# Patient Record
Sex: Male | Born: 1960 | Race: White | Hispanic: No | Marital: Married | Smoking: Former smoker
Health system: Southern US, Community
[De-identification: ages and names within clinical notes are randomized; demographics above are authoritative.]

## PROBLEM LIST (undated history)

## (undated) DIAGNOSIS — C801 Malignant (primary) neoplasm, unspecified: Secondary | ICD-10-CM

## (undated) DIAGNOSIS — L814 Other melanin hyperpigmentation: Secondary | ICD-10-CM

## (undated) DIAGNOSIS — I4892 Unspecified atrial flutter: Secondary | ICD-10-CM

## (undated) DIAGNOSIS — R109 Unspecified abdominal pain: Secondary | ICD-10-CM

## (undated) DIAGNOSIS — C189 Malignant neoplasm of colon, unspecified: Secondary | ICD-10-CM

## (undated) DIAGNOSIS — R634 Abnormal weight loss: Secondary | ICD-10-CM

## (undated) DIAGNOSIS — L0390 Cellulitis, unspecified: Secondary | ICD-10-CM

## (undated) DIAGNOSIS — R0600 Dyspnea, unspecified: Secondary | ICD-10-CM

## (undated) DIAGNOSIS — T7840XA Allergy, unspecified, initial encounter: Secondary | ICD-10-CM

## (undated) DIAGNOSIS — R195 Other fecal abnormalities: Secondary | ICD-10-CM

## (undated) HISTORY — DX: Allergy, unspecified, initial encounter: T78.40XA

## (undated) HISTORY — DX: Cellulitis, unspecified: L03.90

## (undated) HISTORY — DX: Malignant neoplasm of colon, unspecified: C18.9

## (undated) HISTORY — PX: OTHER SURGICAL HISTORY: SHX169

---
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
## 2018-12-11 ENCOUNTER — Encounter: Payer: Self-pay | Admitting: Adult Health

## 2018-12-11 ENCOUNTER — Other Ambulatory Visit: Payer: Self-pay

## 2018-12-11 ENCOUNTER — Ambulatory Visit: Payer: No Typology Code available for payment source | Admitting: Adult Health

## 2018-12-11 VITALS — BP 136/78 | HR 81 | Resp 16 | Ht 76.0 in | Wt 224.0 lb

## 2018-12-11 DIAGNOSIS — R5383 Other fatigue: Secondary | ICD-10-CM | POA: Diagnosis not present

## 2018-12-11 DIAGNOSIS — R1010 Upper abdominal pain, unspecified: Secondary | ICD-10-CM | POA: Diagnosis not present

## 2018-12-11 DIAGNOSIS — R6881 Early satiety: Secondary | ICD-10-CM | POA: Diagnosis not present

## 2018-12-11 NOTE — Progress Notes (Signed)
[facility] [place], [place] [address]  Internal MEDICINE  Office Visit Note  Patient Name: Keith Trujillo  [id number]  [id number]  Date of Service: 12/11/2018   Complaints/HPI Pt is here for establishment of PCP. Chief Complaint  Patient presents with  . Abdominal Pain    after eating having some belly issues and not consistent bowels , been going late june early july , been using immodium , probiotics ,   . New Patient (Initial Visit)    establish care,   . Quality Metric Gaps    colonscopy    HPI Pt is here to establish care.  He is an [employer] [place] native.  He has only really seen urgent care doctors. Feels like its time to establish care. Pt reports he does not take any recurrent medications.  Earlier this year, he had leg cellulitis, and then a tooth abscess.  He reports ongoing gi issues including constipation, early satiety,  and abdominal pain.  This has become worse in the last 4 months, and given his abscense of medical care in the last decade, he decided now was the time to get it checked out. He has been taking multiple otc medications for the past few months.    Current Medication: Outpatient Encounter Medications as of 12/11/2018  Medication Sig  . Lactobacillus-Inulin ([place]) Take by mouth.  . Loperamide-Simethicone 2-125 MG TABS Take by mouth.  . loratadine (ALLERGY RELIEF) 10 MG tablet Take 10 mg by mouth daily.  . Multiple Vitamins-Minerals (CENTRUM SILVER PO) Take by mouth.   No facility-administered encounter medications on file as of 12/11/2018.     Surgical History: Past Surgical History:  Procedure Laterality Date  . TONSILLECTOMY AND ADENOIDECTOMY  1969    Medical History: Past Medical History:  Diagnosis Date  . Allergy   . Cellulitis     Family History: Family History  Problem Relation Age of Onset  . Lung cancer Father     Social History   Socioeconomic History  . Marital status:  Married    Spouse name: Not on file  . Number of children: Not on file  . Years of education: Not on file  . Highest education level: Not on file  Occupational History  . Not on file  Social Needs  . Financial resource strain: Not on file  . Food insecurity    Worry: Not on file    Inability: Not on file  . Transportation needs    Medical: Not on file    Non-medical: Not on file  Tobacco Use  . Smoking status: Light Tobacco Smoker    Types: Cigars  . Smokeless tobacco: Never Used  Substance and Sexual Activity  . Alcohol use: Yes  . Drug use: Never  . Sexual activity: Not on file  Lifestyle  . Physical activity    Days per week: Not on file    Minutes per session: Not on file  . Stress: Not on file  Relationships  . Social Herbalist on phone: Not on file    Gets together: Not on file    Attends religious service: Not on file    Active member of club or organization: Not on file    Attends meetings of clubs or organizations: Not on file    Relationship status: Not on file  . Intimate partner violence    Fear of current or ex partner: Not on file    Emotionally  abused: Not on file    Physically abused: Not on file    Forced sexual activity: Not on file  Other Topics Concern  . Not on file  Social History Narrative  . Not on file     Review of Systems  Constitutional: Negative.  Negative for chills, fatigue and unexpected weight change.  HENT: Negative.  Negative for congestion, rhinorrhea, sneezing and sore throat.   Eyes: Negative for redness.  Respiratory: Negative.  Negative for cough, chest tightness and shortness of breath.   Cardiovascular: Negative.  Negative for chest pain and palpitations.  Gastrointestinal: Negative.  Negative for abdominal pain, constipation, diarrhea, nausea and vomiting.  Endocrine: Negative.   Genitourinary: Negative.  Negative for dysuria and frequency.  Musculoskeletal: Negative.  Negative for arthralgias, back pain,  joint swelling and neck pain.  Skin: Negative.  Negative for rash.  Allergic/Immunologic: Negative.   Neurological: Negative.  Negative for tremors and numbness.  Hematological: Negative for adenopathy. Does not bruise/bleed easily.  Psychiatric/Behavioral: Negative.  Negative for behavioral problems, sleep disturbance and suicidal ideas. The patient is not nervous/anxious.     Vital Signs: BP 136/78 (BP Location: Left Arm, Patient Position: Sitting, Cuff Size: Normal)   Pulse 81   Resp 16   Ht 6\' 4"  (1.93 m)   Wt 224 lb (101.6 kg)   SpO2 99%   BMI 27.27 kg/m    Physical Exam Vitals signs and nursing note reviewed.  Constitutional:      General: He is not in acute distress.    Appearance: He is well-developed. He is not diaphoretic.  HENT:     Head: Normocephalic and atraumatic.     Mouth/Throat:     Pharynx: No oropharyngeal exudate.  Eyes:     Pupils: Pupils are equal, round, and reactive to light.  Neck:     Musculoskeletal: Normal range of motion and neck supple.     Thyroid: No thyromegaly.     Vascular: No JVD.     Trachea: No tracheal deviation.  Cardiovascular:     Rate and Rhythm: Normal rate and regular rhythm.     Heart sounds: Normal heart sounds. No murmur. No friction rub. No gallop.   Pulmonary:     Effort: Pulmonary effort is normal. No respiratory distress.     Breath sounds: Normal breath sounds. No wheezing or rales.  Chest:     Chest wall: No tenderness.  Abdominal:     Palpations: Abdomen is soft.     Tenderness: There is no abdominal tenderness. There is no guarding.  Musculoskeletal: Normal range of motion.  Lymphadenopathy:     Cervical: No cervical adenopathy.  Skin:    General: Skin is warm and dry.  Neurological:     Mental Status: He is alert and oriented to person, place, and time.     Cranial Nerves: No cranial nerve deficit.  Psychiatric:        Behavior: Behavior normal.        Thought Content: Thought content normal.         Judgment: Judgment normal.    Assessment/Plan: 1. Pain of upper abdomen Pt will have referral to GI.  - Ambulatory referral to Gastroenterology  2. Fatigue, unspecified type Pt given lab slip to check labs.   General Counseling: Keith Trujillo verbalizes understanding of the findings of todays visit and agrees with plan of treatment. I have discussed any further diagnostic evaluation that may be needed or ordered today. We also reviewed his medications  today. he has been encouraged to call the office with any questions or concerns that should arise related to todays visit.  No orders of the defined types were placed in this encounter.   No orders of the defined types were placed in this encounter.   Time spent: 35 Minutes   This patient was seen by Orson Gear AGNP-C in Collaboration with Dr Lavera Guise as a part of collaborative care agreement  Kendell Bane AGNP-C Internal Medicine

## 2018-12-13 ENCOUNTER — Other Ambulatory Visit: Payer: Self-pay | Admitting: Adult Health

## 2018-12-14 LAB — CBC WITH DIFFERENTIAL/PLATELET
Basophils Absolute: 0 10*3/uL (ref 0.0–0.2)
Basos: 1 %
EOS (ABSOLUTE): 0.1 10*3/uL (ref 0.0–0.4)
Eos: 1 %
Hematocrit: 39.4 % (ref 37.5–51.0)
Hemoglobin: 13.1 g/dL (ref 13.0–17.7)
Immature Grans (Abs): 0 10*3/uL (ref 0.0–0.1)
Immature Granulocytes: 0 %
Lymphocytes Absolute: 1.2 10*3/uL (ref 0.7–3.1)
Lymphs: 18 %
MCH: 31.4 pg (ref 26.6–33.0)
MCHC: 33.2 g/dL (ref 31.5–35.7)
MCV: 95 fL (ref 79–97)
Monocytes Absolute: 0.4 10*3/uL (ref 0.1–0.9)
Monocytes: 7 %
Neutrophils Absolute: 4.9 10*3/uL (ref 1.4–7.0)
Neutrophils: 73 %
Platelets: 178 10*3/uL (ref 150–450)
RBC: 4.17 x10E6/uL (ref 4.14–5.80)
RDW: 12.1 % (ref 11.6–15.4)
WBC: 6.6 10*3/uL (ref 3.4–10.8)

## 2018-12-14 LAB — COMPREHENSIVE METABOLIC PANEL
ALT: 16 IU/L (ref 0–44)
AST: 15 IU/L (ref 0–40)
Albumin/Globulin Ratio: 2.2 (ref 1.2–2.2)
Albumin: 4.3 g/dL (ref 3.8–4.9)
Alkaline Phosphatase: 92 IU/L (ref 39–117)
BUN/Creatinine Ratio: 13 (ref 9–20)
BUN: 11 mg/dL (ref 6–24)
Bilirubin Total: 0.4 mg/dL (ref 0.0–1.2)
CO2: 26 mmol/L (ref 20–29)
Calcium: 9.6 mg/dL (ref 8.7–10.2)
Chloride: 104 mmol/L (ref 96–106)
Creatinine, Ser: 0.83 mg/dL (ref 0.76–1.27)
GFR calc Af Amer: 112 mL/min/{1.73_m2} (ref >59)
GFR calc non Af Amer: 97 mL/min/{1.73_m2} (ref >59)
Globulin, Total: 2 g/dL (ref 1.5–4.5)
Glucose: 115 mg/dL — ABNORMAL HIGH (ref 65–99)
Potassium: 5 mmol/L (ref 3.5–5.2)
Sodium: 142 mmol/L (ref 134–144)
Total Protein: 6.3 g/dL (ref 6.0–8.5)

## 2018-12-14 LAB — FERRITIN: Ferritin: 161 ng/mL (ref 30–400)

## 2018-12-14 LAB — LIPID PANEL WITH LDL/HDL RATIO
Cholesterol, Total: 161 mg/dL (ref 100–199)
HDL: 46 mg/dL (ref >39)
LDL Chol Calc (NIH): 101 mg/dL — ABNORMAL HIGH (ref 0–99)
LDL/HDL Ratio: 2.2 ratio (ref 0.0–3.6)
Triglycerides: 72 mg/dL (ref 0–149)
VLDL Cholesterol Cal: 14 mg/dL (ref 5–40)

## 2018-12-14 LAB — TSH: TSH: 2.58 u[IU]/mL (ref 0.450–4.500)

## 2018-12-14 LAB — IRON AND TIBC
Iron Saturation: 28 % (ref 15–55)
Iron: 70 ug/dL (ref 38–169)
Total Iron Binding Capacity: 247 ug/dL — ABNORMAL LOW (ref 250–450)
UIBC: 177 ug/dL (ref 111–343)

## 2018-12-14 LAB — B12 AND FOLATE PANEL
Folate: 15.6 ng/mL (ref >3.0)
Vitamin B-12: 926 pg/mL (ref 232–1245)

## 2018-12-14 LAB — PSA: Prostate Specific Ag, Serum: 0.4 ng/mL (ref 0.0–4.0)

## 2018-12-14 LAB — T4, FREE: Free T4: 1.17 ng/dL (ref 0.82–1.77)

## 2018-12-14 LAB — VITAMIN D 25 HYDROXY (VIT D DEFICIENCY, FRACTURES): Vit D, 25-Hydroxy: 27.8 ng/mL — ABNORMAL LOW (ref 30.0–100.0)

## 2018-12-14 LAB — C-REACTIVE PROTEIN: CRP: 3 mg/L (ref 0–10)

## 2018-12-19 ENCOUNTER — Other Ambulatory Visit: Payer: Self-pay

## 2018-12-19 ENCOUNTER — Encounter: Payer: Self-pay | Admitting: Gastroenterology

## 2018-12-19 ENCOUNTER — Ambulatory Visit (INDEPENDENT_AMBULATORY_CARE_PROVIDER_SITE_OTHER): Payer: 59 | Admitting: Gastroenterology

## 2018-12-19 VITALS — BP 106/67 | HR 67 | Temp 98.0°F | Ht 76.0 in | Wt 217.8 lb

## 2018-12-19 DIAGNOSIS — K591 Functional diarrhea: Secondary | ICD-10-CM

## 2018-12-19 NOTE — Progress Notes (Signed)
Gastroenterology Consultation  Referring Provider:     Kendell Bane, NP Primary Care Physician:  Kendell Bane, NP Primary Gastroenterologist:  Dr. Allen Norris     Reason for Consultation:     Diarrhea        HPI:   Keith Trujillo is a 58 y.o. y/o male referred for consultation & management of diarrhea and abdominal pain by Dr. Versie Starks, Audie Clear, NP.  This patient comes here today because of a history of diarrhea.  The patient reports that the symptoms have increased in the last 4 months.  The patient had not sought medical care for many years and was only following up with our care.  He recently established care with Dr. Humphrey Rolls and due to his ongoing GI symptoms was referred to me for evaluation.  The patient was found to have normal iron and iron saturation with a normal CBC and normal LFTs.  The patient had seen a nurse practitioner and his PCPs office who reported the patient to have constipation with early satiety and bloating.  The patient tells me that he has had diarrhea that started a around the time he took antibiotics.  The patient states that he was treating himself with Imodium and tried IBD guard over-the-counter with the addition of Metamucil which he reports made him more gassy.  He reports that he lost some weight because he felt that the eating was making his diarrhea worse but has stabilized.  The patient was also taking probiotics in addition to the Imodium.  He now reports that his stools are more firm and he is now only taking 1 Imodium when before he was taking 2 a day.  He denies any nausea vomiting fever chills.  Past Medical History:  Diagnosis Date  . Allergy   . Cellulitis     Past Surgical History:  Procedure Laterality Date  . TONSILLECTOMY AND ADENOIDECTOMY  1969    Prior to Admission medications   Medication Sig Start Date End Date Taking? Authorizing Provider  Lactobacillus-Inulin ([place]) Take by mouth.    [provider]   Loperamide-Simethicone 2-125 MG TABS Take by mouth.    [provider]  loratadine (ALLERGY RELIEF) 10 MG tablet Take 10 mg by mouth daily.    [provider]  Multiple Vitamins-Minerals (CENTRUM SILVER PO) Take by mouth.    [provider]    Family History  Problem Relation Age of Onset  . Lung cancer Father      Social History   Tobacco Use  . Smoking status: Light Tobacco Smoker    Types: Cigars  . Smokeless tobacco: Never Used  Substance Use Topics  . Alcohol use: Yes  . Drug use: Never    Allergies as of 12/19/2018 - Review Complete 12/11/2018  Allergen Reaction Noted  . Sulfa antibiotics Rash 04/09/2018    Review of Systems:    All systems reviewed and negative except where noted in HPI.   Physical Exam:  There were no vitals taken for this visit. No LMP for male patient. General:   Alert,  Well-developed, well-nourished, pleasant and cooperative in NAD Head:  Normocephalic and atraumatic. Eyes:  Sclera clear, no icterus.   Conjunctiva pink. Ears:  Normal auditory acuity. Nose:  No deformity, discharge, or lesions. Mouth:  No deformity or lesions,oropharynx pink & moist. Neck:  Supple; no masses or thyromegaly. Lungs:  Respirations even and unlabored.  Clear throughout to auscultation.   No wheezes,  crackles, or rhonchi. No acute distress. Heart:  Regular rate and rhythm; no murmurs, clicks, rubs, or gallops. Abdomen:  Normal bowel sounds.  No bruits.  Soft, non-tender and non-distended without masses, hepatosplenomegaly or hernias noted.  No guarding or rebound tenderness.  Negative Carnett sign.   Rectal:  Deferred.  Msk:  Symmetrical without gross deformities.  Good, equal movement & strength bilaterally. Pulses:  Normal pulses noted. Extremities:  No clubbing or edema.  No cyanosis. Neurologic:  Alert and oriented x3;  grossly normal neurologically. Skin:  Intact without significant lesions or rashes.  No jaundice. Lymph Nodes:   No significant cervical adenopathy. Psych:  Alert and cooperative. Normal mood and affect.  Imaging Studies: No results found.  Assessment and Plan:   Keith Trujillo is a 58 y.o. y/o male who comes in today with a history of diarrhea that he reported to be watery and was being treated with probiotics Imodium and IBD guard.  He slowly stopped each 1 of these and has come down to only 1 Imodium in the morning and states that his symptoms are much better.  He is eating well without any concerns of the present time.  There is no report of any black stools or bloody stools.  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  The patient is doing much better now and not having any issues to report.  His abdominal pain has completely resolved.  The patient has never had a screening colonoscopy and will be set up for screening colonoscopy. I have discussed risks & benefits which include, but are not limited to, bleeding, infection, perforation & drug reaction.  The patient agrees with this plan & written consent will be obtained.     [name], MD. Marval Regal    Note: This dictation was prepared with Dragon dictation along with smaller phrase technology. Any transcriptional errors that result from this process are unintentional.

## 2018-12-20 ENCOUNTER — Encounter: Payer: Self-pay | Admitting: Adult Health

## 2018-12-26 ENCOUNTER — Telehealth: Payer: Self-pay | Admitting: Gastroenterology

## 2018-12-26 NOTE — Telephone Encounter (Signed)
Patient has ask that Ginger  call him to schedule a procedure.

## 2018-12-27 ENCOUNTER — Other Ambulatory Visit: Payer: Self-pay

## 2018-12-27 DIAGNOSIS — Z20822 Contact with and (suspected) exposure to covid-19: Secondary | ICD-10-CM

## 2018-12-27 DIAGNOSIS — Z1211 Encounter for screening for malignant neoplasm of colon: Secondary | ICD-10-CM

## 2018-12-27 NOTE — Telephone Encounter (Signed)
Pt returned call and was scheduled for his screening colonoscopy.

## 2018-12-27 NOTE — Telephone Encounter (Signed)
LVM for pt to return my call.

## 2018-12-28 LAB — NOVEL CORONAVIRUS, NAA: SARS-CoV-2, NAA: NOT DETECTED

## 2018-12-30 ENCOUNTER — Other Ambulatory Visit: Payer: Self-pay

## 2018-12-30 ENCOUNTER — Encounter: Payer: Self-pay | Admitting: *Deleted

## 2018-12-30 ENCOUNTER — Other Ambulatory Visit
Admission: RE | Admit: 2018-12-30 | Discharge: 2018-12-30 | Disposition: A | Discharge status: Home / Self Care | Payer: 59 | Source: Ambulatory Visit | Attending: Gastroenterology | Admitting: Gastroenterology

## 2018-12-30 DIAGNOSIS — Z01812 Encounter for preprocedural laboratory examination: Secondary | ICD-10-CM | POA: Diagnosis present

## 2018-12-30 DIAGNOSIS — Z20828 Contact with and (suspected) exposure to other viral communicable diseases: Secondary | ICD-10-CM | POA: Diagnosis not present

## 2018-12-31 LAB — SARS CORONAVIRUS 2 (TAT 6-24 HRS): SARS Coronavirus 2: NEGATIVE

## 2019-01-01 NOTE — Discharge Instructions (Signed)

## 2019-01-01 NOTE — Anesthesia Preprocedure Evaluation (Addendum)
Anesthesia Evaluation  Patient identified by MRN, date of birth, ID band Patient awake    Reviewed: Allergy & Precautions, NPO status , Patient's Chart, lab work & pertinent test results  History of Anesthesia Complications Negative for: history of anesthetic complications  Airway Mallampati: II  TM Distance: >3 FB Neck ROM: Full    Dental   Pulmonary Current Smoker and Patient abstained from smoking.,    breath sounds clear to auscultation       Cardiovascular (-) angina(-) DOE  Rhythm:Regular Rate:Normal     Neuro/Psych    GI/Hepatic neg GERD  ,  Endo/Other    Renal/GU      Musculoskeletal   Abdominal   Peds  Hematology   Anesthesia Other Findings   Reproductive/Obstetrics                            Anesthesia Physical Anesthesia Plan  ASA: I  Anesthesia Plan: General   Post-op Pain Management:    Induction: Intravenous  PONV Risk Score and Plan: 1 and Propofol infusion and TIVA  Airway Management Planned: Natural Airway and Nasal Cannula  Additional Equipment:   Intra-op Plan:   Post-operative Plan:   Informed Consent: I have reviewed the patients History and Physical, chart, labs and discussed the procedure including the risks, benefits and alternatives for the proposed anesthesia with the patient or authorized representative who has indicated his/her understanding and acceptance.       Plan Discussed with: CRNA and Anesthesiologist  Anesthesia Plan Comments:        Anesthesia Quick Evaluation

## 2019-01-02 ENCOUNTER — Ambulatory Visit
Admission: RE | Admit: 2019-01-02 | Discharge: 2019-01-02 | Disposition: A | Discharge status: Home / Self Care | Payer: No Typology Code available for payment source | Attending: Gastroenterology | Admitting: Gastroenterology

## 2019-01-02 ENCOUNTER — Ambulatory Visit: Payer: No Typology Code available for payment source | Admitting: Anesthesiology

## 2019-01-02 ENCOUNTER — Encounter
Admission: RE | Disposition: A | Discharge status: Home / Self Care | Payer: Self-pay | Source: Home / Self Care | Attending: Gastroenterology

## 2019-01-02 ENCOUNTER — Other Ambulatory Visit: Payer: Self-pay

## 2019-01-02 DIAGNOSIS — C187 Malignant neoplasm of sigmoid colon: Secondary | ICD-10-CM | POA: Insufficient documentation

## 2019-01-02 DIAGNOSIS — Z1211 Encounter for screening for malignant neoplasm of colon: Secondary | ICD-10-CM

## 2019-01-02 DIAGNOSIS — D490 Neoplasm of unspecified behavior of digestive system: Secondary | ICD-10-CM | POA: Diagnosis not present

## 2019-01-02 DIAGNOSIS — F17210 Nicotine dependence, cigarettes, uncomplicated: Secondary | ICD-10-CM | POA: Diagnosis not present

## 2019-01-02 HISTORY — PX: COLONOSCOPY WITH PROPOFOL: SHX5780

## 2019-01-02 SURGERY — COLONOSCOPY WITH PROPOFOL
Anesthesia: General | Site: Rectum

## 2019-01-02 MED ORDER — LACTATED RINGERS IV SOLN
100.0000 mL/h | INTRAVENOUS | Status: DC
  Administered 2019-01-02: 100 mL/h via INTRAVENOUS

## 2019-01-02 MED ORDER — LIDOCAINE HCL (CARDIAC) PF 100 MG/5ML IV SOSY
PREFILLED_SYRINGE | INTRAVENOUS | Status: DC | PRN
  Administered 2019-01-02: 30 mg via INTRAVENOUS

## 2019-01-02 MED ORDER — STERILE WATER FOR IRRIGATION IR SOLN
Status: DC | PRN
  Administered 2019-01-02: 100 mL

## 2019-01-02 MED ORDER — PROPOFOL 10 MG/ML IV BOLUS
INTRAVENOUS | Status: DC | PRN
  Administered 2019-01-02 (×2): 40 mg via INTRAVENOUS
  Administered 2019-01-02: 20 mg via INTRAVENOUS
  Administered 2019-01-02: 40 mg via INTRAVENOUS
  Administered 2019-01-02: 30 mg via INTRAVENOUS
  Administered 2019-01-02: 20 mg via INTRAVENOUS
  Administered 2019-01-02: 30 mg via INTRAVENOUS
  Administered 2019-01-02: 50 mg via INTRAVENOUS
  Administered 2019-01-02: 40 mg via INTRAVENOUS
  Administered 2019-01-02: 30 mg via INTRAVENOUS
  Administered 2019-01-02: 120 mg via INTRAVENOUS
  Administered 2019-01-02 (×3): 40 mg via INTRAVENOUS
  Administered 2019-01-02: 20 mg via INTRAVENOUS
  Administered 2019-01-02: 30 mg via INTRAVENOUS

## 2019-01-02 SURGICAL SUPPLY — 12 items
CANISTER SUCT 1200ML W/VALVE (MISCELLANEOUS) ×3 IMPLANT
CLIP HMST 235XBRD CATH ROT (MISCELLANEOUS) ×2 IMPLANT
CLIP RESOLUTION 360 11X235 (MISCELLANEOUS) ×4
FORCEPS BIOP RAD 4 LRG CAP 4 (CUTTING FORCEPS) ×3 IMPLANT
GOWN CVR UNV OPN BCK APRN NK (MISCELLANEOUS) ×2 IMPLANT
GOWN ISOL THUMB LOOP REG UNIV (MISCELLANEOUS) ×4
INJECTOR VARIJECT VIN23 (MISCELLANEOUS) ×1 IMPLANT
KIT ENDO PROCEDURE OLY (KITS) ×3 IMPLANT
MARKER SPOT ENDO TATTOO 5ML (MISCELLANEOUS) ×2 IMPLANT
SPOT EX ENDOSCOPIC TATTOO (MISCELLANEOUS) ×4
VARIJECT INJECTOR VIN23 (MISCELLANEOUS) ×3
WATER STERILE IRR 250ML POUR (IV SOLUTION) ×3 IMPLANT

## 2019-01-02 NOTE — H&P (Signed)
[name], MD [address] [place], [place] [address] Phone: [phone] Fax : [phone]  Primary Care Physician:  Kendell Bane, NP Primary Gastroenterologist:  Dr. Allen Norris  Pre-Procedure History & Physical: HPI:  Keith Trujillo is a 58 y.o. male is here for a screening colonoscopy.   Past Medical History:  Diagnosis Date  . Allergy   . Cellulitis     Past Surgical History:  Procedure Laterality Date  . TONSILLECTOMY AND ADENOIDECTOMY  1969    Prior to Admission medications   Medication Sig Start Date End Date Taking? Authorizing Provider  Lactobacillus-Inulin ([place]) Take by mouth.   Yes [provider]  Loperamide-Simethicone 2-125 MG TABS Take by mouth.   Yes [provider]  loratadine (ALLERGY RELIEF) 10 MG tablet Take 10 mg by mouth daily.   Yes [provider]  Multiple Vitamins-Minerals (CENTRUM SILVER PO) Take by mouth.   Yes [provider]    Allergies as of 12/27/2018 - Review Complete 12/19/2018  Allergen Reaction Noted  . Sulfa antibiotics Rash 04/09/2018    Family History  Problem Relation Age of Onset  . Lung cancer Father     Social History   Socioeconomic History  . Marital status: Married    Spouse name: Not on file  . Number of children: Not on file  . Years of education: Not on file  . Highest education level: Not on file  Occupational History  . Not on file  Social Needs  . Financial resource strain: Not on file  . Food insecurity    Worry: Not on file    Inability: Not on file  . Transportation needs    Medical: Not on file    Non-medical: Not on file  Tobacco Use  . Smoking status: Light Tobacco Smoker    Types: Cigars  . Smokeless tobacco: Never Used  Substance and Sexual Activity  . Alcohol use: Yes    Comment: rare  . Drug use: Never  . Sexual activity: Not on file  Lifestyle  . Physical activity    Days per week: Not on file   Minutes per session: Not on file  . Stress: Not on file  Relationships  . Social Herbalist on phone: Not on file    Gets together: Not on file    Attends religious service: Not on file    Active member of club or organization: Not on file    Attends meetings of clubs or organizations: Not on file    Relationship status: Not on file  . Intimate partner violence    Fear of current or ex partner: Not on file    Emotionally abused: Not on file    Physically abused: Not on file    Forced sexual activity: Not on file  Other Topics Concern  . Not on file  Social History Narrative  . Not on file    Review of Systems: See HPI, otherwise negative ROS  Physical Exam: BP (!) 144/82   Pulse 83   Temp 98.1 F (36.7 C) (Temporal)   Resp 16   Ht 6\' 4"  (1.93 m)   Wt 93.9 kg   SpO2 100%   BMI 25.20 kg/m  General:   Alert,  pleasant and cooperative in NAD Head:  Normocephalic and atraumatic. Neck:  Supple; no masses or thyromegaly. Lungs:  Clear throughout to auscultation.    Heart:  Regular rate and rhythm. Abdomen:  Soft, nontender and nondistended. Normal bowel sounds, without guarding, and without rebound.   Neurologic:  Alert and  oriented x4;  grossly normal neurologically.  Impression/Plan: Keith Trujillo is now here to undergo a screening colonoscopy.  Risks, benefits, and alternatives regarding colonoscopy have been reviewed with the patient.  Questions have been answered.  All parties agreeable.

## 2019-01-02 NOTE — Anesthesia Postprocedure Evaluation (Signed)
Anesthesia Post Note  Patient: Keith Trujillo  Procedure(s) Performed: COLONOSCOPY WITH BIOPSY (N/Trujillo Rectum)  Patient location during evaluation: PACU Anesthesia Type: General Level of consciousness: awake and alert Pain management: pain level controlled Vital Signs Assessment: post-procedure vital signs reviewed and stable Respiratory status: spontaneous breathing, nonlabored ventilation, respiratory function stable and patient connected to nasal cannula oxygen Cardiovascular status: blood pressure returned to baseline and stable Postop Assessment: no apparent nausea or vomiting Anesthetic complications: no    Keith Trujillo  Etsuko Dierolf

## 2019-01-02 NOTE — Transfer of Care (Addendum)
Immediate Anesthesia Transfer of Care Note  Patient: Keith Trujillo  Procedure(s) Performed: COLONOSCOPY WITH BIOPSY (N/A Rectum)  Patient Location: PACU  Anesthesia Type: General  Level of Consciousness: awake, alert  and patient cooperative  Airway and Oxygen Therapy: Patient Spontanous Breathing and Patient connected to supplemental oxygen  Post-op Assessment: Post-op Vital signs reviewed, Patient's Cardiovascular Status Stable, Respiratory Function Stable, Patent Airway and No signs of Nausea or vomiting  Post-op Vital Signs: Reviewed and stable  Complications: No apparent anesthesia complications

## 2019-01-02 NOTE — Op Note (Signed)
[facility] Gastroenterology Patient Name: Keith Trujillo Procedure Date: 01/02/2019 9:51 AM MRN: [id number] Account #: [id number] Date of Birth: 04/18/1960 Admit Type: Outpatient Age: 58 Room: [facility] OR ROOM 01 Gender: Male Note Status: Finalized Procedure:            Colonoscopy Indications:          Screening for colorectal malignant neoplasm Providers:            [name] MD, MD Referring MD:         Lavera Guise, MD (Referring MD) Medicines:            Propofol per Anesthesia Complications:        No immediate complications. Procedure:            Pre-Anesthesia Assessment:                       - Prior to the procedure, a History and Physical was                        performed, and patient medications and allergies were                        reviewed. The patient's tolerance of previous                        anesthesia was also reviewed. The risks and benefits of                        the procedure and the sedation options and risks were                        discussed with the patient. All questions were                        answered, and informed consent was obtained. Prior                        Anticoagulants: The patient has taken no previous                        anticoagulant or antiplatelet agents. ASA Grade                        Assessment: II - A patient with mild systemic disease.                        After reviewing the risks and benefits, the patient was                        deemed in satisfactory condition to undergo the                        procedure.                       After obtaining informed consent, the colonoscope was                        passed under direct vision. Throughout the procedure,  the patient's blood pressure, pulse, and oxygen                        saturations were monitored continuously. The                        Colonoscope was introduced through the anus and       advanced to the the cecum, identified by appendiceal                        orifice and ileocecal valve. The colonoscopy was                        performed without difficulty. The patient tolerated the                        procedure well. The quality of the bowel preparation                        was excellent. Findings:      The perianal and digital rectal examinations were normal.      An ulcerated partially obstructing large mass was found in the sigmoid       colon. The mass was circumferential. The mass measured ten cm in length.       Oozing was present. This was biopsied with a cold forceps for histology.       Area was successfully injected with 6 mL [place] ink for tattooing. The       lesion was tattooed at the proximal and distal margins      The tattoo injection sites bleed more then expected and 2 clips were put       on them. 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 Biopsied. Injected.                       - The tattoo injection sites bleed more then expected                        and 2 clips were put on them. Recommendation:       - Discharge patient to home.                       - Resume previous diet.                       - Continue present medications.                       - Await pathology results.                       - Refer to a Psychologist, sport and exercise. Procedure Code(s):    --- Professional ---                       [phone], Colonoscopy, flexible; with directed submucosal  injection(s), any substance                       45380, Colonoscopy, flexible; with biopsy, single or                        multiple Diagnosis Code(s):    --- Professional ---                       Z12.11, Encounter for screening for malignant neoplasm                        of colon                       D49.0, Neoplasm of unspecified behavior of digestive                        system CPT copyright  2019 American Medical Association. All rights reserved. The codes documented in this report are preliminary and upon coder review may  be revised to meet current compliance requirements. [name] MD, MD 01/02/2019 10:26:26 AM This report has been signed electronically. Number of Addenda: 0 Note Initiated On: 01/02/2019 9:51 AM Scope Withdrawal Time: 0 hours 18 minutes 39 seconds  Total Procedure Duration: 0 hours 23 minutes 10 seconds  Estimated Blood Loss: Estimated blood loss: none.      [facility]

## 2019-01-02 NOTE — Anesthesia Procedure Notes (Signed)
Date/Time: 01/02/2019 9:53 AM Performed by: Cameron Ali, CRNA Pre-anesthesia Checklist: Patient identified, Emergency Drugs available, Suction available, Timeout performed and Patient being monitored Patient Re-evaluated:Patient Re-evaluated prior to induction Oxygen Delivery Method: Nasal cannula Placement Confirmation: positive ETCO2

## 2019-01-03 ENCOUNTER — Encounter: Payer: Self-pay | Admitting: Gastroenterology

## 2019-01-06 ENCOUNTER — Ambulatory Visit: Payer: 59 | Admitting: Surgery

## 2019-01-06 ENCOUNTER — Other Ambulatory Visit: Payer: Self-pay

## 2019-01-06 ENCOUNTER — Encounter: Payer: Self-pay | Admitting: Surgery

## 2019-01-06 VITALS — BP 126/79 | HR 84 | Temp 98.1°F | Resp 14 | Ht 76.0 in | Wt 209.8 lb

## 2019-01-06 DIAGNOSIS — D490 Neoplasm of unspecified behavior of digestive system: Secondary | ICD-10-CM | POA: Diagnosis not present

## 2019-01-06 LAB — SURGICAL PATHOLOGY

## 2019-01-06 NOTE — Patient Instructions (Addendum)
You may go today to [facility] to have your labs drawn, your chest xray done and to pick up your prep kit for you CT. You will enter in through the [facility]. You will have a follow up appointment with Dr.Pabon on 01/15/19 at 10:45 am.   Patient has been scheduled for a CT abdomen/pelvis with/without contrast at [facility] for 01/14/19 at 8:45 am. Prep: NPO 4 hours prior and pick up prep kit. Patient verbalizes understanding.       Colorectal Cancer  Colorectal cancer is an abnormal growth of cells and tissue (tumor) in the colon or rectum, which are parts of the large intestine. The cancer can spread (metastasize) to other parts of the body. What are the causes? Most cases of colorectal cancer start as abnormal growths called polyps on the inner wall of the colon or rectum. Other times, abnormal changes to genes (genetic mutations) can cause cells to form cancer. What increases the risk? You are more likely to develop this condition if:  You are older than age 71.  You have multiple polyps in the colon or rectum.  You have diabetes.  You are African American.  You have a family history of Lynch syndrome.  You have had cancer before.  You have certain hereditary conditions, such as: ? Familial adenomatous polyposis. ? Turcot syndrome. ? Peutz-Jeghers syndrome.  You eat a diet that is high in fat (especially animal fat) and low in fiber, fruits, and vegetables.  You have an inactive (sedentary) lifestyle.  You have an inflammatory bowel disease or Crohn's disease.  You smoke.  You drink alcohol excessively. What are the signs or symptoms? Early colorectal cancer often does not cause symptoms. As the cancer grows, symptoms may include:  Changes in bowel habits.  Feeling like the bowel does not empty completely after a bowel movement.  Stools that are narrower than usual.  Blood in the stool.  Diarrhea.  Constipation.  Anemia.  Discomfort,  pain, bloating, fullness, or cramps in the abdomen.  Frequent gas pain.  Unexplained weight loss.  Constant fatigue.  Nausea and vomiting. How is this diagnosed? This condition may be diagnosed with:  A medical history.  A physical exam.  Tests. These may include: ? A digital rectal exam. ? A stool test called a fecal occult blood test. ? Blood tests. ? A test in which a tissue sample is taken from the colon or rectum and examined under a microscope (biopsy). ? Imaging tests, such as:  X-rays.  A barium enema.  CT scans.  MRIs.  A sigmoidoscopy. This test is done to view the inside of the rectum.  A colonoscopy. This test is done to view the inside of the colon. During this test, small polyps can be removed or biopsies may be taken.  An endorectal ultrasound. This test checks how deep a tumor in the rectum has grown and whether the cancer has spread to lymph nodes or other nearby tissues. Your health care provider may order additional tests to find out whether the cancer has spread to other parts of the body (what stage it is). The stages of cancer include:  Stage 0. At this stage, the cancer is found only in the innermost lining of the colon or rectum.  Stage I. At this stage, the cancer has grown into the inner wall of the colon or rectum.  Stage II. At this stage, the cancer has gone more deeply into the wall of the colon or  rectum or through the wall. It may have invaded nearby tissue.  Stage III. At this stage, the cancer has spread to nearby lymph nodes.  Stage IV. At this stage, the cancer has spread to other parts of the body, such as the liver or lungs. How is this treated? Treatment for this condition depends on the type and stage of the cancer. Treatment may include:  Surgery. In the early stages of the cancer, surgery may be done to remove polyps or small tumors from the colon. In later stages, surgery may be done to remove part of the colon.   Chemotherapy. This treatment uses medicines to kill cancer cells.  Targeted therapy. This treatment targets specific gene mutations or proteins that the cancer expresses in order to kill tumor cells.  Radiation therapy. This treatment uses radiation to kill cancer cells or shrink tumors.  Radiofrequency ablation. This treatment uses radio waves to destroy the tumors that may have spread to other areas of the body, such as the liver. Follow these instructions at home:  Take over-the-counter and prescription medicines only as told by your health care provider.  Try to eat regular, healthy meals. Some of your treatments might affect your appetite. If you are having problems eating or with your appetite, ask to meet with a food and nutrition specialist (dietitian).  Consider joining a support group. This may help you learn about your diagnosis and cope with the stress of having colorectal cancer.  If you are admitted to the hospital, inform your cancer care team.  Keep all follow-up visits as told by your health care provider. This is important. How is this prevented?  Colorectal cancer can be prevented with screening tests that find polyps so they can be removed before they develop into cancer.  All adults should have screening for colorectal cancer starting at age 42 and continuing until age 39. Your health care provider may recommend screening at age 23. People at increased risk should start screening at an earlier age. Where to find more information  American Cancer Society: https://www.cancer.[place] https://www.cancer.gov Contact a health care provider if:  Your diarrhea or constipation does not go away.  You have blood in your stool.  Your bowel habits change.  You have increased pain in your abdomen.  You notice new fatigue or weakness.  You lose weight. Get help right away if:  You have increased bleeding from your rectum.  You have any  uncontrollable or severe abdomen (abdominal) symptoms. Summary  Colorectal cancer is an abnormal growth of cells and tissue (tumor) in the colon or rectum.  Common risk factors for this condition include having a relative with colon cancer, being older in age, having an inflammatory bowel disease, and being African American.  This condition may be diagnosed with tests, such as a colonoscopy and biopsy.  Treatment depends on the type and stage of the cancer. Commonly, treatment includes surgery to remove the tumor along with chemotherapy or targeted therapy.  Keep all follow-up visits as told by your health care provider. This is important. This information is not intended to replace advice given to you by your health care provider. Make sure you discuss any questions you have with your health care provider. Document Released: 03/06/2005 Document Revised: 04/26/2017 Document Reviewed: 04/07/2016 Elsevier Patient Education  2020 [employer].

## 2019-01-07 ENCOUNTER — Ambulatory Visit
Admission: RE | Admit: 2019-01-07 | Discharge: 2019-01-07 | Disposition: A | Discharge status: Home / Self Care | Payer: 59 | Source: Ambulatory Visit | Attending: Surgery | Admitting: Surgery

## 2019-01-07 ENCOUNTER — Other Ambulatory Visit
Admission: RE | Admit: 2019-01-07 | Discharge: 2019-01-07 | Disposition: A | Discharge status: Home / Self Care | Payer: 59 | Source: Ambulatory Visit | Attending: Surgery | Admitting: Surgery

## 2019-01-07 ENCOUNTER — Ambulatory Visit
Admission: RE | Admit: 2019-01-07 | Discharge: 2019-01-07 | Disposition: A | Discharge status: Home / Self Care | Payer: 59 | Attending: Surgery | Admitting: Surgery

## 2019-01-07 ENCOUNTER — Telehealth: Payer: Self-pay

## 2019-01-07 DIAGNOSIS — D490 Neoplasm of unspecified behavior of digestive system: Secondary | ICD-10-CM

## 2019-01-07 NOTE — Telephone Encounter (Signed)
Left message -normal chest xray and reminded of follow up appointmnet 01/15/2019.

## 2019-01-08 LAB — CEA: CEA: 71.7 ng/mL — ABNORMAL HIGH (ref 0.0–4.7)

## 2019-01-09 ENCOUNTER — Encounter: Payer: Self-pay | Admitting: Surgery

## 2019-01-09 NOTE — Progress Notes (Signed)
Patient ID: Keith Trujillo, male   DOB: Mar 06, 1961, 58 y.o.   MRN: [id number]  HPI Keith Trujillo is a 58 y.o. male Keith Trujillo is a 58 year old male recently underwent screening colonoscopy finding a mass at the sigmoid.  Please note that I have personally reviewed the images from Dr.Wohl, and d/w him the case.  He also reports some changes with the bowel movements and now it is pencillike.  Has some intermittent abdominal pain and diarrhea.  He reports that the pain is diffuse in the lower abdomen and crampy type.  No specific alleviating or aggravating factors.  The diarrhea improved with Imodium. He denies any nausea vomiting weight loss or fever.  He is able to perform more than 4 METS of activity without any shortness of breath or chest pain.  CBC and CMP from couple weeks ago was completely normal.  HPI  Past Medical History:  Diagnosis Date  . Allergy   . Cellulitis     Past Surgical History:  Procedure Laterality Date  . COLONOSCOPY WITH PROPOFOL N/A 01/02/2019   Procedure: COLONOSCOPY WITH BIOPSY;  Surgeon: [name], MD;  Location: [facility];  Service: Endoscopy;  Laterality: N/A;  Clip x2 at Biopsy Site  . excision of skin lesion    . TONSILLECTOMY AND ADENOIDECTOMY  1969    Family History  Problem Relation Age of Onset  . Lung cancer Father     Social History Social History   Tobacco Use  . Smoking status: Light Tobacco Smoker    Types: Cigars  . Smokeless tobacco: Never Used  Substance Use Topics  . Alcohol use: Yes    Comment: rare  . Drug use: Never    Allergies  Allergen Reactions  . Sulfa Antibiotics Rash    Current Outpatient Medications  Medication Sig Dispense Refill  . Lactobacillus-Inulin ([place]) Take by mouth.     No current facility-administered medications for this visit.      Review of Systems Full ROS  was asked and was negative except for the information on the HPI  Physical Exam Blood pressure  126/79, pulse 84, temperature 98.1 F (36.7 C), temperature source Temporal, resp. rate 14, height 6\' 4"  (1.93 m), weight 209 lb 12.8 oz (95.2 kg), SpO2 99 %. CONSTITUTIONAL: NAD EYES: Pupils are equal, round, and reactive to light, Sclera are non-icteric. EARS, NOSE, MOUTH AND THROAT: The oropharynx is clear. The oral mucosa is pink and moist. Hearing is intact to voice. LYMPH NODES:  Lymph nodes in the neck are normal. RESPIRATORY:  Lungs are clear. There is normal respiratory effort, with equal breath sounds bilaterally, and without pathologic use of accessory muscles. CARDIOVASCULAR: Heart is regular without murmurs, gallops, or rubs. GI: The abdomen is soft, nontender, and nondistended. There are no palpable masses. There is no hepatosplenomegaly. There are normal bowel sounds in all quadrants. GU: Rectal deferred.   MUSCULOSKELETAL: Normal muscle strength and tone. No cyanosis or edema.   SKIN: Turgor is good and there are no pathologic skin lesions or ulcers. NEUROLOGIC: Motor and sensation is grossly normal. Cranial nerves are grossly intact. PSYCH:  Oriented to person, place and time. Affect is normal.  Data Reviewed  I have personally reviewed the patient's imaging, laboratory findings and medical records.    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  We will start a work-up with a CEA and CT scan of the abdomen pelvis as well as a chest x-ray.  I do  think that he will be a good candidate for elective laparoscopic sigmoid colectomy.  I have discussed with the patient and the family in detail about the treatment of colorectal cancer.  Depending on work-up we will likely proceed with surgery upfront.  Procedure discussed with the patient in detail.  Risk benefit and possible complications.  He understands.  I will see him back next week after he completes his CT scan of the abdomen pelvis as well as a CEA and lab work A copy of this report  was sent to the referring provider [name], MD FACS General Surgeon 01/09/2019, 10:12 AM

## 2019-01-14 ENCOUNTER — Ambulatory Visit: Payer: 59

## 2019-01-15 ENCOUNTER — Ambulatory Visit: Payer: 59 | Admitting: Surgery

## 2019-01-21 ENCOUNTER — Ambulatory Visit
Admission: RE | Admit: 2019-01-21 | Discharge: 2019-01-21 | Disposition: A | Discharge status: Home / Self Care | Payer: 59 | Source: Ambulatory Visit | Attending: Surgery | Admitting: Surgery

## 2019-01-21 ENCOUNTER — Other Ambulatory Visit: Payer: Self-pay

## 2019-01-21 DIAGNOSIS — D490 Neoplasm of unspecified behavior of digestive system: Secondary | ICD-10-CM | POA: Diagnosis not present

## 2019-01-21 HISTORY — DX: Malignant (primary) neoplasm, unspecified: C80.1

## 2019-01-21 MED ORDER — IOHEXOL 300 MG/ML  SOLN
100.0000 mL | Freq: Once | INTRAMUSCULAR | Status: AC | PRN
  Administered 2019-01-21: 100 mL via INTRAVENOUS

## 2019-01-22 ENCOUNTER — Encounter: Payer: 59 | Admitting: Adult Health

## 2019-01-27 ENCOUNTER — Encounter: Payer: Self-pay | Admitting: Surgery

## 2019-01-27 ENCOUNTER — Other Ambulatory Visit: Payer: Self-pay

## 2019-01-27 ENCOUNTER — Ambulatory Visit (INDEPENDENT_AMBULATORY_CARE_PROVIDER_SITE_OTHER): Payer: 59 | Admitting: Surgery

## 2019-01-27 VITALS — BP 144/82 | HR 62 | Temp 97.9°F | Resp 14 | Ht 76.0 in | Wt 204.0 lb

## 2019-01-27 DIAGNOSIS — C187 Malignant neoplasm of sigmoid colon: Secondary | ICD-10-CM

## 2019-01-27 MED ORDER — BISACODYL 5 MG PO TBEC: DELAYED_RELEASE_TABLET | ORAL | 0 refills | Status: DC

## 2019-01-27 MED ORDER — POLYETHYLENE GLYCOL 3350 17 GM/SCOOP PO POWD: ORAL | 0 refills | Status: DC

## 2019-01-27 MED ORDER — ERYTHROMYCIN BASE 500 MG PO TABS: ORAL_TABLET | ORAL | 0 refills | Status: DC

## 2019-01-27 MED ORDER — NEOMYCIN SULFATE 500 MG PO TABS: ORAL_TABLET | ORAL | 0 refills | Status: DC

## 2019-01-27 NOTE — Patient Instructions (Addendum)
Bowel prep and Blue surgery form given. Our surgery scheduler will call you to schedule.   Your prescriptions have been sent into your pharmacy.

## 2019-01-28 ENCOUNTER — Other Ambulatory Visit: Payer: Self-pay

## 2019-01-28 ENCOUNTER — Encounter: Payer: Self-pay | Admitting: Surgery

## 2019-01-28 DIAGNOSIS — D490 Neoplasm of unspecified behavior of digestive system: Secondary | ICD-10-CM

## 2019-01-28 NOTE — H&P (View-Only) (Signed)
Outpatient Surgical Follow Up  01/28/2019  Keith Trujillo is an 58 y.o. male.   Chief Complaint  Patient presents with  . Follow-up    HPI: 58 year old male seen back for a history of sigmoid cancer.  I once again reviewed the colonoscopy personally and the pathology.  Pathology consistent with adenocarcinoma.  Colonoscopy consistent with a near obstructing sigmoid mass that has a total length of 10 cm.  I ordered the CT scan of the abdomen and pelvis as well as a chest x-ray for staging purposes.  CT scan I have personally reviewed showing evidence of distal sigmoid mass with circumferential thickening.  I do see the clips that GI placed.  There is a hypodense lesion in the liver that I do not think is a metastatic disease.  He does have an increase in the CEA.  He likely has nodal extension. He endorses change in bowel habits and some mild lower abdominal pain.  Past Medical History:  Diagnosis Date  . Allergy   . Cancer ([place])   . Cellulitis     Past Surgical History:  Procedure Laterality Date  . COLONOSCOPY WITH PROPOFOL N/A 01/02/2019   Procedure: COLONOSCOPY WITH BIOPSY;  Surgeon: [name], MD;  Location: [facility];  Service: Endoscopy;  Laterality: N/A;  Clip x2 at Biopsy Site  . excision of skin lesion    . TONSILLECTOMY AND ADENOIDECTOMY  1969    Family History  Problem Relation Age of Onset  . Lung cancer Father     Social History:  reports that he has been smoking cigars. He has never used smokeless tobacco. He reports current alcohol use. He reports that he does not use drugs.  Allergies:  Allergies  Allergen Reactions  . Sulfa Antibiotics Rash    Medications reviewed.    ROS Full ROS performed and is otherwise negative other than what is stated in HPI   BP (!) 144/82   Pulse 62   Temp 97.9 F (36.6 C)   Resp 14   Ht 6\' 4"  (1.93 m)   Wt 204 lb (92.5 kg)   SpO2 98%   BMI 24.83 kg/m   Physical Exam Vitals signs and nursing note  reviewed. Exam conducted with a chaperone present.  Constitutional:      Appearance: Normal appearance. He is normal weight.  Eyes:     General: No scleral icterus.       Right eye: No discharge.        Left eye: No discharge.  Neck:     Musculoskeletal: Normal range of motion and neck supple. No neck rigidity or muscular tenderness.  Cardiovascular:     Rate and Rhythm: Normal rate and regular rhythm.  Pulmonary:     Effort: Pulmonary effort is normal. No respiratory distress.     Breath sounds: No stridor. No wheezing.  Abdominal:     General: Abdomen is flat. There is no distension.     Palpations: There is no mass.     Tenderness: There is no abdominal tenderness. There is no guarding.     Hernia: No hernia is present.  Genitourinary:    Comments: I was able to place my 3rd finger and felt the very tip of the mass c/w distal sigmoid mass, no bleeding. Musculoskeletal: Normal range of motion.        General: No swelling.  Skin:    General: Skin is warm.     Capillary Refill: Capillary refill takes less than 2 seconds.  Neurological:     General: No focal deficit present.     Mental Status: He is alert and oriented to person, place, and time.  Psychiatric:        Mood and Affect: Mood normal.        Behavior: Behavior normal.        Thought Content: Thought content normal.        Judgment: Judgment normal.    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  No evidence of obstruction.  No evidence of distant metastatic disease.  We will go ahead and schedule him for a laparoscopic sigmoid colectomy/ LAR .  Procedure discussed with the patient in detail.  Risk, benefits and possible complications including but not limited to: Bleeding, infection, anastomotic leak, need for ostomy, chronic pain.  He understands and wishes to proceed.  I am also going to ask oncology to see him. I do think that this  is a resectable lesion and he will benefit from upfront surgery he will likely require chemotherapy postoperatively.   Greater than 50% of the 40 minutes  visit was spent in counseling/coordination of care   [name], MD [facility] Surgeon

## 2019-01-28 NOTE — Progress Notes (Signed)
Outpatient Surgical Follow Up  01/28/2019  Sathvik Giza is an 58 y.o. male.   Chief Complaint  Patient presents with  . Follow-up    HPI: 58 year old male seen back for a history of sigmoid cancer.  I once again reviewed the colonoscopy personally and the pathology.  Pathology consistent with adenocarcinoma.  Colonoscopy consistent with a near obstructing sigmoid mass that has a total length of 10 cm.  I ordered the CT scan of the abdomen and pelvis as well as a chest x-ray for staging purposes.  CT scan I have personally reviewed showing evidence of distal sigmoid mass with circumferential thickening.  I do see the clips that GI placed.  There is a hypodense lesion in the liver that I do not think is a metastatic disease.  He does have an increase in the CEA.  He likely has nodal extension. He endorses change in bowel habits and some mild lower abdominal pain.  Past Medical History:  Diagnosis Date  . Allergy   . Cancer ([place])   . Cellulitis     Past Surgical History:  Procedure Laterality Date  . COLONOSCOPY WITH PROPOFOL N/A 01/02/2019   Procedure: COLONOSCOPY WITH BIOPSY;  Surgeon: [name], MD;  Location: [facility];  Service: Endoscopy;  Laterality: N/A;  Clip x2 at Biopsy Site  . excision of skin lesion    . TONSILLECTOMY AND ADENOIDECTOMY  1969    Family History  Problem Relation Age of Onset  . Lung cancer Father     Social History:  reports that he has been smoking cigars. He has never used smokeless tobacco. He reports current alcohol use. He reports that he does not use drugs.  Allergies:  Allergies  Allergen Reactions  . Sulfa Antibiotics Rash    Medications reviewed.    ROS Full ROS performed and is otherwise negative other than what is stated in HPI   BP (!) 144/82   Pulse 62   Temp 97.9 F (36.6 C)   Resp 14   Ht 6\' 4"  (1.93 m)   Wt 204 lb (92.5 kg)   SpO2 98%   BMI 24.83 kg/m   Physical Exam Vitals signs and nursing note  reviewed. Exam conducted with a chaperone present.  Constitutional:      Appearance: Normal appearance. He is normal weight.  Eyes:     General: No scleral icterus.       Right eye: No discharge.        Left eye: No discharge.  Neck:     Musculoskeletal: Normal range of motion and neck supple. No neck rigidity or muscular tenderness.  Cardiovascular:     Rate and Rhythm: Normal rate and regular rhythm.  Pulmonary:     Effort: Pulmonary effort is normal. No respiratory distress.     Breath sounds: No stridor. No wheezing.  Abdominal:     General: Abdomen is flat. There is no distension.     Palpations: There is no mass.     Tenderness: There is no abdominal tenderness. There is no guarding.     Hernia: No hernia is present.  Genitourinary:    Comments: I was able to place my 3rd finger and felt the very tip of the mass c/w distal sigmoid mass, no bleeding. Musculoskeletal: Normal range of motion.        General: No swelling.  Skin:    General: Skin is warm.     Capillary Refill: Capillary refill takes less than 2 seconds.  Neurological:     General: No focal deficit present.     Mental Status: He is alert and oriented to person, place, and time.  Psychiatric:        Mood and Affect: Mood normal.        Behavior: Behavior normal.        Thought Content: Thought content normal.        Judgment: Judgment normal.    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  No evidence of obstruction.  No evidence of distant metastatic disease.  We will go ahead and schedule him for a laparoscopic sigmoid colectomy/ LAR .  Procedure discussed with the patient in detail.  Risk, benefits and possible complications including but not limited to: Bleeding, infection, anastomotic leak, need for ostomy, chronic pain.  He understands and wishes to proceed.  I am also going to ask oncology to see him. I do think that this  is a resectable lesion and he will benefit from upfront surgery he will likely require chemotherapy postoperatively.   Greater than 50% of the 40 minutes  visit was spent in counseling/coordination of care   [name], MD [facility] Surgeon

## 2019-01-29 ENCOUNTER — Telehealth: Payer: Self-pay | Admitting: Surgery

## 2019-01-29 NOTE — Telephone Encounter (Signed)
I have called patient to go over the information below. No answer. I have left a message on patient's voicemail to call office.   Surgery Date: 02/06/19 with Dr Marlan Palau sigmoid colectomy possible ostomy.  Preadmission Testing Date: 02/03/19 @ 10:30am-office interview-arrive at the [facility] entrance.  Covid Testing Date: following pre admit appointment - patient advised to go to [facility])  [employer] Video sent via [employer] [facility] and [employer].  Patient has been made aware to call [phone], between 1-3:00pm the day before surgery, to find out what time to arrive.    I will mail the bowel prep instructions as well.

## 2019-01-30 NOTE — Telephone Encounter (Signed)
Patient has called back and all surgery information has been discussed. Patient understands.

## 2019-01-31 ENCOUNTER — Inpatient Hospital Stay: Payer: 59 | Attending: Oncology | Admitting: Oncology

## 2019-01-31 ENCOUNTER — Encounter: Payer: Self-pay | Admitting: Oncology

## 2019-01-31 ENCOUNTER — Other Ambulatory Visit: Payer: Self-pay

## 2019-01-31 VITALS — BP 123/74 | HR 57 | Temp 98.0°F | Resp 16 | Ht 76.0 in | Wt 201.6 lb

## 2019-01-31 DIAGNOSIS — R932 Abnormal findings on diagnostic imaging of liver and biliary tract: Secondary | ICD-10-CM

## 2019-01-31 DIAGNOSIS — Z803 Family history of malignant neoplasm of breast: Secondary | ICD-10-CM | POA: Diagnosis not present

## 2019-01-31 DIAGNOSIS — C187 Malignant neoplasm of sigmoid colon: Secondary | ICD-10-CM | POA: Insufficient documentation

## 2019-01-31 DIAGNOSIS — D490 Neoplasm of unspecified behavior of digestive system: Secondary | ICD-10-CM

## 2019-01-31 DIAGNOSIS — K769 Liver disease, unspecified: Secondary | ICD-10-CM | POA: Insufficient documentation

## 2019-01-31 DIAGNOSIS — Z79899 Other long term (current) drug therapy: Secondary | ICD-10-CM | POA: Diagnosis not present

## 2019-01-31 DIAGNOSIS — Z87891 Personal history of nicotine dependence: Secondary | ICD-10-CM

## 2019-01-31 DIAGNOSIS — C189 Malignant neoplasm of colon, unspecified: Secondary | ICD-10-CM

## 2019-01-31 DIAGNOSIS — R970 Elevated carcinoembryonic antigen [CEA]: Secondary | ICD-10-CM | POA: Diagnosis not present

## 2019-01-31 DIAGNOSIS — Z801 Family history of malignant neoplasm of trachea, bronchus and lung: Secondary | ICD-10-CM | POA: Diagnosis not present

## 2019-01-31 DIAGNOSIS — F17210 Nicotine dependence, cigarettes, uncomplicated: Secondary | ICD-10-CM | POA: Diagnosis not present

## 2019-01-31 NOTE — Progress Notes (Signed)
Surgery has now been scheduled for 02/06/19 with Dr. Dahlia Byes.

## 2019-02-03 ENCOUNTER — Other Ambulatory Visit: Payer: Self-pay | Admitting: *Deleted

## 2019-02-03 ENCOUNTER — Encounter: Payer: Self-pay | Admitting: Oncology

## 2019-02-03 ENCOUNTER — Other Ambulatory Visit: Payer: Self-pay

## 2019-02-03 ENCOUNTER — Encounter
Admission: RE | Admit: 2019-02-03 | Discharge: 2019-02-03 | Disposition: A | Discharge status: Home / Self Care | Payer: No Typology Code available for payment source | Source: Ambulatory Visit | Attending: Surgery | Admitting: Surgery

## 2019-02-03 DIAGNOSIS — Z01810 Encounter for preprocedural cardiovascular examination: Secondary | ICD-10-CM | POA: Insufficient documentation

## 2019-02-03 DIAGNOSIS — C189 Malignant neoplasm of colon, unspecified: Secondary | ICD-10-CM

## 2019-02-03 DIAGNOSIS — R932 Abnormal findings on diagnostic imaging of liver and biliary tract: Secondary | ICD-10-CM

## 2019-02-03 DIAGNOSIS — I4430 Unspecified atrioventricular block: Secondary | ICD-10-CM | POA: Insufficient documentation

## 2019-02-03 DIAGNOSIS — Z20828 Contact with and (suspected) exposure to other viral communicable diseases: Secondary | ICD-10-CM | POA: Insufficient documentation

## 2019-02-03 DIAGNOSIS — Z01812 Encounter for preprocedural laboratory examination: Secondary | ICD-10-CM | POA: Insufficient documentation

## 2019-02-03 HISTORY — DX: Unspecified abdominal pain: R10.9

## 2019-02-03 HISTORY — DX: Other fecal abnormalities: R19.5

## 2019-02-03 HISTORY — DX: Dyspnea, unspecified: R06.00

## 2019-02-03 HISTORY — DX: Abnormal weight loss: R63.4

## 2019-02-03 HISTORY — DX: Other melanin hyperpigmentation: L81.4

## 2019-02-03 LAB — SARS CORONAVIRUS 2 (TAT 6-24 HRS): SARS Coronavirus 2: NEGATIVE

## 2019-02-03 NOTE — Patient Instructions (Addendum)
Your procedure is scheduled on: 02/06/2019 Thur Report to Same Day Surgery 2nd floor medical mall [facility] Entrance-take elevator on left to 2nd floor.  Check in with surgery information desk.) To find out your arrival time please call [phone] between 1PM - 3PM on 02/05/2019 Wed  Remember: Instructions that are not followed completely may result in serious medical risk, up to and including death, or upon the discretion of your surgeon and anesthesiologist your surgery may need to be rescheduled.    _x___ 1. Do not eat food after midnight the night before your procedure. You may drink clear liquids up to 2 hours before you are scheduled to arrive at the hospital for your procedure.  Do not drink clear liquids within 2 hours of your scheduled arrival to the hospital.  Clear liquids include  --Water or Apple juice without pulp  --Clear carbohydrate beverage such as ClearFast or Gatorade  --Black Coffee or Clear Tea (No milk, no creamers, do not add anything to                  the coffee or Tea Type 1 and type 2 diabetics should only drink water.   ____Ensure clear carbohydrate drink on the way to the hospital for bariatric patients  ____Ensure clear carbohydrate drink 3 hours before surgery.   No gum chewing or hard candies.     __x__ 2. No Alcohol for 24 hours before or after surgery.   __x__3. No Smoking or e-cigarettes for 24 prior to surgery.  Do not use any chewable tobacco products for at least 6 hour prior to surgery   ____  4. Bring all medications with you on the day of surgery if instructed.    __x__ 5. Notify your doctor if there is any change in your medical condition     (cold, fever, infections).    x___6. On the morning of surgery brush your teeth with toothpaste and water.  You may rinse your mouth with mouth wash if you wish.  Do not swallow any toothpaste or mouthwash.   Do not wear jewelry, make-up, hairpins, clips or nail polish.  Do not wear lotions,  powders, or perfumes. You may wear deodorant.  Do not shave 48 hours prior to surgery. Men may shave face and neck.  Do not bring valuables to the hospital.    [facility] is not responsible for any belongings or valuables.               Contacts, dentures or bridgework may not be worn into surgery.  Leave your suitcase in the car. After surgery it may be brought to your room.  For patients admitted to the hospital, discharge time is determined by your                       treatment team.  _  Patients discharged the day of surgery will not be allowed to drive home.  You will need someone to drive you home and stay with you the night of your procedure.    Please read over the following fact sheets that you were given:   [facility] Preparing for Surgery and or MRSA Information   _x___ Take anti-hypertensive listed below, cardiac, seizure, asthma,     anti-reflux and psychiatric medicines. These include:  1. Medications instructed by surgeon  2.  3.  4.  5.  6.  ____Fleets enema or Magnesium Citrate as directed.   _x___ Use  CHG Soap or sage wipes as directed on instruction sheet   ____ Use inhalers on the day of surgery and bring to hospital day of surgery  ____ Stop Metformin and Janumet 2 days prior to surgery.    ____ Take 1/2 of usual insulin dose the night before surgery and none on the morning     surgery.   _x___ Follow recommendations from Cardiologist, Pulmonologist or PCP regarding          stopping Aspirin, Coumadin, Plavix ,Eliquis, Effient, or Pradaxa, and Pletal.  X____Stop Anti-inflammatories such as Advil, Aleve, Ibuprofen, Motrin, Naproxen, Naprosyn, Goodies powders or aspirin products. OK to take Tylenol and                          Celebrex.   _x___ Stop supplements until after surgery.  But may continue Vitamin D, Vitamin B,       and multivitamin.   ____ Bring C-Pap to the hospital.

## 2019-02-03 NOTE — Progress Notes (Signed)
Hematology/Oncology Consult note [facility] [phone] [phone]  Patient Care Team: Kendell Bane, NP as PCP - General (Adult Health Nurse Practitioner) Clent Jacks, RN as Oncology Nurse Navigator   Name of the patient: Keith Trujillo  CB:[id number]  05-Apr-1960    Reason for referral-new diagnosis of colon cancer   Referring physician- Dr. Dahlia Byes  Date of visit: 02/03/19   History of presenting illness- patient is a 58 year old male who was seen by Dr. Leonides Schanz from GI for symptoms of diarrhea as well as abdominal pain.He underwent a colonoscopy on 01/02/2019 which showed an ulcerated partially obstructing large mass in the sigmoid colon which measured 10 cm in length biopsy showed adenocarcinoma moderately differentiated.  Baseline CEA elevated at 71.7.  He was seen by Dr. Dahlia Byes and will be undergoing laparoscopic hemicolectomy and lymph node sampling next week.  CT abdomen pelvis with contrast showed irregular marked circumferential wall thickening in the sigmoid colon near the rectosigmoid junction.  The lesion longitudinally extends for approximately 5 to 6 cm.  4.1 x 2.8 cm ill-defined irregular paracolonic soft tissue mass may represent direct tumor extension or contiguous markedly enlarged lymph node.  Abnormal lymph nodes also seen in the perirectal and presacral space concerning for metastatic disease.  1.5 cm ill-defined hypoattenuating lesion in the medial segment of the left liver which may be focal fatty deposition but cannot be definitively characterized.  Patient reports no significant unintentional weight loss.  Denies any family history of colon cancer.  He does have a family history of breast cancer in a sister.  ECOG PS- 0  Pain scale- 0   Review of systems- Review of Systems  Constitutional: Negative for chills, fever, malaise/fatigue and weight loss.  HENT: Negative for congestion, ear discharge and nosebleeds.     Eyes: Negative for blurred vision.  Respiratory: Negative for cough, hemoptysis, sputum production, shortness of breath and wheezing.   Cardiovascular: Negative for chest pain, palpitations, orthopnea and claudication.  Gastrointestinal: Positive for abdominal pain and diarrhea. Negative for blood in stool, constipation, heartburn, melena, nausea and vomiting.  Genitourinary: Negative for dysuria, flank pain, frequency, hematuria and urgency.  Musculoskeletal: Negative for back pain, joint pain and myalgias.  Skin: Negative for rash.  Neurological: Negative for dizziness, tingling, focal weakness, seizures, weakness and headaches.  Endo/Heme/Allergies: Does not bruise/bleed easily.  Psychiatric/Behavioral: Negative for depression and suicidal ideas. The patient does not have insomnia.     Allergies  Allergen Reactions   Sulfa Antibiotics Rash    Patient Active Problem List   Diagnosis Date Noted   Special screening for malignant neoplasms, colon    Neoplasm of digestive system      Past Medical History:  Diagnosis Date   Allergy    Cancer ([place])    Cellulitis      Past Surgical History:  Procedure Laterality Date   COLONOSCOPY WITH PROPOFOL N/A 01/02/2019   Procedure: COLONOSCOPY WITH BIOPSY;  Surgeon: [name], MD;  Location: [facility];  Service: Endoscopy;  Laterality: N/A;  Clip x2 at Biopsy Site   excision of skin lesion     TONSILLECTOMY AND ADENOIDECTOMY  1969    Social History   Socioeconomic History   Marital status: Married    Spouse name: Not on file   Number of children: Not on file   Years of education: Not on file   Highest education level: Not on file  Occupational History   Not on file  Social Needs  Financial resource strain: Not on file   Food insecurity    Worry: Not on file    Inability: Not on file   Transportation needs    Medical: Not on file    Non-medical: Not on file  Tobacco Use   Smoking status:  Light Tobacco Smoker    Types: Cigars   Smokeless tobacco: Former Systems developer    Quit date: 1980  Substance and Sexual Activity   Alcohol use: Yes    Comment: rare   Drug use: Never   Sexual activity: Yes  Lifestyle   Physical activity    Days per week: Not on file    Minutes per session: Not on file   Stress: Not on file  Relationships   Social connections    Talks on phone: Not on file    Gets together: Not on file    Attends religious service: Not on file    Active member of club or organization: Not on file    Attends meetings of clubs or organizations: Not on file    Relationship status: Not on file   Intimate partner violence    Fear of current or ex partner: Not on file    Emotionally abused: Not on file    Physically abused: Not on file    Forced sexual activity: Not on file  Other Topics Concern   Not on file  Social History Narrative   Not on file     Family History  Problem Relation Age of Onset   Lung cancer Father      Current Outpatient Medications:    bisacodyl (DULCOLAX) 5 MG EC tablet, Take all 4 tablets at 8 am the morning prior to your surgery., Disp: 4 tablet, Rfl: 0   erythromycin base (E-MYCIN) 500 MG tablet, Take 2 tablets at 8am, 2 tablets at 2pm, and 2 tablets at 8pm the day prior to surgery., Disp: 6 tablet, Rfl: 0   Loperamide HCl (IMODIUM PO), Take 2 mg by mouth 2 (two) times daily as needed (diarrhea or loose stools). , Disp: , Rfl:    neomycin (MYCIFRADIN) 500 MG tablet, Take 2 tablet at 8am, take 2 tablets at 2pm, and take 2 tablets at 8pm the day prior to your surgery, Disp: 6 tablet, Rfl: 0   polyethylene glycol powder (MIRALAX) 17 GM/SCOOP powder, Mix full container in 64 ounces of Gatorade or other clear liquid. NO RED LIQUIDS, Disp: 238 g, Rfl: 0   Physical exam:  Vitals:   01/31/19 1503  BP: 123/74  Pulse: (!) 57  Resp: 16  Temp: 98 F (36.7 C)  TempSrc: Tympanic  Weight: 201 lb 9.6 oz (91.4 kg)  Height: 6\' 4"   (1.93 m)   Physical Exam Constitutional:      General: He is not in acute distress. HENT:     Head: Normocephalic and atraumatic.  Eyes:     Pupils: Pupils are equal, round, and reactive to light.  Neck:     Musculoskeletal: Normal range of motion.  Cardiovascular:     Rate and Rhythm: Normal rate and regular rhythm.     Heart sounds: Normal heart sounds.  Pulmonary:     Effort: Pulmonary effort is normal.     Breath sounds: Normal breath sounds.  Abdominal:     General: Bowel sounds are normal.     Palpations: Abdomen is soft.     Comments: Mild tenderness to palpation in the left lower quadrant  Skin:    General:  Skin is warm and dry.  Neurological:     Mental Status: He is alert and oriented to person, place, and time.        CMP Latest Ref Rng & Units 12/13/2018  Glucose 65 - 99 mg/dL 115(H)  BUN 6 - 24 mg/dL 11  Creatinine 0.76 - 1.27 mg/dL 0.83  Sodium 134 - 144 mmol/L 142  Potassium 3.5 - 5.2 mmol/L 5.0  Chloride 96 - 106 mmol/L 104  CO2 20 - 29 mmol/L 26  Calcium 8.7 - 10.2 mg/dL 9.6  Total Protein 6.0 - 8.5 g/dL 6.3  Total Bilirubin 0.0 - 1.2 mg/dL 0.4  Alkaline Phos 39 - 117 IU/L 92  AST 0 - 40 IU/L 15  ALT 0 - 44 IU/L 16   CBC Latest Ref Rng & Units 12/13/2018  WBC 3.4 - 10.8 x10E3/uL 6.6  Hemoglobin 13.0 - 17.7 g/dL 13.1  Hematocrit 37.5 - 51.0 % 39.4  Platelets 150 - 450 x10E3/uL 178    No images are attached to the encounter.  Dg Chest 2 View  Result Date: 01/07/2019 CLINICAL DATA:  Preop for colon cancer surgery. EXAM: CHEST - 2 VIEW COMPARISON:  None. FINDINGS: The cardiac silhouette, mediastinal and hilar contours are within normal limits. The lungs are clear of an acute process. No worrisome pulmonary lesions are identified. No pleural effusions. The bony thorax is intact. Moderate degenerative changes involving the thoracic spine. IMPRESSION: No acute cardiopulmonary findings or pulmonary lesions. Electronically Signed   By: Marijo Sanes M.D.    On: 01/07/2019 08:48   Ct Abdomen Pelvis W Contrast  Result Date: 01/21/2019 CLINICAL DATA:  Colon cancer. Staging. EXAM: CT ABDOMEN AND PELVIS WITH CONTRAST TECHNIQUE: Multidetector CT imaging of the abdomen and pelvis was performed using the standard protocol following bolus administration of intravenous contrast. CONTRAST:  147mL OMNIPAQUE IOHEXOL 300 MG/ML  SOLN COMPARISON:  None. FINDINGS: Lower chest: Unremarkable. Hepatobiliary: 1.5 cm ill-defined hypoattenuating lesion is identified in the medial segment left liver. This may be focal fatty deposition but cannot be definitively characterized. Gallbladder decompressed. No intrahepatic or extrahepatic biliary dilation. Pancreas: No focal mass lesion. No dilatation of the main duct. No intraparenchymal cyst. No peripancreatic edema. Spleen: No splenomegaly. No focal mass lesion. Adrenals/Urinary Tract: No adrenal nodule or mass. Kidneys unremarkable. No evidence for hydroureter. The urinary bladder appears normal for the degree of distention. Stomach/Bowel: Stomach is unremarkable. No gastric wall thickening. No evidence of outlet obstruction. Duodenum is normally positioned as is the ligament of Treitz. No small bowel wall thickening. No small bowel dilatation. The terminal ileum is normal. The appendix is normal. Right colon, transverse colon, and descending colon are unremarkable. Irregular marked circumferential wall thickening noted in the distal sigmoid colon near the rectosigmoid junction. Lesion extends longitudinally for a distance of approximately 5-6 cm. 4.1 x 2.8 cm ill-defined irregular para colonic soft tissue mass (image 64/series 2) may represent direct tumor extension or a contiguous markedly enlarged lymph node. Additional abnormal lymph nodes are seen in the perirectal fat and presacral space, consistent with metastatic disease. Hemostatic or localization clip noted in the rectum. Vascular/Lymphatic: No abdominal aortic aneurysm. No  gastrohepatic or hepato duodenal ligament lymphadenopathy. No para-aortic lymphadenopathy. Please see above for abnormal lymph nodes in the pelvis. Reproductive: The prostate gland and seminal vesicles are unremarkable. Other: No intraperitoneal free fluid. Musculoskeletal: No worrisome lytic or sclerotic osseous abnormality. IMPRESSION: 1. Marked irregular circumferential wall thickening in the distal sigmoid colon near the rectosigmoid junction compatible with  the patient's known primary malignancy. 2. 4.1 x 2.8 cm ill-defined irregular para colonic soft tissue mass may represent direct tumor extension or contiguous markedly enlarged lymph node. 3. Additional abnormal lymph nodes in the perirectal fat and presacral space, consistent with metastatic disease. 4. 1.5 cm ill-defined hypoattenuating lesion in the medial segment left liver. This may be focal fatty deposition but cannot be definitively characterized. MRI without and with contrast may prove helpful to further evaluate as metastatic disease not excluded. Electronically Signed   By: Misty Stanley M.D.   On: 01/21/2019 09:24    Assessment and plan- Patient is a 58 y.o. male referred for new diagnosis of colon cancer  I have reviewed this CT abdomen and pelvis images independently and discussed findings with the patient.  Also discussed the results of colonoscopy.  Patient found to have a near obstructing sigmoid colon mass which was biopsy-proven moderately differentiated adenocarcinoma  I have discussed this case with Dr. Dahlia Byes and given the near obstruction he would need surgery ASAP which is being scheduled next week.  I will obtain CT chest with contrast to complete his staging work-up.  Patient was also noted to have 1.5 cm ill-defined lesion in his liver which I would like to get an MRI for to rule out metastatic disease.  I would also like to add MRI pelvis to better characterize the presacral lymph nodes.  Based on colonoscopy findings of  mass seems to be in the sigmoid colon although it is unclear if there is any rectal involvement.  I will await final surgery to see if this was involving the rectum in any way.  Ideally for rectal cancers patient would benefit from neoadjuvant chemotherapy.  However given the near obstruction and questionable involvement of the rectum he is going for surgery upfront.  If there is rectal involvement he would also benefit from adjuvant radiation treatment.  Overall CT abdomen findings are concerning for at least stage III colon cancer given the enlarged locoregional lymph nodes and would benefit from adjuvant chemotherapy which I will discuss with him in greater detail after final pathology is back  I will also refer him for genetic counseling given personal history of colon cancer and breast cancer in his sister.  I will see him back on 02/21/2019 to discuss further management   Thank you for this kind referral and the opportunity to participate in the care of this patient   Visit Diagnosis 1. Malignant neoplasm of colon, unspecified part of colon ([place])   2. Neoplasm of digestive system   3. Abnormal CT of liver   4. History of cigar smoking     Dr. Randa Evens, MD, MPH [facility] [id number] 02/03/2019  10:44 AM

## 2019-02-04 ENCOUNTER — Telehealth: Payer: Self-pay

## 2019-02-04 NOTE — Telephone Encounter (Signed)
Tried reaching patient-covid negative-unable to leave message.

## 2019-02-06 ENCOUNTER — Inpatient Hospital Stay
Admission: RE | Admit: 2019-02-06 | Discharge: 2019-02-10 | DRG: 331 | Disposition: A | Discharge status: Home / Self Care | Payer: No Typology Code available for payment source | Attending: Surgery | Admitting: Surgery

## 2019-02-06 ENCOUNTER — Inpatient Hospital Stay: Payer: No Typology Code available for payment source | Admitting: Certified Registered"

## 2019-02-06 ENCOUNTER — Other Ambulatory Visit: Payer: Self-pay

## 2019-02-06 ENCOUNTER — Encounter
Admission: RE | Disposition: A | Discharge status: Home / Self Care | Payer: Self-pay | Source: Home / Self Care | Attending: Surgery

## 2019-02-06 DIAGNOSIS — Z5331 Laparoscopic surgical procedure converted to open procedure: Secondary | ICD-10-CM | POA: Diagnosis not present

## 2019-02-06 DIAGNOSIS — Z801 Family history of malignant neoplasm of trachea, bronchus and lung: Secondary | ICD-10-CM | POA: Diagnosis not present

## 2019-02-06 DIAGNOSIS — F17290 Nicotine dependence, other tobacco product, uncomplicated: Secondary | ICD-10-CM | POA: Diagnosis present

## 2019-02-06 DIAGNOSIS — Z85038 Personal history of other malignant neoplasm of large intestine: Secondary | ICD-10-CM

## 2019-02-06 DIAGNOSIS — C187 Malignant neoplasm of sigmoid colon: Secondary | ICD-10-CM | POA: Diagnosis present

## 2019-02-06 DIAGNOSIS — K660 Peritoneal adhesions (postprocedural) (postinfection): Secondary | ICD-10-CM | POA: Diagnosis present

## 2019-02-06 DIAGNOSIS — Z20828 Contact with and (suspected) exposure to other viral communicable diseases: Secondary | ICD-10-CM | POA: Diagnosis present

## 2019-02-06 HISTORY — PX: COLOSTOMY: SHX63

## 2019-02-06 HISTORY — PX: LAPAROSCOPIC SIGMOID COLECTOMY: SHX5928

## 2019-02-06 LAB — PREPARE FRESH FROZEN PLASMA
Unit division: 0
Unit division: 0

## 2019-02-06 LAB — BPAM FFP
Blood Product Expiration Date: 202011242359
Blood Product Expiration Date: 202011242359
Unit Type and Rh: 600
Unit Type and Rh: 6200

## 2019-02-06 LAB — CBC
HCT: 32.7 % — ABNORMAL LOW (ref 39.0–52.0)
Hemoglobin: 10.6 g/dL — ABNORMAL LOW (ref 13.0–17.0)
MCH: 30.2 pg (ref 26.0–34.0)
MCHC: 32.4 g/dL (ref 30.0–36.0)
MCV: 93.2 fL (ref 80.0–100.0)
Platelets: 157 10*3/uL (ref 150–400)
RBC: 3.51 MIL/uL — ABNORMAL LOW (ref 4.22–5.81)
RDW: 12.6 % (ref 11.5–15.5)
WBC: 11.9 10*3/uL — ABNORMAL HIGH (ref 4.0–10.5)
nRBC: 0 % (ref 0.0–0.2)

## 2019-02-06 LAB — CREATININE, SERUM
Creatinine, Ser: 1.24 mg/dL (ref 0.61–1.24)
GFR calc Af Amer: >60 mL/min (ref >60)
GFR calc non Af Amer: >60 mL/min (ref >60)

## 2019-02-06 LAB — ABO/RH: ABO/RH(D): A POS

## 2019-02-06 SURGERY — COLECTOMY, SIGMOID, LAPAROSCOPIC
Anesthesia: General | Site: Abdomen | Laterality: Right

## 2019-02-06 MED ORDER — MORPHINE SULFATE (PF) 4 MG/ML IV SOLN: 4.0000 mg | INTRAVENOUS | Status: DC | PRN

## 2019-02-06 MED ORDER — EPINEPHRINE PF 1 MG/ML IJ SOLN
INTRAMUSCULAR | Status: AC
  Filled 2019-02-06: qty 1

## 2019-02-06 MED ORDER — FENTANYL CITRATE (PF) 100 MCG/2ML IJ SOLN
INTRAMUSCULAR | Status: DC | PRN
  Administered 2019-02-06 (×3): 50 ug via INTRAVENOUS

## 2019-02-06 MED ORDER — ACETAMINOPHEN 500 MG PO TABS: 1000.0000 mg | ORAL_TABLET | Freq: Four times a day (QID) | ORAL | Status: DC

## 2019-02-06 MED ORDER — LACTATED RINGERS IV SOLN: INTRAVENOUS | Status: DC | PRN

## 2019-02-06 MED ORDER — BUPIVACAINE LIPOSOME 1.3 % IJ SUSP
INTRAMUSCULAR | Status: DC | PRN
  Administered 2019-02-06: 20 mL

## 2019-02-06 MED ORDER — LIDOCAINE HCL (CARDIAC) PF 100 MG/5ML IV SOSY
PREFILLED_SYRINGE | INTRAVENOUS | Status: DC | PRN
  Administered 2019-02-06: 100 mg via INTRAVENOUS

## 2019-02-06 MED ORDER — SODIUM CHLORIDE 0.9 % IV SOLN
2.0000 g | INTRAVENOUS | Status: AC
  Administered 2019-02-06 (×2): 2 g via INTRAVENOUS
  Filled 2019-02-06: qty 2

## 2019-02-06 MED ORDER — BUPIVACAINE-EPINEPHRINE (PF) 0.25% -1:200000 IJ SOLN
INTRAMUSCULAR | Status: DC | PRN
  Administered 2019-02-06: 30 mL via PERINEURAL

## 2019-02-06 MED ORDER — PROMETHAZINE HCL 25 MG/ML IJ SOLN: 6.2500 mg | INTRAMUSCULAR | Status: DC | PRN

## 2019-02-06 MED ORDER — SUGAMMADEX SODIUM 500 MG/5ML IV SOLN
INTRAVENOUS | Status: DC | PRN
  Administered 2019-02-06: 200 mg via INTRAVENOUS

## 2019-02-06 MED ORDER — ROCURONIUM BROMIDE 100 MG/10ML IV SOLN
INTRAVENOUS | Status: DC | PRN
  Administered 2019-02-06 (×2): 10 mg via INTRAVENOUS
  Administered 2019-02-06 (×4): 20 mg via INTRAVENOUS
  Administered 2019-02-06: 5 mg via INTRAVENOUS
  Administered 2019-02-06: 45 mg via INTRAVENOUS

## 2019-02-06 MED ORDER — SODIUM CHLORIDE 0.9 % IV SOLN
INTRAVENOUS | Status: DC | PRN
  Administered 2019-02-06: 15:00:00 via INTRAVENOUS

## 2019-02-06 MED ORDER — HYDRALAZINE HCL 20 MG/ML IJ SOLN: 10.0000 mg | INTRAMUSCULAR | Status: DC | PRN

## 2019-02-06 MED ORDER — SUCCINYLCHOLINE CHLORIDE 20 MG/ML IJ SOLN
INTRAMUSCULAR | Status: DC | PRN
  Administered 2019-02-06: 160 mg via INTRAVENOUS

## 2019-02-06 MED ORDER — HEPARIN SODIUM (PORCINE) 5000 UNIT/ML IJ SOLN
5000.0000 [IU] | Freq: Three times a day (TID) | INTRAMUSCULAR | Status: DC
  Administered 2019-02-07 – 2019-02-10 (×10): 5000 [IU] via SUBCUTANEOUS
  Filled 2019-02-06 (×10): qty 1

## 2019-02-06 MED ORDER — ALBUMIN HUMAN 5 % IV SOLN
INTRAVENOUS | Status: AC
  Filled 2019-02-06: qty 750

## 2019-02-06 MED ORDER — EVICEL 5 ML EX KIT
PACK | CUTANEOUS | Status: AC
  Filled 2019-02-06: qty 1

## 2019-02-06 MED ORDER — SODIUM CHLORIDE 0.9 % IV SOLN
INTRAVENOUS | Status: DC
  Administered 2019-02-06 – 2019-02-07 (×3): via INTRAVENOUS

## 2019-02-06 MED ORDER — FENTANYL CITRATE (PF) 100 MCG/2ML IJ SOLN
INTRAMUSCULAR | Status: AC
  Filled 2019-02-06: qty 2

## 2019-02-06 MED ORDER — KETOROLAC TROMETHAMINE 30 MG/ML IJ SOLN
30.0000 mg | Freq: Four times a day (QID) | INTRAMUSCULAR | Status: DC
  Administered 2019-02-07 – 2019-02-10 (×15): 30 mg via INTRAVENOUS
  Filled 2019-02-06 (×15): qty 1

## 2019-02-06 MED ORDER — LACTATED RINGERS IV SOLN
INTRAVENOUS | Status: DC
  Administered 2019-02-06 (×2): via INTRAVENOUS

## 2019-02-06 MED ORDER — ALBUMIN HUMAN 5 % IV SOLN
INTRAVENOUS | Status: DC | PRN
  Administered 2019-02-06 (×5): via INTRAVENOUS

## 2019-02-06 MED ORDER — BUPIVACAINE HCL (PF) 0.25 % IJ SOLN
INTRAMUSCULAR | Status: AC
  Filled 2019-02-06: qty 30

## 2019-02-06 MED ORDER — ACETAMINOPHEN 325 MG PO TABS: 325.0000 mg | ORAL_TABLET | ORAL | Status: DC | PRN

## 2019-02-06 MED ORDER — METOPROLOL TARTRATE 5 MG/5ML IV SOLN
INTRAVENOUS | Status: DC | PRN
  Administered 2019-02-06: 2 mg via INTRAVENOUS

## 2019-02-06 MED ORDER — FAMOTIDINE 20 MG PO TABS
20.0000 mg | ORAL_TABLET | Freq: Once | ORAL | Status: AC
  Administered 2019-02-06: 20 mg via ORAL

## 2019-02-06 MED ORDER — KETAMINE HCL 50 MG/ML IJ SOLN
INTRAMUSCULAR | Status: AC
  Filled 2019-02-06: qty 10

## 2019-02-06 MED ORDER — HYDROCODONE-ACETAMINOPHEN 7.5-325 MG PO TABS: 1.0000 | ORAL_TABLET | Freq: Once | ORAL | Status: DC | PRN

## 2019-02-06 MED ORDER — GLYCOPYRROLATE 0.2 MG/ML IJ SOLN
INTRAMUSCULAR | Status: DC | PRN
  Administered 2019-02-06: 0.2 mg via INTRAVENOUS

## 2019-02-06 MED ORDER — ACETAMINOPHEN 160 MG/5ML PO SOLN
325.0000 mg | ORAL | Status: DC | PRN
  Filled 2019-02-06: qty 20.3

## 2019-02-06 MED ORDER — PROPOFOL 10 MG/ML IV BOLUS
INTRAVENOUS | Status: DC | PRN
  Administered 2019-02-06: 160 mg via INTRAVENOUS

## 2019-02-06 MED ORDER — KETOROLAC TROMETHAMINE 30 MG/ML IJ SOLN: 30.0000 mg | Freq: Once | INTRAMUSCULAR | Status: DC | PRN

## 2019-02-06 MED ORDER — SODIUM CHLORIDE (PF) 0.9 % IJ SOLN
INTRAMUSCULAR | Status: DC | PRN
  Administered 2019-02-06: 50 mL via INTRAVENOUS

## 2019-02-06 MED ORDER — HYDROMORPHONE HCL 1 MG/ML IJ SOLN
INTRAMUSCULAR | Status: DC | PRN
  Administered 2019-02-06 (×4): 0.5 mg via INTRAVENOUS

## 2019-02-06 MED ORDER — HEPARIN SODIUM (PORCINE) 5000 UNIT/ML IJ SOLN
5000.0000 [IU] | Freq: Once | INTRAMUSCULAR | Status: AC
  Administered 2019-02-06: 5000 [IU] via SUBCUTANEOUS

## 2019-02-06 MED ORDER — DEXAMETHASONE SODIUM PHOSPHATE 10 MG/ML IJ SOLN
INTRAMUSCULAR | Status: DC | PRN
  Administered 2019-02-06: 10 mg via INTRAVENOUS

## 2019-02-06 MED ORDER — MIDAZOLAM HCL 2 MG/2ML IJ SOLN
INTRAMUSCULAR | Status: AC
  Filled 2019-02-06: qty 2

## 2019-02-06 MED ORDER — DIPHENHYDRAMINE HCL 12.5 MG/5ML PO ELIX
12.5000 mg | ORAL_SOLUTION | Freq: Four times a day (QID) | ORAL | Status: DC | PRN
  Filled 2019-02-06: qty 5

## 2019-02-06 MED ORDER — KETAMINE HCL 50 MG/ML IJ SOLN
INTRAMUSCULAR | Status: DC | PRN
  Administered 2019-02-06: 20 mg via INTRAMUSCULAR

## 2019-02-06 MED ORDER — EVICEL 5 ML EX KIT
PACK | CUTANEOUS | Status: DC | PRN
  Administered 2019-02-06: 5 mL

## 2019-02-06 MED ORDER — ONDANSETRON 4 MG PO TBDP: 4.0000 mg | ORAL_TABLET | Freq: Four times a day (QID) | ORAL | Status: DC | PRN

## 2019-02-06 MED ORDER — SODIUM CHLORIDE 0.9 % IV SOLN
2.0000 g | Freq: Two times a day (BID) | INTRAVENOUS | Status: DC
  Administered 2019-02-06 – 2019-02-08 (×4): 2 g via INTRAVENOUS
  Filled 2019-02-06 (×8): qty 2

## 2019-02-06 MED ORDER — ONDANSETRON HCL 4 MG/2ML IJ SOLN
INTRAMUSCULAR | Status: DC | PRN
  Administered 2019-02-06 (×2): 4 mg via INTRAVENOUS

## 2019-02-06 MED ORDER — CHLORHEXIDINE GLUCONATE CLOTH 2 % EX PADS
6.0000 | MEDICATED_PAD | Freq: Every day | CUTANEOUS | Status: DC
  Administered 2019-02-10: 6 via TOPICAL

## 2019-02-06 MED ORDER — GABAPENTIN 300 MG PO CAPS
300.0000 mg | ORAL_CAPSULE | ORAL | Status: AC
  Administered 2019-02-06: 300 mg via ORAL

## 2019-02-06 MED ORDER — KETOROLAC TROMETHAMINE 30 MG/ML IJ SOLN: INTRAMUSCULAR | Status: DC | PRN

## 2019-02-06 MED ORDER — MEPERIDINE HCL 50 MG/ML IJ SOLN: 6.2500 mg | INTRAMUSCULAR | Status: DC | PRN

## 2019-02-06 MED ORDER — ONDANSETRON HCL 4 MG/2ML IJ SOLN: 4.0000 mg | Freq: Four times a day (QID) | INTRAMUSCULAR | Status: DC | PRN

## 2019-02-06 MED ORDER — HYDROMORPHONE HCL 1 MG/ML IJ SOLN: 0.2500 mg | INTRAMUSCULAR | Status: DC | PRN

## 2019-02-06 MED ORDER — PROCHLORPERAZINE MALEATE 10 MG PO TABS
10.0000 mg | ORAL_TABLET | Freq: Four times a day (QID) | ORAL | Status: DC | PRN
  Filled 2019-02-06: qty 1

## 2019-02-06 MED ORDER — CELECOXIB 200 MG PO CAPS
200.0000 mg | ORAL_CAPSULE | ORAL | Status: AC
  Administered 2019-02-06: 200 mg via ORAL

## 2019-02-06 MED ORDER — SODIUM CHLORIDE 0.9 % IV SOLN
INTRAVENOUS | Status: DC | PRN
  Administered 2019-02-06: 40 ug/min via INTRAVENOUS

## 2019-02-06 MED ORDER — EPHEDRINE SULFATE 50 MG/ML IJ SOLN
INTRAMUSCULAR | Status: DC | PRN
  Administered 2019-02-06 (×2): 5 mg via INTRAVENOUS
  Administered 2019-02-06: 10 mg via INTRAVENOUS
  Administered 2019-02-06 (×3): 5 mg via INTRAVENOUS
  Administered 2019-02-06: 10 mg via INTRAVENOUS
  Administered 2019-02-06: 5 mg via INTRAVENOUS

## 2019-02-06 MED ORDER — MIDAZOLAM HCL 2 MG/2ML IJ SOLN
INTRAMUSCULAR | Status: DC | PRN
  Administered 2019-02-06: 2 mg via INTRAVENOUS

## 2019-02-06 MED ORDER — PROCHLORPERAZINE EDISYLATE 10 MG/2ML IJ SOLN
5.0000 mg | Freq: Four times a day (QID) | INTRAMUSCULAR | Status: DC | PRN
  Filled 2019-02-06: qty 2

## 2019-02-06 MED ORDER — DEXMEDETOMIDINE HCL 200 MCG/2ML IV SOLN
INTRAVENOUS | Status: DC | PRN
  Administered 2019-02-06 (×2): 8 ug via INTRAVENOUS
  Administered 2019-02-06: 4 ug via INTRAVENOUS

## 2019-02-06 MED ORDER — PANTOPRAZOLE SODIUM 40 MG IV SOLR
40.0000 mg | Freq: Every day | INTRAVENOUS | Status: DC
  Administered 2019-02-06 – 2019-02-08 (×3): 40 mg via INTRAVENOUS
  Filled 2019-02-06 (×3): qty 40

## 2019-02-06 MED ORDER — ACETAMINOPHEN 500 MG PO TABS
1000.0000 mg | ORAL_TABLET | ORAL | Status: AC
  Administered 2019-02-06: 1000 mg via ORAL

## 2019-02-06 MED ORDER — SODIUM CHLORIDE 0.9 % IV SOLN
2.0000 g | INTRAVENOUS | Status: AC
  Filled 2019-02-06: qty 2

## 2019-02-06 MED ORDER — PHENYLEPHRINE HCL (PRESSORS) 10 MG/ML IV SOLN
INTRAVENOUS | Status: DC | PRN
  Administered 2019-02-06: 100 ug via INTRAVENOUS
  Administered 2019-02-06 (×2): 200 ug via INTRAVENOUS
  Administered 2019-02-06 (×4): 100 ug via INTRAVENOUS
  Administered 2019-02-06: 200 ug via INTRAVENOUS
  Administered 2019-02-06: 100 ug via INTRAVENOUS
  Administered 2019-02-06: 200 ug via INTRAVENOUS

## 2019-02-06 MED ORDER — HYDROMORPHONE HCL 1 MG/ML IJ SOLN
INTRAMUSCULAR | Status: AC
  Filled 2019-02-06: qty 1

## 2019-02-06 MED ORDER — CHLORHEXIDINE GLUCONATE CLOTH 2 % EX PADS: 6.0000 | MEDICATED_PAD | Freq: Once | CUTANEOUS | Status: DC

## 2019-02-06 MED ORDER — DIPHENHYDRAMINE HCL 50 MG/ML IJ SOLN: 12.5000 mg | Freq: Four times a day (QID) | INTRAMUSCULAR | Status: DC | PRN

## 2019-02-06 MED ORDER — ACETAMINOPHEN 10 MG/ML IV SOLN: 1000.0000 mg | Freq: Once | INTRAVENOUS | Status: DC | PRN

## 2019-02-06 MED ORDER — LACTATED RINGERS IV SOLN
INTRAVENOUS | Status: DC | PRN
  Administered 2019-02-06: 12:00:00 via INTRAVENOUS

## 2019-02-06 MED ORDER — OXYCODONE HCL 5 MG PO TABS: 5.0000 mg | ORAL_TABLET | ORAL | Status: DC | PRN

## 2019-02-06 MED ORDER — ONDANSETRON HCL 4 MG/2ML IJ SOLN: 4.0000 mg | Freq: Once | INTRAMUSCULAR | Status: DC | PRN

## 2019-02-06 MED ORDER — SODIUM CHLORIDE (PF) 0.9 % IJ SOLN
INTRAMUSCULAR | Status: AC
  Filled 2019-02-06: qty 50

## 2019-02-06 MED ORDER — BUPIVACAINE LIPOSOME 1.3 % IJ SUSP
INTRAMUSCULAR | Status: AC
  Filled 2019-02-06: qty 20

## 2019-02-06 SURGICAL SUPPLY — 97 items
APPLIER CLIP 13 LRG OPEN (CLIP)
APPLIER CLIP 5 13 M/L LIGAMAX5 (MISCELLANEOUS) ×4
APPLIER CLIP ROT 10 11.4 M/L (STAPLE) ×4
BAG DECANTER FOR FLEXI CONT (MISCELLANEOUS) ×4 IMPLANT
BLADE CLIPPER SURG (BLADE) ×8 IMPLANT
BLADE SURG SZ10 CARB STEEL (BLADE) ×4 IMPLANT
BLADE SURG SZ11 CARB STEEL (BLADE) IMPLANT
BULB RESERV EVAC DRAIN JP 100C (MISCELLANEOUS) ×4 IMPLANT
CANISTER SUCT 1200ML W/VALVE (MISCELLANEOUS) ×4 IMPLANT
CATH ROBINSON RED 14FR (CATHETERS) ×2 IMPLANT
CATH ROBINSON RED A/P 16FR (CATHETERS) ×4 IMPLANT
CATH URET ROBINSON RED 14FR (CATHETERS) ×2
CHLORAPREP W/TINT 26 (MISCELLANEOUS) ×4 IMPLANT
CLIP APPLIE 13 LRG OPEN (CLIP) IMPLANT
CLIP APPLIE 5 13 M/L LIGAMAX5 (MISCELLANEOUS) ×2 IMPLANT
CLIP APPLIE ROT 10 11.4 M/L (STAPLE) ×2 IMPLANT
COVER WAND RF STERILE (DRAPES) ×4 IMPLANT
DEFOGGER SCOPE WARMER CLEARIFY (MISCELLANEOUS) ×4 IMPLANT
DERMABOND ADVANCED (GAUZE/BANDAGES/DRESSINGS) ×4
DERMABOND ADVANCED .7 DNX12 (GAUZE/BANDAGES/DRESSINGS) ×4 IMPLANT
DRAIN CHANNEL JP 19F (MISCELLANEOUS) ×4 IMPLANT
DRAPE INCISE IOBAN 66X45 STRL (DRAPES) ×4 IMPLANT
DRAPE LEGGINS SURG 28X43 STRL (DRAPES) ×4 IMPLANT
DRAPE UNDER BUTTOCK W/FLU (DRAPES) ×4 IMPLANT
DRSG OPSITE POSTOP 3X4 (GAUZE/BANDAGES/DRESSINGS) ×8 IMPLANT
DRSG OPSITE POSTOP 4X10 (GAUZE/BANDAGES/DRESSINGS) ×4 IMPLANT
DRSG TEGADERM 4X4.75 (GAUZE/BANDAGES/DRESSINGS) ×4 IMPLANT
ELECT BLADE 6.5 EXT (BLADE) ×4 IMPLANT
ELECT CAUTERY BLADE 6.4 (BLADE) ×4 IMPLANT
ELECT REM PT RETURN 9FT ADLT (ELECTROSURGICAL) ×4
ELECTRODE REM PT RTRN 9FT ADLT (ELECTROSURGICAL) ×2 IMPLANT
GAUZE SPONGE 4X4 12PLY STRL (GAUZE/BANDAGES/DRESSINGS) ×4 IMPLANT
GLOVE BIO SURGEON STRL SZ7 (GLOVE) ×12 IMPLANT
GOWN STRL REUS W/ TWL LRG LVL3 (GOWN DISPOSABLE) ×12 IMPLANT
GOWN STRL REUS W/TWL LRG LVL3 (GOWN DISPOSABLE) ×12
HANDLE SUCTION POOLE (INSTRUMENTS) IMPLANT
HANDLE YANKAUER SUCT BULB TIP (MISCELLANEOUS) ×4 IMPLANT
IRRIGATION STRYKERFLOW (MISCELLANEOUS) ×2 IMPLANT
IRRIGATOR STRYKERFLOW (MISCELLANEOUS) ×4
IV NS 1000ML (IV SOLUTION) ×2
IV NS 1000ML BAXH (IV SOLUTION) ×2 IMPLANT
JACKSON PRATT 10 (INSTRUMENTS) IMPLANT
KIT OSTOMY 2 PC DRNBL 2.25 STR (WOUND CARE) ×2 IMPLANT
KIT OSTOMY DRAINABLE 2.25 STR (WOUND CARE) ×2
L-HOOK LAP DISP 36CM (ELECTROSURGICAL) ×4
LHOOK LAP DISP 36CM (ELECTROSURGICAL) ×2 IMPLANT
LIGASURE IMPACT 36 18CM CVD LR (INSTRUMENTS) ×4 IMPLANT
MARKER SKIN DUAL TIP RULER LAB (MISCELLANEOUS) IMPLANT
NEEDLE HYPO 22GX1.5 SAFETY (NEEDLE) ×4 IMPLANT
NS IRRIG 1000ML POUR BTL (IV SOLUTION) ×20 IMPLANT
PACK COLON CLEAN CLOSURE (MISCELLANEOUS) ×4 IMPLANT
PACK LAP CHOLECYSTECTOMY (MISCELLANEOUS) ×4 IMPLANT
PENCIL ELECTRO HAND CTR (MISCELLANEOUS) ×8 IMPLANT
RELOAD STAPLER BLUE 60MM (STAPLE) ×2 IMPLANT
RELOAD STAPLER WHITE 60MM (STAPLE) ×4 IMPLANT
SCISSORS METZENBAUM CVD 33 (INSTRUMENTS) IMPLANT
SET TUBE SMOKE EVAC HIGH FLOW (TUBING) ×4 IMPLANT
SHEARS HARMONIC ACE PLUS 36CM (ENDOMECHANICALS) ×4 IMPLANT
SLEEVE ENDOPATH XCEL 5M (ENDOMECHANICALS) ×8 IMPLANT
SOL PREP PVP 2OZ (MISCELLANEOUS) ×4
SOLUTION PREP PVP 2OZ (MISCELLANEOUS) ×2 IMPLANT
SPONGE DRAIN TRACH 4X4 STRL 2S (GAUZE/BANDAGES/DRESSINGS) ×4 IMPLANT
SPONGE KITTNER 5P (MISCELLANEOUS) ×4 IMPLANT
SPONGE LAP 18X18 RF (DISPOSABLE) ×32 IMPLANT
STAPLE ECHEON FLEX 60 POW ENDO (STAPLE) ×4 IMPLANT
STAPLER CIRCULAR 29MM (STAPLE) IMPLANT
STAPLER CUT CVD 40MM BLUE (STAPLE) ×4 IMPLANT
STAPLER ENDO ILS CVD 18 33 (STAPLE) ×4 IMPLANT
STAPLER PROX 25M (MISCELLANEOUS) IMPLANT
STAPLER RELOAD BLUE 60MM (STAPLE) ×4
STAPLER RELOAD WHITE 60MM (STAPLE) ×8
STAPLER SKIN PROX 35W (STAPLE) ×12 IMPLANT
SUCT SIGMOIDOSCOPE TIP 18 W/TU (SUCTIONS) IMPLANT
SUCTION POOLE HANDLE (INSTRUMENTS)
SUT ETHILON 3-0 FS-10 30 BLK (SUTURE) ×4
SUT MNCRL AB 4-0 PS2 18 (SUTURE) ×8 IMPLANT
SUT PDS AB 0 CT1 27 (SUTURE) ×12 IMPLANT
SUT PROLENE 5 0 RB 1 DA (SUTURE) ×28 IMPLANT
SUT SILK 2 0 (SUTURE) ×2
SUT SILK 2 0SH CR/8 30 (SUTURE) ×4 IMPLANT
SUT SILK 2-0 (SUTURE) ×4 IMPLANT
SUT SILK 2-0 18XBRD TIE 12 (SUTURE) ×2 IMPLANT
SUT SILK 3-0 (SUTURE) ×4 IMPLANT
SUT VIC AB 2-0 SH 27 (SUTURE) ×4
SUT VIC AB 2-0 SH 27XBRD (SUTURE) ×4 IMPLANT
SUT VIC AB 3-0 SH 27 (SUTURE) ×14
SUT VIC AB 3-0 SH 27X BRD (SUTURE) ×14 IMPLANT
SUTURE EHLN 3-0 FS-10 30 BLK (SUTURE) ×2 IMPLANT
SYR 20ML LL LF (SYRINGE) ×8 IMPLANT
SYR 50ML LL SCALE MARK (SYRINGE) ×4 IMPLANT
SYRINGE IRR TOOMEY STRL 70CC (SYRINGE) ×4 IMPLANT
SYS LAPSCP GELPORT 120MM (MISCELLANEOUS) ×4
SYSTEM LAPSCP GELPORT 120MM (MISCELLANEOUS) ×2 IMPLANT
TOWEL OR 17X26 4PK STRL BLUE (TOWEL DISPOSABLE) ×8 IMPLANT
TRAY FOLEY MTR SLVR 16FR STAT (SET/KITS/TRAYS/PACK) ×4 IMPLANT
TROCAR XCEL 12X100 BLDLESS (ENDOMECHANICALS) ×4 IMPLANT
TROCAR XCEL NON-BLD 5MMX100MML (ENDOMECHANICALS) ×4 IMPLANT

## 2019-02-06 NOTE — Anesthesia Post-op Follow-up Note (Signed)
Anesthesia QCDR form completed.        

## 2019-02-06 NOTE — Anesthesia Preprocedure Evaluation (Addendum)
Anesthesia Evaluation  Patient identified by MRN, date of birth, ID band Patient awake    Reviewed: Allergy & Precautions, H&P , NPO status , reviewed documented beta blocker date and time   Airway Mallampati: II  TM Distance: >3 FB Neck ROM: full    Dental  (+) Chipped   Pulmonary shortness of breath, Current Smoker and Patient abstained from smoking.,    Pulmonary exam normal        Cardiovascular Normal cardiovascular exam     Neuro/Psych    GI/Hepatic neg GERD  ,  Endo/Other    Renal/GU      Musculoskeletal   Abdominal   Peds  Hematology   Anesthesia Other Findings Past Medical History: No date: Abdominal pain No date: Allergy No date: Cancer [facility]) No date: Cellulitis No date: Dyspnea No date: Liver spot No date: Loose stools No date: Weight loss Past Surgical History: 01/02/2019: COLONOSCOPY WITH PROPOFOL; N/A     Comment:  Procedure: COLONOSCOPY WITH BIOPSY;  Surgeon: [name], MD;  Location: [facility];  Service:               Endoscopy;  Laterality: N/A;  Clip x2 at Biopsy Site No date: excision of skin lesion 1969: TONSILLECTOMY AND ADENOIDECTOMY   Reproductive/Obstetrics                            Anesthesia Physical Anesthesia Plan  ASA: II  Anesthesia Plan: General   Post-op Pain Management:    Induction: Intravenous  PONV Risk Score and Plan: 2 and Ondansetron, Treatment may vary due to age or medical condition and Midazolam  Airway Management Planned: Oral ETT  Additional Equipment:   Intra-op Plan:   Post-operative Plan: Extubation in OR  Informed Consent: I have reviewed the patients History and Physical, chart, labs and discussed the procedure including the risks, benefits and alternatives for the proposed anesthesia with the patient or authorized representative who has indicated his/her understanding and acceptance.      Dental Advisory Given  Plan Discussed with: CRNA  Anesthesia Plan Comments:         Anesthesia Quick Evaluation

## 2019-02-06 NOTE — OR Nursing (Signed)
Allergy alert for celebrex, contacted pharmacy and Dr Dahlia Byes.  Medication given per order.

## 2019-02-06 NOTE — Anesthesia Procedure Notes (Signed)
Procedure Name: Intubation Performed by: Fletcher-Harrison, Cornie Mccomber, CRNA Pre-anesthesia Checklist: Patient identified, Emergency Drugs available, Suction available and Patient being monitored Patient Re-evaluated:Patient Re-evaluated prior to induction Oxygen Delivery Method: Circle system utilized Preoxygenation: Pre-oxygenation with 100% oxygen Induction Type: IV induction Ventilation: Mask ventilation without difficulty Laryngoscope Size: McGraph and 4 Grade View: Grade I Tube type: Oral Tube size: 7.0 mm Number of attempts: 1 Airway Equipment and Method: Stylet Placement Confirmation: ETT inserted through vocal cords under direct vision,  positive ETCO2,  CO2 detector and breath sounds checked- equal and bilateral Secured at: 21 cm Tube secured with: Tape Dental Injury: Teeth and Oropharynx as per pre-operative assessment        

## 2019-02-06 NOTE — Interval H&P Note (Signed)
History and Physical Interval Note:  02/06/2019 11:34 AM  Keith Trujillo  has presented today for surgery, with the diagnosis of colon ca.  The various methods of treatment have been discussed with the patient and family. After consideration of risks, benefits and other options for treatment, the patient has consented to  Procedure(s): LAPAROSCOPIC SIGMOID COLECTOMY (N/A) as a surgical intervention.  The patient's history has been reviewed, patient examined, no change in status, stable for surgery.  I have reviewed the patient's chart and labs.  Questions were answered to the patient's satisfaction.     [place]

## 2019-02-06 NOTE — Transfer of Care (Signed)
Immediate Anesthesia Transfer of Care Note  Patient: Keith Trujillo  Procedure(s) Performed: LAPAROSCOPIC SIGMOID COLECTOMY converted to open procedure (N/A ) COLOSTOMY (Right Abdomen)  Patient Location: PACU  Anesthesia Type:General  Level of Consciousness: sedated  Airway & Oxygen Therapy: Patient Spontanous Breathing and Patient connected to face mask oxygen  Post-op Assessment: Report given to RN and Post -op Vital signs reviewed and stable  Post vital signs: Reviewed  Last Vitals:  Vitals Value Taken Time  BP 97/44 02/06/19 1847  Temp 37 C 02/06/19 1847  Pulse 74 02/06/19 1847  Resp 29 02/06/19 1847  SpO2 99 % 02/06/19 1847  Vitals shown include unvalidated device data.  Last Pain:  Vitals:   02/06/19 1051  TempSrc: Temporal  PainSc: 0-No pain         Complications: No apparent anesthesia complications

## 2019-02-06 NOTE — Op Note (Signed)
PROCEDURES: 1. Laparoscopic takedown of splenic flexure 2. Attempted laparoscopic colon resection 3.  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 Genevive Bi required due to the complexity of the case and for the anastomosis  Anesthesia: General endotracheal anesthesia  ASA Class: 2   Surgeon: [name] , MD FACS  Anesthesia: Gen. with endotracheal tube  Findings: Rectal exam only able to palpable mass after bimanual abdominal and rectal palpation, Mass around 11cms from anal verge. Good 4 cms distal margin of resection Large near obstructing sigmoid colon mass extending into the rectum. Evidence of suspicious metastatic disease within the mesorectum and mesenteric lymph nodes along the IMA. No evidence of liver mets  On palpation/inspection We were able to accomplish a macroscopic R-0 resection Due to the ultralow anastomosis likely need for further intervention such as radiation therapy I decided to perform a loop ileostomy to protect the anastomosis. Evidence of bleeding from sacral plexus with significant blood loss  Estimated Blood Loss: 700cc         Drains: 19 FR pelvis         Specimens: Colon and rectum             Condition: stable  Procedure Details  The patient was seen again in the Holding Room. The benefits, complications, treatment options, and expected outcomes were discussed with the patient. The risks of bleeding, infection, recurrence of symptoms, failure to resolve symptoms,  bowel injury, any of which could require further surgery were reviewed with the patient.   The patient was taken to Operating Room, identified as Nygil Luttrull and the procedure verified.  A Time Out was held and the above information confirmed.  Prior to the induction of general anesthesia, antibiotic prophylaxis was  administered. VTE prophylaxis was in place. General endotracheal anesthesia was then administered and tolerated well. After the induction, the abdomen was prepped with Chloraprep and draped in the sterile fashion. The patient was positioned in lithotomy position. Rectal exam revealed palpable mass at the tip of my middle finger after being able to push down from the abdominal cavity in a bimanual fashion.  No evidence of lower rectal involvement.  7 cm incision was created as a midline mini laparotomy. The abdominal cavity was entered under direct visualization and the GelPort device was placed. A 5 mm port was placed in the suprapubic area under direct visualization and pneumoperitoneum was obtained. There were dense adhesions from the omentum to the abdominal wall that where lysed in the standard fashion with the Harmonic scalpel. We also were able to place a 12 mm port in the right lower quadrant and a 5 mm port in the left lower quadrant under direct visualization. There was significant adhesive disease in the pelvis from the sigmoid to the pelvic wall  These adhesions were lysed with a combination of finger fracturing and Harmonic scalpel. The white line of pot was identified and divided and we mobilized the descending colon IN a lateral to medial fashion. We preserved the ureter at all times. We were also able to mobilize the splenic flexure using Harmonic scalpel in the standard fashion.  Attention then was turned to the pelvis where I started a total Me as a rectal excision by incising of both thighs of the rectum.  Please note that this was a large sigmoid mass with near obstructing features.  There  was no evidence of remote metastatic disease.  There was evidence of bulky lymphadenopathy within the mesentery and the upper mesorectum.  I was able to identify of the presacral space and adventitial tissue.  While doing this dissection I encounter venous bleeding.  I was unable to control this with the  laparoscope.  I placed my hand and held pressure for 5 minutes.  I alerted my team for conversion to an open procedure.  Were able to extend our incision to a generous midline laparotomy.  While my attending pressure were able to assess the situation.  They bleeding was significant and was coming from the sacral plexus.  Were able to apply pressure on control the bleeding temporarily.  We were able to finally control bleeding with multiple figure-of-eight pledgeted 5-0 Prolene sutures in a figure-of-eight fashion.  Visualize the iliac artery and vein and there was no evidence of injury to those structures.  We also were able to identified both ureters and preserve them at all times. Once we have adequate hemostatic control we are able to reassess the situation again.  Attention then was turned to continue our dissection within the mesorectal plane both towards the Kocsis and on both lateral rectal stalks.  We also were able to score the peritoneal reflection anteriorly and dissected the rectum from the bladder in the standard fashion.  We did gain significant mobility after mobilizing the peritoneal reflection and we were able to obtain good distal margins. Using a contour stapler we divided the distal rectum in the standard fashion. This left at the with an low stump.  We identified the takeoff of the inferior mesenteric artery dissected the pedicle and divided using a 60 mm vascular echelon stapler in the standard fashion. Using the Ligasure we were able to divide the mesorectum and and also divided proximal to the mesentery of the descending colon. Once we have an adequate visualization and mobilization we divided the descending colon proximally with multiple blue loads using the echelon stapler.  We opened the descending colon and measure the diameter of the bowel. A 96mm dilator was perfect size. A pursestring was used after inserting the anvil device. Dr. Genevive Bi was able to pass a 33 mm standard EEA  stapler device through the anus and Under direct visualization we perform an end to end anastomosis with the EEA device. A leak test was performed inflating the colon with a Toomey syringe and a rubber catheter. No evidence of leak was observed. There was also adequate hemostasis. A 19 Blake drain was placed in the pelvis. We were able to mobilize the omentum and I created an omental flap to attach it to the anastomosis. The drain was sutured in place with a 3-0 nylon. A second look showed no evidence of any bleeding or any other injuries.  Right lower quadrant incision was created and the fascia incised in a cruciate fashion.  I was able to place 2 fingers to the abdominal wall and we were able to exteriorize a loop of distal terminal ileum to create a loop ileostomy.  We changed gloves and place a new tray to close the abdomen with a 0 PDS suture in a running fashion and the skin was closed with staples. Liposomal Marcaine was injected on all incision sites under direct visualization. Sterile dressings were applied to all skin incision and I was able to mature the loop ileostomy after incising the bowel and maturing the ostomy with multiple 3-0 interrupted Vicryl sutures to create a rosebud  ileostomy.  Please also note that I did place a red rubber catheter and attaches to his self with an interrupted stitch.  The ileostomy layout really nicely.  I was able to place an ostomy appliance.  Needle and laparotomy count were correct and there were no immediate complications.  [name], MD, FACS

## 2019-02-06 NOTE — Anesthesia Postprocedure Evaluation (Signed)
Anesthesia Post Note  Patient: Environmental education officer  Procedure(s) Performed: LAPAROSCOPIC SIGMOID COLECTOMY converted to open procedure (N/A ) COLOSTOMY (Right Abdomen)  Patient location during evaluation: PACU Anesthesia Type: General Level of consciousness: awake and alert Pain management: pain level controlled Vital Signs Assessment: post-procedure vital signs reviewed and stable Respiratory status: spontaneous breathing, nonlabored ventilation and respiratory function stable Cardiovascular status: blood pressure returned to baseline and stable Postop Assessment: no apparent nausea or vomiting Anesthetic complications: no     Last Vitals:  Vitals:   02/06/19 2047 02/06/19 2102  BP: 117/74 124/68  Pulse: 92 88  Resp: 16 20  Temp:    SpO2: 99% 99%    Last Pain:  Vitals:   02/06/19 2102  TempSrc:   PainSc: 0-No pain                 Durenda Hurt

## 2019-02-07 ENCOUNTER — Encounter: Payer: Self-pay | Admitting: Surgery

## 2019-02-07 LAB — BPAM RBC
Blood Product Expiration Date: 202012112359
Blood Product Expiration Date: 202012162359
ISSUE DATE / TIME: 202011191448
ISSUE DATE / TIME: 202011191448
Unit Type and Rh: 6200
Unit Type and Rh: 6200

## 2019-02-07 LAB — TYPE AND SCREEN
ABO/RH(D): A POS
Antibody Screen: NEGATIVE
Unit division: 0
Unit division: 0

## 2019-02-07 LAB — CBC
HCT: 29.6 % — ABNORMAL LOW (ref 39.0–52.0)
Hemoglobin: 9.5 g/dL — ABNORMAL LOW (ref 13.0–17.0)
MCH: 30.7 pg (ref 26.0–34.0)
MCHC: 32.1 g/dL (ref 30.0–36.0)
MCV: 95.8 fL (ref 80.0–100.0)
Platelets: 153 10*3/uL (ref 150–400)
RBC: 3.09 MIL/uL — ABNORMAL LOW (ref 4.22–5.81)
RDW: 12.6 % (ref 11.5–15.5)
WBC: 10.3 10*3/uL (ref 4.0–10.5)
nRBC: 0 % (ref 0.0–0.2)

## 2019-02-07 LAB — COMPREHENSIVE METABOLIC PANEL
ALT: 12 U/L (ref 0–44)
AST: 14 U/L — ABNORMAL LOW (ref 15–41)
Albumin: 3.3 g/dL — ABNORMAL LOW (ref 3.5–5.0)
Alkaline Phosphatase: 39 U/L (ref 38–126)
Anion gap: 11 (ref 5–15)
BUN: 14 mg/dL (ref 6–20)
CO2: 21 mmol/L — ABNORMAL LOW (ref 22–32)
Calcium: 8 mg/dL — ABNORMAL LOW (ref 8.9–10.3)
Chloride: 107 mmol/L (ref 98–111)
Creatinine, Ser: 0.95 mg/dL (ref 0.61–1.24)
GFR calc Af Amer: >60 mL/min (ref >60)
GFR calc non Af Amer: >60 mL/min (ref >60)
Glucose, Bld: 175 mg/dL — ABNORMAL HIGH (ref 70–99)
Potassium: 4 mmol/L (ref 3.5–5.1)
Sodium: 139 mmol/L (ref 135–145)
Total Bilirubin: 0.6 mg/dL (ref 0.3–1.2)
Total Protein: 5.2 g/dL — ABNORMAL LOW (ref 6.5–8.1)

## 2019-02-07 LAB — PREPARE RBC (CROSSMATCH)

## 2019-02-07 LAB — HIV ANTIBODY (ROUTINE TESTING W REFLEX): HIV Screen 4th Generation wRfx: NONREACTIVE

## 2019-02-07 MED ORDER — ACETAMINOPHEN 500 MG PO TABS
1000.0000 mg | ORAL_TABLET | Freq: Four times a day (QID) | ORAL | Status: DC
  Administered 2019-02-07 – 2019-02-10 (×15): 1000 mg via ORAL
  Filled 2019-02-07 (×14): qty 2

## 2019-02-07 MED ORDER — ENSURE ENLIVE PO LIQD
237.0000 mL | Freq: Three times a day (TID) | ORAL | Status: DC
  Administered 2019-02-07 – 2019-02-10 (×5): 237 mL via ORAL

## 2019-02-07 NOTE — Progress Notes (Signed)
   02/07/19 0600  Clinical Encounter Type  Visited With Patient  Visit Type Initial;Spiritual support;Post-op  Referral From Nurse  Spiritual Encounters  Spiritual Needs Prayer;Emotional;Grief support  Stress Factors  Patient Stress Factors Loss;Other (Comment)

## 2019-02-07 NOTE — Consult Note (Signed)
[facility] Nurse ostomy consult note Stoma type/location: RLQ, loop ileostomy  Stomal assessment/size: pink, moist, slightly budded (viewed through pouch) with red rubber catheter in place for ostomy support Peristomal assessment: NA Treatment options for stomal/peristomal skin: NA Output scant liquid gree Ostomy pouching: 2pc.  Education provided:  Explained role of ostomy nurse and creation of stoma  Education on emptying when 1/3 to 1/2 full and how to empty Demonstrated "burping" flatus from pouch Demonstrated lock and roll closure; allowed patient to use sample to practice Demonstrated attaching skin barrier to the pouch, allowed to use sample to practice. Patient lives with wife, but he has indicated she will not be interested in learning ostomy care.  Will plan next visit on Monday for demonstration and patient participation in pouch change.   Enrolled patient in [place] program: No, form signed left in [place] office for assessment of needs next week.   [facility] Nurse will follow along with you for continued support with ostomy teaching and care [place] MSN, RN, [place], [place], [place]

## 2019-02-07 NOTE — Progress Notes (Signed)
[facility] Day(s): 1.   Post op day(s): 1 Day Post-Op.   Interval History:  Patient seen and examined no acute events or new complaints overnight.  Patient reports he is feeling well this morning. He notes incisional soreness, worse with coughing No fever, chills, nausea, or emesis Hgb - 9.5 this morning Blake drain - 185 ccs serosanguinous On CLD which he is tolerating well He is having ileostomy function this morning with gas and stool Has not mobilized yet.    Vital signs in last 24 hours: [min-max] current  Temp:  [97.1 F (36.2 C)-98.8 F (37.1 C)] 98.3 F (36.8 C) (11/20 0500) Pulse Rate:  [61-96] 61 (11/20 0500) Resp:  [9-20] 18 (11/20 0500) BP: (86-165)/(40-82) 114/59 (11/20 0500) SpO2:  [98 %-100 %] 98 % (11/20 0500) Weight:  [90.6 kg] 90.6 kg (11/20 0520)     Height: 6\' 4"  (193 cm) Weight: 90.6 kg BMI (Calculated): 24.32   Intake/Output last 2 shifts:  11/19 0701 - 11/20 0700 In: 4761.3 [I.V.:3661.3; IV Piggyback:1100] Out: 1510 [Urine:625; Drains:185; Blood:700]   Physical Exam:  Constitutional: alert, cooperative and no distress  Respiratory: breathing non-labored at rest  Cardiovascular: regular rate and sinus rhythm  Gastrointestinal: soft, incisional soreness, and non-distended. Ileostomy in right mid abdomen with stool and gas in bag, red rubber present. Balke drain in left abdomen with serosanguinous output Integumentary: Laparotomy incision is CDI with staples and honeycomb.    Labs:  CBC Latest Ref Rng & Units 02/07/2019 02/06/2019 12/13/2018  WBC 4.0 - 10.5 K/uL 10.3 11.9(H) 6.6  Hemoglobin 13.0 - 17.0 g/dL 9.5(L) 10.6(L) 13.1  Hematocrit 39.0 - 52.0 % 29.6(L) 32.7(L) 39.4  Platelets 150 - 400 K/uL 153 157 178   CMP Latest Ref Rng & Units 02/07/2019 02/06/2019 12/13/2018  Glucose 70 - 99 mg/dL 175(H) - 115(H)  BUN 6 - 20 mg/dL 14 - 11  Creatinine 0.61 - 1.24 mg/dL 0.95 1.24 0.83  Sodium 135 -  145 mmol/L 139 - 142  Potassium 3.5 - 5.1 mmol/L 4.0 - 5.0  Chloride 98 - 111 mmol/L 107 - 104  CO2 22 - 32 mmol/L 21(L) - 26  Calcium 8.9 - 10.3 mg/dL 8.0(L) - 9.6  Total Protein 6.5 - 8.1 g/dL 5.2(L) - 6.3  Total Bilirubin 0.3 - 1.2 mg/dL 0.6 - 0.4  Alkaline Phos 38 - 126 U/L 39 - 92  AST 15 - 41 U/L 14(L) - 15  ALT 0 - 44 U/L 12 - 16    Imaging studies: No new pertinent imaging studies   Assessment/Plan:  58 y.o. male overall doing well with incisional soreness but with good ileostomy function this morning 1 Day Post-Op s/p open LAR with EEA anastomosis and diverting loop ileostomy for near obstructing sigmoid cancer   - Advance to full liquid diet  - Wean IVF  - Discontinue Foley catheter  - Engage WOC RN for ileostomy management and teaching  - pain control prn; antiemetics prn  - monitor abdominal examination  - encouraged early mobilization   - medical management of comorbidities    All of the above findings and recommendations were discussed with the patient, and the medical team, and all of patient's questions were answered to his expressed satisfaction.  -- Keith Simon, PA-C [facility] 02/07/2019, 7:23 AM [phone] M-F: 7am - 4pm

## 2019-02-07 NOTE — Progress Notes (Signed)
Initial Nutrition Assessment  DOCUMENTATION CODES:   Not applicable  INTERVENTION:   Ensure Enlive po TID, each supplement provides 350 kcal and 20 grams of protein  NUTRITION DIAGNOSIS:   Increased nutrient needs related to post-op healing as evidenced by increased  estimated needs.  GOAL:   Patient will meet greater than or equal to 90% of their needs  MONITOR:   PO intake, Supplement acceptance, Labs, Weight trends, Skin, I & O's  REASON FOR ASSESSMENT:   Malnutrition Screening Tool    ASSESSMENT:   58 y.o. male s/p open LAR with EEA anastomosis and diverting loop ileostomy 11/19 for near obstructing sigmoid cancer  Pt reports ongoing GI issues including constipation, early satiety and abdominal pain for the past 6 months. Pt found to have colon mass now s/p resection and diverting loop ileostomy. Pt reports feeling ok today. Pt denies any abdominal pain other than incisional soreness. Pt is having stool and flatus from his ostomy. Pt denies any nausea or vomiting. Pt advanced to a full liquid diet today. Pt ate 100% of his breakfast tray. RD will add supplements to help pt meet his estimated needs. Per chart, pt has lost 25lbs(11%) over the past 2 months; this is significant weight loss. There is one documented weight of 250lbs from January. If this weight was correct pt would have lost 20% of his body weight in < 1 year which is significant. Would recommend continue supplements after discharge and until post op healing complete and weight stabilizes.     Medications reviewed and include: Heparin, protonix, NaCl @125ml /hr, cefotan  Labs reviewed: Hgb 9.5(L), Hct 29.6(L) vitamin D 27.8(L)- 11/2018  Diet Order:   Diet Order            Diet full liquid Room service appropriate? Yes; Fluid consistency: Thin  Diet effective now             EDUCATION NEEDS:   Education needs have been addressed  Skin:  Skin Assessment: Reviewed RN Assessment(incision abdomen)  Last  BM:  11/20- type 7 via ostomy  Height:   Ht Readings from Last 1 Encounters:  02/07/19 6\' 4"  (1.93 m)    Weight:   Wt Readings from Last 1 Encounters:  02/07/19 90.6 kg    Ideal Body Weight:  91.8 kg  BMI:  Body mass index is 24.31 kg/m.  Estimated Nutritional Needs:   Kcal:  2400-2700kcal/day  Protein:  >120g/day  Fluid:  >2.7L/day  Koleen Distance MS, RD, LDN Pager #- [phone] Office#- [phone] After Hours Pager: [phone]

## 2019-02-08 MED ORDER — LOPERAMIDE HCL 2 MG PO CAPS
2.0000 mg | ORAL_CAPSULE | Freq: Two times a day (BID) | ORAL | Status: DC
  Administered 2019-02-08 – 2019-02-10 (×5): 2 mg via ORAL
  Filled 2019-02-08 (×5): qty 1

## 2019-02-08 NOTE — Progress Notes (Signed)
CC: s/p LAR Subjective: Doing well Minimal pain Ambulating taking po Ostomy working  Objective: Vital signs in last 24 hours: Temp:  [97.7 F (36.5 C)-98.2 F (36.8 C)] 97.7 F (36.5 C) (11/21 1207) Pulse Rate:  [57-63] 57 (11/21 1207) Resp:  [16-18] 16 (11/21 1207) BP: (107-133)/(63-79) 133/79 (11/21 1207) SpO2:  [98 %-100 %] 100 % (11/21 1207) Last BM Date: 02/07/19  Intake/Output from previous day: 11/20 0701 - 11/21 0700 In: 2021.4 [P.O.:240; I.V.:1581.4; IV Piggyback:200] Out: 1895 [Urine:1750; Drains:140; Stool:5] Intake/Output this shift: Total I/O In: -  Out: 460 [Urine:400; Drains:60]  Physical exam:  NAD, alert Abd: soft, incision c/d/i. Drain in place serous. Ileostomy patent and working. No peritonitis Ext:   Lab Results: CBC  Recent Labs    02/06/19 2212 02/07/19 0606  WBC 11.9* 10.3  HGB 10.6* 9.5*  HCT 32.7* 29.6*  PLT 157 153   BMET Recent Labs    02/06/19 2212 02/07/19 0606  NA  --  139  K  --  4.0  CL  --  107  CO2  --  21*  GLUCOSE  --  175*  BUN  --  14  CREATININE 1.24 0.95  CALCIUM  --  8.0*   PT/INR No results for input(s): LABPROT, INR in the last 72 hours. ABG No results for input(s): PHART, HCO3 in the last 72 hours.  Invalid input(s): PCO2, PO2  Studies/Results: No results found.  Anti-infectives: Anti-infectives (From admission, onward)   Start     Dose/Rate Route Frequency Ordered Stop   02/07/19 0600  cefoTEtan (CEFOTAN) 2 g in sodium chloride 0.9 % 100 mL IVPB     2 g 200 mL/hr over 30 Minutes Intravenous On call to O.R. 02/06/19 2200 02/08/19 0559   02/06/19 1430  cefoTEtan (CEFOTAN) 2 g in sodium chloride 0.9 % 100 mL IVPB  Status:  Discontinued     2 g 200 mL/hr over 30 Minutes Intravenous Every 12 hours 02/06/19 1426 02/08/19 1349   02/06/19 0600  cefoTEtan (CEFOTAN) 2 g in sodium chloride 0.9 % 100 mL IVPB     2 g 200 mL/hr over 30 Minutes Intravenous On call to O.R. 02/06/19 0306 02/06/19 1754       Assessment/Plan:  Doing well advance diet heplock ivf  mobilize  [name], MD, FACS  02/08/2019

## 2019-02-09 MED ORDER — PANTOPRAZOLE SODIUM 40 MG PO TBEC
40.0000 mg | DELAYED_RELEASE_TABLET | Freq: Every day | ORAL | Status: DC
  Administered 2019-02-09: 40 mg via ORAL
  Filled 2019-02-09: qty 1

## 2019-02-09 NOTE — Plan of Care (Signed)
Patient doing well today.  Pain controlled with Tylenol and Toradol.  Incisions are C/D/I.  JP is draining around the tube, MD aware.  Moderate amount of output.  Ileostomy with small amount of liquid stool.  He has ambulated multiple laps around the nurses station.  No significant changes.

## 2019-02-09 NOTE — Progress Notes (Signed)
POD # 3 Doing well Taking po Increase jp output Good UO ambulated  PE NAD Abd: soft, staples in place, no infection, loop ileostomy working  A/P Doing well Regular diet Keep jp until am Ostomy teaching tomorrow and Dc in am

## 2019-02-10 MED ORDER — HYDROCODONE-ACETAMINOPHEN 5-325 MG PO TABS: 1.0000 | ORAL_TABLET | Freq: Four times a day (QID) | ORAL | 0 refills | Status: DC | PRN

## 2019-02-10 MED ORDER — LOPERAMIDE HCL 2 MG PO TABS: 2.0000 mg | ORAL_TABLET | Freq: Four times a day (QID) | ORAL | 0 refills | Status: DC | PRN

## 2019-02-10 NOTE — Consult Note (Signed)
[facility] Nurse ostomy consult note Stoma type/location: RLQ loop ileostomy Stomal assessment/size: 1 and 1/2 inch, os at 3 o'clock. Red rubber catheter used as bridge and sutured together in place. Peristomal assessment: Intact.  Small amount of hair growth returning Treatment options for stomal/peristomal skin: skin barrier ring Output: dark brown runny effluent  Ostomy pouching: 1pc./2pc.  Education provided:  Explained role of ostomy nurse and creation of stoma  Explained stoma characteristics (budded, flush, color, texture, care) Demonstrated pouch change (cutting new skin barrier, measuring stoma, cleaning peristomal skin and stoma, use of barrier ring) Education on emptying when 1/3 to 1/2 full and how to empty Demonstrated use of toilet paper wick to clean spout  Discussed bathing, diet, dehydration  Discussed food blockage   Discussed risk of peristomal hernia Instructed patient to bring ostomy pouching supplies to first MD appointment so that surgeon can remove bridge.  Answered patient questions. Supplies sent home:  5 skin barriers, 5 rings, 5 pouches. Education booklet, measuring guide, teaching sheets on 2-piece pouch application and skin care.  Recommend a few post hospitalization sessions with a HHRN to reinforce ostomy teaching, observe patient performing ostomy care.    Enrolled patient in [place] program: Yes, today. 4 pouches, 4 rings and 4 skin barriers requested.  [facility] nursing team will follow while in house, and will remain available to this patient, the nursing and medical teams.   Thanks, Maudie Flakes, MSN, RN, [place], Arther Abbott  Pager# [phone]

## 2019-02-10 NOTE — Progress Notes (Signed)
Pt dicharged per MD order. IV removed. Discharge instructions reviewed with pt. Pt verbalized understanding. Pt received ostomy supplies from [facility] nurse. Pt take downstairs in wheelchair by staff.

## 2019-02-10 NOTE — TOC Transition Note (Signed)
Transition of Care [facility]) - CM/SW Discharge Note   Patient Details  Name: Keith Trujillo MRN: [id number] Date of Birth: [birth year]  [facility] CM/SW Contact:  Wende Neighbors, LCSW Phone Number: 02/10/2019, 2:08 PM   Clinical Narrative:   CSW spoke with patient at bedside. Patient extremely pleasant and in a good mood. patient stated he feels comfortable going home and is comfortable with the teaching he has received with his new ostomy. CSw stated to patient that [facility] would like him to discharge with [facility] to follow him in the home. Patient stated he is agreeable to that plan. CSW explained that [facility] will be able to take patient for [facility] but wont be able to start care until next week due to the holiday, patient stated he is agreeable to that.           Patient Goals and CMS Choice        Discharge Placement                       Discharge Plan and Services                                     Social Determinants of Health (SDOH) Interventions     Readmission Risk Interventions No flowsheet data found.

## 2019-02-10 NOTE — Discharge Summary (Signed)
Patient ID: Keith Trujillo MRN: [id number] DOB/AGE: 07-22-1960 58 y.o.  Admit date: 02/06/2019 Discharge date: 02/10/2019   Discharge Diagnoses:  Active Problems:   Cancer of sigmoid [facility])   Procedures:Low anterior resection  Hospital Course:  58 year old male with a history of sigmoid cancer scheduled for elective surgery underwent an open low anterior resection with loop ileostomy.  He had a massive mass that he was near obstructed.  He did well and was transferred to the floor postoperatively.   He did well during the postoperative course his Foley was removed and he was able to void.  The drain only put out serous fluid his labs were normal except decrease in hemoglobin expected due to blood loss.  His diet was advanced slowly. At The time of discharge the patient was ambulating,  pain was controlled.  His vital signs were stable and she was afebrile.   physical exam at discharge showed a pt  in no acute distress.  Awake and alert.  Abdomen: Soft incisions healing well without infection or peritonitis, loop ileostomy pink and patent..  Extremities well-perfused and no edema.  Condition of the patient the time of discharge was stable Ostomy nurse was able to educate the patient about loop ileostomy. We will see him in the office in 1 week for removal of red rubber catheter from the ileostomy and staples  Disposition: Discharge disposition: 01-Home or Self Care       Discharge Instructions    Call MD for:  difficulty breathing, headache or visual disturbances   Complete by: As directed    Call MD for:  extreme fatigue   Complete by: As directed    Call MD for:  hives   Complete by: As directed    Call MD for:  persistant dizziness or light-headedness   Complete by: As directed    Call MD for:  persistant nausea and vomiting   Complete by: As directed    Call MD for:  redness, tenderness, or signs of infection (pain, swelling, redness, odor or green/yellow discharge around  incision site)   Complete by: As directed    Call MD for:  severe uncontrolled pain   Complete by: As directed    Call MD for:  temperature >100.4   Complete by: As directed    Diet - low sodium heart healthy   Complete by: As directed    Discharge instructions   Complete by: As directed    Shower daily, no heavy lifting   Increase activity slowly   Complete by: As directed    Lifting restrictions   Complete by: As directed    20 lbs x 6 wks     Allergies as of 02/10/2019      Reactions   Sulfa Antibiotics Rash      Medication List    TAKE these medications   bisacodyl 5 MG EC tablet Commonly known as: DULCOLAX Take all 4 tablets at 8 am the morning prior to your surgery.   erythromycin base 500 MG tablet Commonly known as: E-MYCIN Take 2 tablets at 8am, 2 tablets at 2pm, and 2 tablets at 8pm the day prior to surgery.   HYDROcodone-acetaminophen 5-325 MG tablet Commonly known as: NORCO/VICODIN Take 1-2 tablets by mouth every 6 (six) hours as needed for moderate pain.   IMODIUM PO Take 2 mg by mouth 2 (two) times daily as needed (diarrhea or loose stools). What changed: Another medication with the same name was added. Make sure you understand how  and when to take each.   loperamide 2 MG tablet Commonly known as: Imodium A-D Take 1 tablet (2 mg total) by mouth 4 (four) times daily as needed for diarrhea or loose stools. What changed: You were already taking a medication with the same name, and this prescription was added. Make sure you understand how and when to take each.   neomycin 500 MG tablet Commonly known as: MYCIFRADIN Take 2 tablet at 8am, take 2 tablets at 2pm, and take 2 tablets at 8pm the day prior to your surgery   polyethylene glycol powder 17 GM/SCOOP powder Commonly known as: MiraLax Mix full container in 64 ounces of Gatorade or other clear liquid. NO RED LIQUIDS      Follow-up Information    Tylene Fantasia, PA-C. Go on 02/17/2019.    Specialty: Physician Assistant Why: 11:15am appointment Contact information: [address] [place] [address] [phone]            [name], MD FACS

## 2019-02-17 ENCOUNTER — Other Ambulatory Visit: Payer: Self-pay

## 2019-02-17 ENCOUNTER — Ambulatory Visit (INDEPENDENT_AMBULATORY_CARE_PROVIDER_SITE_OTHER): Payer: 59 | Admitting: Physician Assistant

## 2019-02-17 ENCOUNTER — Telehealth: Payer: Self-pay | Admitting: *Deleted

## 2019-02-17 ENCOUNTER — Encounter: Payer: Self-pay | Admitting: Physician Assistant

## 2019-02-17 VITALS — BP 127/79 | HR 84 | Temp 98.1°F | Resp 12 | Ht 76.0 in | Wt 193.4 lb

## 2019-02-17 DIAGNOSIS — Z09 Encounter for follow-up examination after completed treatment for conditions other than malignant neoplasm: Secondary | ICD-10-CM

## 2019-02-17 DIAGNOSIS — C187 Malignant neoplasm of sigmoid colon: Secondary | ICD-10-CM

## 2019-02-17 LAB — SURGICAL PATHOLOGY

## 2019-02-17 NOTE — Patient Instructions (Addendum)
Please follow up with Oncology.  Please see your follow up appointment listed below. We have removed your staples today and placed Steri-strips. These will begin to fall off in 7-10 days. Please call the office if you have any questions or concerns.   GENERAL POST-OPERATIVE PATIENT INSTRUCTIONS   WOUND CARE INSTRUCTIONS:  Keep a dry clean dressing on the wound if there is drainage. The initial bandage may be removed after 24 hours.  Once the wound has quit draining you may leave it open to air.  If clothing rubs against the wound or causes irritation and the wound is not draining you may cover it with a dry dressing during the daytime.  Try to keep the wound dry and avoid ointments on the wound unless directed to do so.  If the wound becomes bright red and painful or starts to drain infected material that is not clear, please contact your physician immediately.  If the wound is mildly pink and has a thick firm ridge underneath it, this is normal, and is referred to as a healing ridge.  This will resolve over the next 4-6 weeks.  BATHING: You may shower if you have been informed of this by your surgeon. However, Please do not submerge in a tub, hot tub, or pool until incisions are completely sealed or have been told by your surgeon that you may do so.  DIET:  You may eat any foods that you can tolerate.  It is a good idea to eat a high fiber diet and take in plenty of fluids to prevent constipation.  If you do become constipated you may want to take a mild laxative or take ducolax tablets on a daily basis until your bowel habits are regular.  Constipation can be very uncomfortable, along with straining, after recent surgery.  ACTIVITY:  You are encouraged to cough and deep breath or use your incentive spirometer if you were given one, every 15-30 minutes when awake.  This will help prevent respiratory complications and low grade fevers post-operatively if you had a general anesthetic.  You may want to  hug a pillow when coughing and sneezing to add additional support to the surgical area, if you had abdominal or chest surgery, which will decrease pain during these times.  You are encouraged to walk and engage in light activity for the next two weeks.  You should not lift more than 20 pounds, until 03/20/2019 as it could put you at increased risk for complications.  Twenty pounds is roughly equivalent to a plastic bag of groceries. At that time- Listen to your body when lifting, if you have pain when lifting, stop and then try again in a few days. Soreness after doing exercises or activities of daily living is normal as you get back in to your normal routine.  MEDICATIONS:  Try to take narcotic medications and anti-inflammatory medications, such as tylenol, ibuprofen, naprosyn, etc., with food.  This will minimize stomach upset from the medication.  Should you develop nausea and vomiting from the pain medication, or develop a rash, please discontinue the medication and contact your physician.  You should not drive, make important decisions, or operate machinery when taking narcotic pain medication.  SUNBLOCK Use sun block to incision area over the next year if this area will be exposed to sun. This helps decrease scarring and will allow you avoid a permanent darkened area over your incision.  QUESTIONS:  Please feel free to call our office if you have any  questions, and we will be glad to assist you. [phone]

## 2019-02-17 NOTE — Telephone Encounter (Signed)
MRI called stating they have questions regarding the MRI 2 orders and one is a venous thing that needs to be cancelled Please return their call for further details

## 2019-02-17 NOTE — Progress Notes (Signed)
[facility] POST-OP OFFICE VISIT  02/17/2019  HPI: Keith Trujillo is a 58 y.o. male 11 days s/p open LAR with diverting loop ileostomy for sigmoid colon CA with Dr Dahlia Byes.   Doing well No issues with pain No nausea, emesis. He is tolerating PO without issue No trouble with ileostomy function or changing appliance Ambulating well   Vital signs: There were no vitals taken for this visit.   Physical Exam: Constitutional: Well appearing male, NAD Abdomen: Soft, non-tender, non-distended, no rebound/guarding. Ileostomy in RLQ patent, red rubber removed Skin: Laparotomy incision is CDI with staples (removed), no erythema or drianage  Assessment/Plan: This is a 58 y.o. male 1 days s/p open LAR with diverting loop ileostomy for sigmoid colon CA   - pain control prn  - staples and red rubber removed; reviewed wound care  - lifting restrictions reviewed  - discussed pathology briefly; he will follow up with oncology on 12/04  - rtc in 2 weeks  -- Edison Simon, PA-C [facility] 02/17/2019, 11:03 AM [phone] M-F: 7am - 4pm

## 2019-02-18 ENCOUNTER — Other Ambulatory Visit: Payer: Self-pay

## 2019-02-18 ENCOUNTER — Ambulatory Visit
Admission: RE | Admit: 2019-02-18 | Discharge: 2019-02-18 | Disposition: A | Discharge status: Home / Self Care | Payer: 59 | Source: Ambulatory Visit | Attending: Oncology | Admitting: Oncology

## 2019-02-18 DIAGNOSIS — R932 Abnormal findings on diagnostic imaging of liver and biliary tract: Secondary | ICD-10-CM

## 2019-02-18 DIAGNOSIS — Z87891 Personal history of nicotine dependence: Secondary | ICD-10-CM | POA: Diagnosis present

## 2019-02-18 DIAGNOSIS — C189 Malignant neoplasm of colon, unspecified: Secondary | ICD-10-CM | POA: Diagnosis not present

## 2019-02-18 MED ORDER — IOHEXOL 300 MG/ML  SOLN
75.0000 mL | Freq: Once | INTRAMUSCULAR | Status: AC | PRN
  Administered 2019-02-18: 13:00:00 75 mL via INTRAVENOUS

## 2019-02-19 ENCOUNTER — Ambulatory Visit
Admission: RE | Admit: 2019-02-19 | Discharge: 2019-02-19 | Disposition: A | Discharge status: Home / Self Care | Payer: 59 | Source: Ambulatory Visit | Attending: Oncology | Admitting: Oncology

## 2019-02-19 ENCOUNTER — Ambulatory Visit: Payer: 59

## 2019-02-19 DIAGNOSIS — C189 Malignant neoplasm of colon, unspecified: Secondary | ICD-10-CM | POA: Diagnosis present

## 2019-02-19 DIAGNOSIS — R932 Abnormal findings on diagnostic imaging of liver and biliary tract: Secondary | ICD-10-CM | POA: Diagnosis present

## 2019-02-19 DIAGNOSIS — Z87891 Personal history of nicotine dependence: Secondary | ICD-10-CM | POA: Insufficient documentation

## 2019-02-19 LAB — POCT I-STAT CREATININE: Creatinine, Ser: 0.8 mg/dL (ref 0.61–1.24)

## 2019-02-19 MED ORDER — GADOBUTROL 1 MMOL/ML IV SOLN
8.0000 mL | Freq: Once | INTRAVENOUS | Status: AC | PRN
  Administered 2019-02-19: 8 mL via INTRAVENOUS

## 2019-02-21 ENCOUNTER — Other Ambulatory Visit: Payer: Self-pay

## 2019-02-21 ENCOUNTER — Inpatient Hospital Stay: Payer: 59 | Attending: Oncology | Admitting: Oncology

## 2019-02-21 ENCOUNTER — Encounter: Payer: Self-pay | Admitting: Oncology

## 2019-02-21 ENCOUNTER — Inpatient Hospital Stay: Payer: 59

## 2019-02-21 VITALS — BP 139/72 | HR 67 | Temp 96.9°F | Wt 192.0 lb

## 2019-02-21 DIAGNOSIS — R109 Unspecified abdominal pain: Secondary | ICD-10-CM | POA: Insufficient documentation

## 2019-02-21 DIAGNOSIS — F17210 Nicotine dependence, cigarettes, uncomplicated: Secondary | ICD-10-CM | POA: Insufficient documentation

## 2019-02-21 DIAGNOSIS — Z7189 Other specified counseling: Secondary | ICD-10-CM

## 2019-02-21 DIAGNOSIS — R197 Diarrhea, unspecified: Secondary | ICD-10-CM | POA: Diagnosis not present

## 2019-02-21 DIAGNOSIS — C187 Malignant neoplasm of sigmoid colon: Secondary | ICD-10-CM | POA: Diagnosis not present

## 2019-02-21 DIAGNOSIS — Z5111 Encounter for antineoplastic chemotherapy: Secondary | ICD-10-CM | POA: Diagnosis present

## 2019-02-21 DIAGNOSIS — C189 Malignant neoplasm of colon, unspecified: Secondary | ICD-10-CM

## 2019-02-21 LAB — CBC WITH DIFFERENTIAL/PLATELET
Abs Immature Granulocytes: 0.02 10*3/uL (ref 0.00–0.07)
Basophils Absolute: 0.1 10*3/uL (ref 0.0–0.1)
Basophils Relative: 1 %
Eosinophils Absolute: 0.4 10*3/uL (ref 0.0–0.5)
Eosinophils Relative: 6 %
HCT: 33.3 % — ABNORMAL LOW (ref 39.0–52.0)
Hemoglobin: 10.5 g/dL — ABNORMAL LOW (ref 13.0–17.0)
Immature Granulocytes: 0 %
Lymphocytes Relative: 14 %
Lymphs Abs: 0.8 10*3/uL (ref 0.7–4.0)
MCH: 31.4 pg (ref 26.0–34.0)
MCHC: 31.5 g/dL (ref 30.0–36.0)
MCV: 99.7 fL (ref 80.0–100.0)
Monocytes Absolute: 0.3 10*3/uL (ref 0.1–1.0)
Monocytes Relative: 5 %
Neutro Abs: 4.5 10*3/uL (ref 1.7–7.7)
Neutrophils Relative %: 74 %
Platelets: 216 10*3/uL (ref 150–400)
RBC: 3.34 MIL/uL — ABNORMAL LOW (ref 4.22–5.81)
RDW: 14.3 % (ref 11.5–15.5)
WBC: 6.1 10*3/uL (ref 4.0–10.5)
nRBC: 0 % (ref 0.0–0.2)

## 2019-02-21 LAB — COMPREHENSIVE METABOLIC PANEL
ALT: 26 U/L (ref 0–44)
AST: 19 U/L (ref 15–41)
Albumin: 3.9 g/dL (ref 3.5–5.0)
Alkaline Phosphatase: 72 U/L (ref 38–126)
Anion gap: 8 (ref 5–15)
BUN: 16 mg/dL (ref 6–20)
CO2: 28 mmol/L (ref 22–32)
Calcium: 9.2 mg/dL (ref 8.9–10.3)
Chloride: 101 mmol/L (ref 98–111)
Creatinine, Ser: 0.77 mg/dL (ref 0.61–1.24)
GFR calc Af Amer: >60 mL/min (ref >60)
GFR calc non Af Amer: >60 mL/min (ref >60)
Glucose, Bld: 109 mg/dL — ABNORMAL HIGH (ref 70–99)
Potassium: 4.4 mmol/L (ref 3.5–5.1)
Sodium: 137 mmol/L (ref 135–145)
Total Bilirubin: 0.5 mg/dL (ref 0.3–1.2)
Total Protein: 6.4 g/dL — ABNORMAL LOW (ref 6.5–8.1)

## 2019-02-21 LAB — IRON AND TIBC
Iron: 62 ug/dL (ref 45–182)
Saturation Ratios: 19 % (ref 17.9–39.5)
TIBC: 323 ug/dL (ref 250–450)
UIBC: 261 ug/dL

## 2019-02-21 LAB — FERRITIN: Ferritin: 89 ng/mL (ref 24–336)

## 2019-02-21 NOTE — Progress Notes (Signed)
Patient stated that he had been doing well with no complaints. Patient had his surgery done on 02/06/2019 by Dr. Dahlia Byes and everything went well.

## 2019-02-22 LAB — CEA: CEA: 8.1 ng/mL — ABNORMAL HIGH (ref 0.0–4.7)

## 2019-02-24 MED ORDER — ONDANSETRON HCL 8 MG PO TABS: 8.0000 mg | ORAL_TABLET | Freq: Two times a day (BID) | ORAL | 1 refills | Status: DC | PRN

## 2019-02-24 MED ORDER — PROCHLORPERAZINE MALEATE 10 MG PO TABS: 10.0000 mg | ORAL_TABLET | Freq: Four times a day (QID) | ORAL | 1 refills | Status: DC | PRN

## 2019-02-24 MED ORDER — LIDOCAINE-PRILOCAINE 2.5-2.5 % EX CREA: TOPICAL_CREAM | CUTANEOUS | 3 refills | Status: DC

## 2019-02-24 MED ORDER — DEXAMETHASONE 4 MG PO TABS: 8.0000 mg | ORAL_TABLET | Freq: Every day | ORAL | 1 refills | Status: DC

## 2019-02-24 NOTE — Progress Notes (Signed)
Hematology/Oncology Consult note [facility]  Telephone:([phone] Fax:([phone]  Patient Care Team: Kendell Bane, NP as PCP - General (Adult Health Nurse Practitioner) Clent Jacks, RN as Oncology Nurse Navigator   Name of the patient: Knut Rondinelli  [id number]  06-20-1960   Date of visit: 02/24/19  Diagnosis-stage IIIb colon adenocarcinoma  Chief complaint/ Reason for visit-discuss final pathology results and further management  Heme/Onc history: patient is a 58 year old male who was seen by Dr. Leonides Schanz from GI for symptoms of diarrhea as well as abdominal pain.He underwent a colonoscopy on 01/02/2019 which showed an ulcerated partially obstructing large mass in the sigmoid colon which measured 10 cm in length biopsy showed adenocarcinoma moderately differentiated.  Baseline CEA elevated at 71.7.  He was seen by Dr. Dahlia Byes and will be undergoing laparoscopic hemicolectomy and lymph node sampling next week.  CT abdomen pelvis with contrast showed irregular marked circumferential wall thickening in the sigmoid colon near the rectosigmoid junction.  The lesion longitudinally extends for approximately 5 to 6 cm.  4.1 x 2.8 cm ill-defined irregular paracolonic soft tissue mass may represent direct tumor extension or contiguous markedly enlarged lymph node.  Abnormal lymph nodes also seen in the perirectal and presacral space concerning for metastatic disease.  1.5 cm ill-defined hypoattenuating lesion in the medial segment of the left liver which may be focal fatty deposition but cannot be definitively characterized.  MRI did not show any evidence of liver metastases.  CT chest was negative for metastatic disease.  Patient underwent a low anterior resection of the sigmoid mass.  Final pathology showed 8 cm invasive colorectal adenocarcinoma grade 2.  Tumor invades through the muscularis propria into pericolorectal tissue.  All margins are  uninvolvedLymphovascular invasion and perineural invasion present.  Lymph nodes 1+ out of 20 PT3PN1A.  MSI stable  Interval history-patient is recovering well from his recent surgery.  His bowels are moving well.  Denies any significant abdominal pain  ECOG PS- 1 Pain scale- 0 Opioid associated constipation- no  Review of systems- Review of Systems  Constitutional: Positive for malaise/fatigue. Negative for chills, fever and weight loss.  HENT: Negative for congestion, ear discharge and nosebleeds.   Eyes: Negative for blurred vision.  Respiratory: Negative for cough, hemoptysis, sputum production, shortness of breath and wheezing.   Cardiovascular: Negative for chest pain, palpitations, orthopnea and claudication.  Gastrointestinal: Negative for abdominal pain, blood in stool, constipation, diarrhea, heartburn, melena, nausea and vomiting.  Genitourinary: Negative for dysuria, flank pain, frequency, hematuria and urgency.  Musculoskeletal: Negative for back pain, joint pain and myalgias.  Skin: Negative for rash.  Neurological: Negative for dizziness, tingling, focal weakness, seizures, weakness and headaches.  Endo/Heme/Allergies: Does not bruise/bleed easily.  Psychiatric/Behavioral: Negative for depression and suicidal ideas. The patient does not have insomnia.     Allergies  Allergen Reactions  . Sulfa Antibiotics Rash     Past Medical History:  Diagnosis Date  . Abdominal pain   . Allergy   . Cancer ([place])   . Cellulitis   . Dyspnea   . Liver spot   . Loose stools   . Weight loss      Past Surgical History:  Procedure Laterality Date  . COLONOSCOPY WITH PROPOFOL N/A 01/02/2019   Procedure: COLONOSCOPY WITH BIOPSY;  Surgeon: [name], MD;  Location: [place];  Service: Endoscopy;  Laterality: N/A;  Clip x2 at Biopsy Site  . COLOSTOMY Right 02/06/2019   Procedure: COLOSTOMY;  Surgeon: [name], MD;  Location: [facility];  Service: General;   Laterality: Right;  . excision of skin lesion    . LAPAROSCOPIC SIGMOID COLECTOMY N/A 02/06/2019   Procedure: LAPAROSCOPIC SIGMOID COLECTOMY converted to open procedure;  Surgeon: [name], MD;  Location: [facility];  Service: General;  Laterality: N/A;  . TONSILLECTOMY AND ADENOIDECTOMY  1969    Social History   Socioeconomic History  . Marital status: Married    Spouse name: Not on file  . Number of children: Not on file  . Years of education: Not on file  . Highest education level: Not on file  Occupational History  . Not on file  Social Needs  . Financial resource strain: Not on file  . Food insecurity    Worry: Not on file    Inability: Not on file  . Transportation needs    Medical: Not on file    Non-medical: Not on file  Tobacco Use  . Smoking status: Light Tobacco Smoker    Types: Cigars  . Smokeless tobacco: Former Systems developer    Quit date: 1980  Substance and Sexual Activity  . Alcohol use: Yes    Comment: rare  . Drug use: Never  . Sexual activity: Yes  Lifestyle  . Physical activity    Days per week: Not on file    Minutes per session: Not on file  . Stress: Not on file  Relationships  . Social Herbalist on phone: Not on file    Gets together: Not on file    Attends religious service: Not on file    Active member of club or organization: Not on file    Attends meetings of clubs or organizations: Not on file    Relationship status: Not on file  . Intimate partner violence    Fear of current or ex partner: Not on file    Emotionally abused: Not on file    Physically abused: Not on file    Forced sexual activity: Not on file  Other Topics Concern  . Not on file  Social History Narrative  . Not on file    Family History  Problem Relation Age of Onset  . Lung cancer Father      Current Outpatient Medications:  .  Loperamide HCl (IMODIUM PO), Take 2 mg by mouth 2 (two) times daily as needed (diarrhea or loose stools). , Disp: , Rfl:    Physical exam:  Vitals:   02/21/19 1333  BP: 139/72  Pulse: 67  Temp: (!) 96.9 F (36.1 C)  TempSrc: Tympanic  Weight: 192 lb (87.1 kg)   Physical Exam Constitutional:      General: He is not in acute distress. HENT:     Head: Normocephalic and atraumatic.  Eyes:     Pupils: Pupils are equal, round, and reactive to light.  Neck:     Musculoskeletal: Normal range of motion.  Cardiovascular:     Rate and Rhythm: Normal rate and regular rhythm.     Heart sounds: Normal heart sounds.  Pulmonary:     Effort: Pulmonary effort is normal.     Breath sounds: Normal breath sounds.  Abdominal:     General: Bowel sounds are normal.     Palpations: Abdomen is soft.  Skin:    General: Skin is warm and dry.  Neurological:     Mental Status: He is alert and oriented to person, place, and time.  CMP Latest Ref Rng & Units 02/21/2019  Glucose 70 - 99 mg/dL 109(H)  BUN 6 - 20 mg/dL 16  Creatinine 0.61 - 1.24 mg/dL 0.77  Sodium 135 - 145 mmol/L 137  Potassium 3.5 - 5.1 mmol/L 4.4  Chloride 98 - 111 mmol/L 101  CO2 22 - 32 mmol/L 28  Calcium 8.9 - 10.3 mg/dL 9.2  Total Protein 6.5 - 8.1 g/dL 6.4(L)  Total Bilirubin 0.3 - 1.2 mg/dL 0.5  Alkaline Phos 38 - 126 U/L 72  AST 15 - 41 U/L 19  ALT 0 - 44 U/L 26   CBC Latest Ref Rng & Units 02/21/2019  WBC 4.0 - 10.5 K/uL 6.1  Hemoglobin 13.0 - 17.0 g/dL 10.5(L)  Hematocrit 39.0 - 52.0 % 33.3(L)  Platelets 150 - 400 K/uL 216    No images are attached to the encounter.  Ct Chest W Contrast  Result Date: 02/18/2019 CLINICAL DATA:  Staging workup for colon cancer. EXAM: CT CHEST WITH CONTRAST TECHNIQUE: Multidetector CT imaging of the chest was performed during intravenous contrast administration. CONTRAST:  77m OMNIPAQUE IOHEXOL 300 MG/ML  SOLN COMPARISON:  None. FINDINGS: Cardiovascular: The heart size appears within normal limits. No pericardial effusion identified. Mediastinum/Nodes: No enlarged mediastinal, hilar, or axillary  lymph nodes. Thyroid gland, trachea, and esophagus demonstrate no significant findings. Lungs/Pleura: No pleural effusion identified. No airspace consolidation, atelectasis or pneumothorax. Calcified granuloma identified within the right midlung, image 69/100. Noncalcified left lower lobe lung nodule measures 3 mm, image 122/3. 3 mm lingular nodule is also noted, image 113/3. Calcified nodule in the lingula. Upper Abdomen: Pneumoperitoneum identified, likely postoperative. Musculoskeletal: No chest wall abnormality. No acute or significant osseous findings. IMPRESSION: 1. No specific findings identified to suggest metastatic disease to the chest. 2. Two small nodules are identified within the lingula and left lower lobe, nonspecific. These measure up to 3 mm. Attention on follow-up imaging advised. 3. Prior granulomatous disease. 4. Pneumoperitoneum, likely postoperative. Electronically Signed   By: TKerby MoorsM.D.   On: 02/18/2019 14:08   Mr Abdomen W Wo Contrast  Result Date: 02/19/2019 CLINICAL DATA:  Colon cancer with liver lesions seen on previous CT. EXAM: MRI ABDOMEN WITHOUT AND WITH CONTRAST TECHNIQUE: Multiplanar multisequence MR imaging of the abdomen was performed both before and after the administration of intravenous contrast. CONTRAST:  858mGADAVIST GADOBUTROL 1 MMOL/ML IV SOLN COMPARISON:  CT scan 01/21/2019 FINDINGS: Lower chest: Unremarkable. Hepatobiliary: Small focus of differential signal again identified in the medial segment left liver, adjacent to the falciform ligament, corresponding to the lesion identified on previous CT scan. This lesion loses signal intensity on out of phase T1 imaging compared to the in phase sequence, compatible with the presence of intralesional lipid. No evidence for enhancement after IV contrast administration. Scattered very tiny foci of signal void are identified along the dome of liver, between the liver in the hemidiaphragm. These are compatible with tiny  gas bubbles as seen on previous CT of 02/18/2019. There is also some intraperitoneal free air identified anterior to the liver. Patient underwent right sigmoid colectomy on 02/06/2019 and while free gas is not unexpected 7-10 days after surgery, residual free gas at 13 days postop is somewhat atypical. There is no evidence for gallstones, gallbladder wall thickening, or pericholecystic fluid. No intrahepatic or extrahepatic biliary dilation. Pancreas: No focal mass lesion. No dilatation of the main duct. No intraparenchymal cyst. No peripancreatic edema. Spleen:  No splenomegaly. No focal mass lesion. Adrenals/Urinary Tract: No adrenal nodule or  mass. Kidneys unremarkable. Stomach/Bowel: Stomach is unremarkable. No gastric wall thickening. No evidence of outlet obstruction. Duodenum is normally positioned as is the ligament of Treitz. No small bowel or colonic dilatation within the visualized abdomen. Vascular/Lymphatic: No abdominal aortic aneurysm. No abdominal lymphadenopathy. Other: There is some edema and trace fluid noted in the left upper quadrant around the spleen. Musculoskeletal: No abnormal marrow enhancement within the visualized bony anatomy. IMPRESSION: 1. Lesion of concern in the medial segment left liver on previous CT of 01/21/2019 represents focal fatty deposition. No evidence for metastatic disease within the liver parenchyma. 2. Residual intraperitoneal free air in this patient 13 days out from surgery. While intraperitoneal free gas is not unexpected 7-10 days from surgery, residual gas on postoperative day 13 is considered atypical. Electronically Signed   By: Misty Stanley M.D.   On: 02/19/2019 11:46     Assessment and plan- Patient is a 58 y.o. male with sigmoid adenocarcinoma stage III BPT3PN1A s/p low anterior resection here to discuss final pathology results and further management  Discussed results of final pathology with the patient in detail which showed 8 cm moderately  differentiated adenocarcinoma T3, 1 out of 20 lymph nodes were positive.  All margins were negative.  He has stage IIIb disease.  I would recommend adjuvant chemotherapy with FOLFOX given IV every 2 weeks for 12 cycles.  Patient will come on day 3 for pump disconnect.  MOSAIC trial in stage III colon cancer showed improvement in progression free survival with adjuvant chemotherapy in stage III disease.  At the median follow-up of 82 months 5-year disease-free survival was significantly higher with FOLFOX 73% versus 67%.  6-year overall survival rate was also higher in patients with adjuvant chemotherapy   Given that this tumor was fairly low close to the rectum I will discuss his case at tumor board and there would be any role for adjuvant radiation treatment after chemotherapy is completed  I have also reviewed MRI and CT chest images independently and discussed findings with the patient.  There was some concern for hypoattenuating lesion in the medial segment of the left liver.  However this was not concerning for metastases on MRI and more consistent with Focal fatty deposition.  CT chest showed no evidence of metastatic disease.  Discussed risks and benefits of chemotherapy including all but not limited to nausea vomiting, low blood counts, risk of infections and hospitalization.  Risk of peripheral neuropathy associated with oxaliplatin.  Patient understands and agrees to proceed as planned.  Patient will need a port placement prior to chemotherapy.  He will also need chemotherapy teach.  Treatment will be given with a curative intent.  I will see him on 12/28 or 03/18/2019 with port labs CBC with differential, CMP and CEA to start first cycle of FOLFOX chemotherapy  Cancer Staging Cancer of sigmoid [facility]) Staging form: Colon and Rectum, AJCC 8th Edition - Pathologic stage from 02/21/2019: Stage IIIB (pT3, pN1a, cM0) - Signed by Sindy Guadeloupe, MD on 02/24/2019     Visit Diagnosis 1. Cancer of  sigmoid ([place])   2. Goals of care, counseling/discussion      Dr. Randa Evens, MD, MPH [facility] [phone] 02/24/2019 12:11 PM

## 2019-02-24 NOTE — Progress Notes (Signed)
START OFF PATHWAY REGIMEN - Colorectal   [id number]:FOLFOX (q14d) **2 cycles per order sheet**:   A cycle is every 14 days:     Oxaliplatin      Leucovorin      Fluorouracil      Fluorouracil   **Always confirm dose/schedule in your pharmacy ordering system**  Patient Characteristics: Postoperative without Neoadjuvant Therapy (Pathologic Staging), Colon, Stage III, Low Risk (pT1-3, pN1) Tumor Location: Colon Therapeutic Status: Postoperative without Neoadjuvant Therapy (Pathologic Staging) AJCC M Category: cM0 AJCC T Category: pT3 AJCC N Category: pN1a AJCC 8 Stage Grouping: IIIB Intent of Therapy: Curative Intent, Discussed with Patient

## 2019-02-25 NOTE — Patient Instructions (Signed)
Oxaliplatin Injection What is this medicine? OXALIPLATIN (ox AL i PLA tin) is a chemotherapy drug. It targets fast dividing cells, like cancer cells, and causes these cells to die. This medicine is used to treat cancers of the colon and rectum, and many other cancers. This medicine may be used for other purposes; ask your health care provider or pharmacist if you have questions. COMMON BRAND NAME(S): Eloxatin What should I tell my health care provider before I take this medicine? They need to know if you have any of these conditions:  kidney disease  an unusual or allergic reaction to oxaliplatin, other chemotherapy, other medicines, foods, dyes, or preservatives  pregnant or trying to get pregnant  breast-feeding How should I use this medicine? This drug is given as an infusion into a vein. It is administered in a hospital or clinic by a specially trained health care professional. Talk to your pediatrician regarding the use of this medicine in children. Special care may be needed. Overdosage: If you think you have taken too much of this medicine contact a poison control center or emergency room at once. NOTE: This medicine is only for you. Do not share this medicine with others. What if I miss a dose? It is important not to miss a dose. Call your doctor or health care professional if you are unable to keep an appointment. What may interact with this medicine?  medicines to increase blood counts like filgrastim, pegfilgrastim, sargramostim  probenecid  some antibiotics like amikacin, gentamicin, neomycin, polymyxin B, streptomycin, tobramycin  zalcitabine Talk to your doctor or health care professional before taking any of these medicines:  acetaminophen  aspirin  ibuprofen  ketoprofen  naproxen This list may not describe all possible interactions. Give your health care provider a list of all the medicines, herbs, non-prescription drugs, or dietary supplements you use. Also  tell them if you smoke, drink alcohol, or use illegal drugs. Some items may interact with your medicine. What should I watch for while using this medicine? Your condition will be monitored carefully while you are receiving this medicine. You will need important blood work done while you are taking this medicine. This medicine can make you more sensitive to cold. Do not drink cold drinks or use ice. Cover exposed skin before coming in contact with cold temperatures or cold objects. When out in cold weather wear warm clothing and cover your mouth and nose to warm the air that goes into your lungs. Tell your doctor if you get sensitive to the cold. This drug may make you feel generally unwell. This is not uncommon, as chemotherapy can affect healthy cells as well as cancer cells. Report any side effects. Continue your course of treatment even though you feel ill unless your doctor tells you to stop. In some cases, you may be given additional medicines to help with side effects. Follow all directions for their use. Call your doctor or health care professional for advice if you get a fever, chills or sore throat, or other symptoms of a cold or flu. Do not treat yourself. This drug decreases your body's ability to fight infections. Try to avoid being around people who are sick. This medicine may increase your risk to bruise or bleed. Call your doctor or health care professional if you notice any unusual bleeding. Be careful brushing and flossing your teeth or using a toothpick because you may get an infection or bleed more easily. If you have any dental work done, tell your dentist you   are receiving this medicine. Avoid taking products that contain aspirin, acetaminophen, ibuprofen, naproxen, or ketoprofen unless instructed by your doctor. These medicines may hide a fever. Do not become pregnant while taking this medicine. Women should inform their doctor if they wish to become pregnant or think they might be  pregnant. There is a potential for serious side effects to an unborn child. Talk to your health care professional or pharmacist for more information. Do not breast-feed an infant while taking this medicine. Call your doctor or health care professional if you get diarrhea. Do not treat yourself. What side effects may I notice from receiving this medicine? Side effects that you should report to your doctor or health care professional as soon as possible:  allergic reactions like skin rash, itching or hives, swelling of the face, lips, or tongue  low blood counts - This drug may decrease the number of white blood cells, red blood cells and platelets. You may be at increased risk for infections and bleeding.  signs of infection - fever or chills, cough, sore throat, pain or difficulty passing urine  signs of decreased platelets or bleeding - bruising, pinpoint red spots on the skin, black, tarry stools, nosebleeds  signs of decreased red blood cells - unusually weak or tired, fainting spells, lightheadedness  breathing problems  chest pain, pressure  cough  diarrhea  jaw tightness  mouth sores  nausea and vomiting  pain, swelling, redness or irritation at the injection site  pain, tingling, numbness in the hands or feet  problems with balance, talking, walking  redness, blistering, peeling or loosening of the skin, including inside the mouth  trouble passing urine or change in the amount of urine Side effects that usually do not require medical attention (report to your doctor or health care professional if they continue or are bothersome):  changes in vision  constipation  hair loss  loss of appetite  metallic taste in the mouth or changes in taste  stomach pain This list may not describe all possible side effects. Call your doctor for medical advice about side effects. You may report side effects to FDA at 1-800-FDA-1088. Where should I keep my medicine? This drug  is given in a hospital or clinic and will not be stored at home. NOTE: This sheet is a summary. It may not cover all possible information. If you have questions about this medicine, talk to your doctor, pharmacist, or health care provider.  2020 Elsevier/Gold Standard (2007-10-01 17:22:47) Fluorouracil, 5-FU injection What is this medicine? FLUOROURACIL, 5-FU (flure oh YOOR a sil) is a chemotherapy drug. It slows the growth of cancer cells. This medicine is used to treat many types of cancer like breast cancer, colon or rectal cancer, pancreatic cancer, and stomach cancer. This medicine may be used for other purposes; ask your health care provider or pharmacist if you have questions. COMMON BRAND NAME(S): Adrucil What should I tell my health care provider before I take this medicine? They need to know if you have any of these conditions:  blood disorders  dihydropyrimidine dehydrogenase (DPD) deficiency  infection (especially a virus infection such as chickenpox, cold sores, or herpes)  kidney disease  liver disease  malnourished, poor nutrition  recent or ongoing radiation therapy  an unusual or allergic reaction to fluorouracil, other chemotherapy, other medicines, foods, dyes, or preservatives  pregnant or trying to get pregnant  breast-feeding How should I use this medicine? This drug is given as an infusion or injection into a vein.   It is administered in a hospital or clinic by a specially trained health care professional. Talk to your pediatrician regarding the use of this medicine in children. Special care may be needed. Overdosage: If you think you have taken too much of this medicine contact a poison control center or emergency room at once. NOTE: This medicine is only for you. Do not share this medicine with others. What if I miss a dose? It is important not to miss your dose. Call your doctor or health care professional if you are unable to keep an appointment. What  may interact with this medicine?  allopurinol  cimetidine  dapsone  digoxin  hydroxyurea  leucovorin  levamisole  medicines for seizures like ethotoin, fosphenytoin, phenytoin  medicines to increase blood counts like filgrastim, pegfilgrastim, sargramostim  medicines that treat or prevent blood clots like warfarin, enoxaparin, and dalteparin  methotrexate  metronidazole  pyrimethamine  some other chemotherapy drugs like busulfan, cisplatin, estramustine, vinblastine  trimethoprim  trimetrexate  vaccines Talk to your doctor or health care professional before taking any of these medicines:  acetaminophen  aspirin  ibuprofen  ketoprofen  naproxen This list may not describe all possible interactions. Give your health care provider a list of all the medicines, herbs, non-prescription drugs, or dietary supplements you use. Also tell them if you smoke, drink alcohol, or use illegal drugs. Some items may interact with your medicine. What should I watch for while using this medicine? Visit your doctor for checks on your progress. This drug may make you feel generally unwell. This is not uncommon, as chemotherapy can affect healthy cells as well as cancer cells. Report any side effects. Continue your course of treatment even though you feel ill unless your doctor tells you to stop. In some cases, you may be given additional medicines to help with side effects. Follow all directions for their use. Call your doctor or health care professional for advice if you get a fever, chills or sore throat, or other symptoms of a cold or flu. Do not treat yourself. This drug decreases your body's ability to fight infections. Try to avoid being around people who are sick. This medicine may increase your risk to bruise or bleed. Call your doctor or health care professional if you notice any unusual bleeding. Be careful brushing and flossing your teeth or using a toothpick because you may  get an infection or bleed more easily. If you have any dental work done, tell your dentist you are receiving this medicine. Avoid taking products that contain aspirin, acetaminophen, ibuprofen, naproxen, or ketoprofen unless instructed by your doctor. These medicines may hide a fever. Do not become pregnant while taking this medicine. Women should inform their doctor if they wish to become pregnant or think they might be pregnant. There is a potential for serious side effects to an unborn child. Talk to your health care professional or pharmacist for more information. Do not breast-feed an infant while taking this medicine. Men should inform their doctor if they wish to father a child. This medicine may lower sperm counts. Do not treat diarrhea with over the counter products. Contact your doctor if you have diarrhea that lasts more than 2 days or if it is severe and watery. This medicine can make you more sensitive to the sun. Keep out of the sun. If you cannot avoid being in the sun, wear protective clothing and use sunscreen. Do not use sun lamps or tanning beds/booths. What side effects may I notice from   receiving this medicine? Side effects that you should report to your doctor or health care professional as soon as possible:  allergic reactions like skin rash, itching or hives, swelling of the face, lips, or tongue  low blood counts - this medicine may decrease the number of white blood cells, red blood cells and platelets. You may be at increased risk for infections and bleeding.  signs of infection - fever or chills, cough, sore throat, pain or difficulty passing urine  signs of decreased platelets or bleeding - bruising, pinpoint red spots on the skin, black, tarry stools, blood in the urine  signs of decreased red blood cells - unusually weak or tired, fainting spells, lightheadedness  breathing problems  changes in vision  chest pain  mouth sores  nausea and vomiting  pain,  swelling, redness at site where injected  pain, tingling, numbness in the hands or feet  redness, swelling, or sores on hands or feet  stomach pain  unusual bleeding Side effects that usually do not require medical attention (report to your doctor or health care professional if they continue or are bothersome):  changes in finger or toe nails  diarrhea  dry or itchy skin  hair loss  headache  loss of appetite  sensitivity of eyes to the light  stomach upset  unusually teary eyes This list may not describe all possible side effects. Call your doctor for medical advice about side effects. You may report side effects to FDA at 1-800-FDA-1088. Where should I keep my medicine? This drug is given in a hospital or clinic and will not be stored at home. NOTE: This sheet is a summary. It may not cover all possible information. If you have questions about this medicine, talk to your doctor, pharmacist, or health care provider.  2020 Elsevier/Gold Standard (2007-07-10 13:53:16) Leucovorin injection What is this medicine? LEUCOVORIN (loo koe VOR in) is used to prevent or treat the harmful effects of some medicines. This medicine is used to treat anemia caused by a low amount of folic acid in the body. It is also used with 5-fluorouracil (5-FU) to treat colon cancer. This medicine may be used for other purposes; ask your health care provider or pharmacist if you have questions. What should I tell my health care provider before I take this medicine? They need to know if you have any of these conditions:  anemia from low levels of vitamin B-12 in the blood  an unusual or allergic reaction to leucovorin, folic acid, other medicines, foods, dyes, or preservatives  pregnant or trying to get pregnant  breast-feeding How should I use this medicine? This medicine is for injection into a muscle or into a vein. It is given by a health care professional in a hospital or clinic setting. Talk  to your pediatrician regarding the use of this medicine in children. Special care may be needed. Overdosage: If you think you have taken too much of this medicine contact a poison control center or emergency room at once. NOTE: This medicine is only for you. Do not share this medicine with others. What if I miss a dose? This does not apply. What may interact with this medicine?  capecitabine  fluorouracil  phenobarbital  phenytoin  primidone  trimethoprim-sulfamethoxazole This list may not describe all possible interactions. Give your health care provider a list of all the medicines, herbs, non-prescription drugs, or dietary supplements you use. Also tell them if you smoke, drink alcohol, or use illegal drugs. Some items may   interact with your medicine. What should I watch for while using this medicine? Your condition will be monitored carefully while you are receiving this medicine. This medicine may increase the side effects of 5-fluorouracil, 5-FU. Tell your doctor or health care professional if you have diarrhea or mouth sores that do not get better or that get worse. What side effects may I notice from receiving this medicine? Side effects that you should report to your doctor or health care professional as soon as possible:  allergic reactions like skin rash, itching or hives, swelling of the face, lips, or tongue  breathing problems  fever, infection  mouth sores  unusual bleeding or bruising  unusually weak or tired Side effects that usually do not require medical attention (report to your doctor or health care professional if they continue or are bothersome):  constipation or diarrhea  loss of appetite  nausea, vomiting This list may not describe all possible side effects. Call your doctor for medical advice about side effects. You may report side effects to FDA at 1-800-FDA-1088. Where should I keep my medicine? This drug is given in a hospital or clinic and  will not be stored at home. NOTE: This sheet is a summary. It may not cover all possible information. If you have questions about this medicine, talk to your doctor, pharmacist, or health care provider.  2020 Elsevier/Gold Standard (2007-09-10 16:50:29)  

## 2019-02-25 NOTE — Addendum Note (Signed)
Addended by: [name] F on: 02/25/2019 12:06 PM   Modules accepted: Orders, SmartSet

## 2019-02-26 ENCOUNTER — Inpatient Hospital Stay (HOSPITAL_BASED_OUTPATIENT_CLINIC_OR_DEPARTMENT_OTHER): Payer: 59 | Admitting: Oncology

## 2019-02-26 ENCOUNTER — Other Ambulatory Visit: Payer: Self-pay

## 2019-02-26 ENCOUNTER — Inpatient Hospital Stay: Payer: 59

## 2019-02-26 ENCOUNTER — Telehealth: Payer: Self-pay | Admitting: Surgery

## 2019-02-26 DIAGNOSIS — C187 Malignant neoplasm of sigmoid colon: Secondary | ICD-10-CM | POA: Diagnosis not present

## 2019-02-26 DIAGNOSIS — Z5111 Encounter for antineoplastic chemotherapy: Secondary | ICD-10-CM | POA: Diagnosis not present

## 2019-02-26 NOTE — Telephone Encounter (Signed)
I have called patient to go over surgery information. No answer. I have left a message on patient's voicemail.    pre admission date/time, Covid Testing date and Surgery date.  Surgery Date: 03/06/19 with Dr Jeremy Johann placement.  Preadmission Testing Date: 02/28/19 between 8-1:00pm-phone interview.  Covid Testing Date: 03/03/19 between 8-10:30am - patient advised to go to the [facility]  [employer] Video sent via [employer] [facility] and [employer].  Patient has been made aware to call [phone], between 1-3:00pm the day before surgery, to find out what time to arrive.

## 2019-02-27 NOTE — Telephone Encounter (Signed)
Patient has called back and all surgery information has been explained.

## 2019-02-28 ENCOUNTER — Inpatient Hospital Stay: Admission: RE | Admit: 2019-02-28 | Payer: 59 | Source: Ambulatory Visit

## 2019-02-28 NOTE — Progress Notes (Signed)
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[place])   02/21/2019 Cancer Staging   Staging form: Colon and Rectum, AJCC 8th Edition - Pathologic stage from 02/21/2019: Stage IIIB (pT3, pN1a, cM0) - Signed by Sindy Guadeloupe, MD on 02/24/2019   03/17/2019 -  Chemotherapy   The patient had palonosetron (ALOXI) injection 0.25 mg, 0.25 mg, Intravenous,  Once, 0 of 4 cycles leucovorin 864 mg in dextrose 5 % 250 mL infusion, 400 mg/m2, Intravenous,  Once, 0 of 4 cycles oxaliplatin (ELOXATIN) 185 mg in dextrose 5 % 500 mL chemo infusion, 85 mg/m2, Intravenous,  Once, 0 of 4 cycles fluorouracil (ADRUCIL) chemo injection 850 mg, 400 mg/m2, Intravenous,  Once, 0 of 4 cycles fluorouracil (ADRUCIL) 5,200 mg in sodium chloride 0.9 % 146 mL chemo infusion, 2,400 mg/m2, Intravenous, 1 Day/Dose, 0 of 4 cycles  for chemotherapy treatment.      Interval history-Mr. Keith Trujillo is a 58 year old male who presents to chemo care clinic today for initial meeting in preparation for starting chemotherapy. I introduced the chemo care clinic and we discussed that the role of the clinic is to assist those who are at an increased risk of emergency room visits and/or complications during the course of chemotherapy treatment. We discussed that the increased risk takes into account factors such as age, performance status, and co-morbidities. We also discussed that for  some, this might include barriers to care such as not having a primary care provider, lack of insurance/transportation, or not being able to afford medications. We discussed that the goal of the program is to help prevent unplanned ER visits and help reduce complications during chemotherapy. We do this by discussing specific risk factors to each individual and identifying ways that we can help improve these risk factors and reduce barriers to care.   ECOG FS:1 - Symptomatic but completely ambulatory  Review of systems- Review of Systems  Constitutional: Positive for malaise/fatigue. Negative for chills, fever and weight loss.  HENT: Negative for congestion, ear pain and tinnitus.   Eyes: Negative.  Negative for blurred vision and double vision.  Respiratory: Negative.  Negative for cough, sputum production and shortness of breath.   Cardiovascular: Negative.  Negative for chest pain, palpitations and leg swelling.  Gastrointestinal: Negative.  Negative for abdominal pain, constipation, diarrhea, nausea and vomiting.  Genitourinary: Negative for dysuria, frequency and urgency.  Musculoskeletal: Negative for back pain and falls.  Skin: Negative.  Negative for rash.  Neurological: Negative.  Negative for weakness and headaches.  Endo/Heme/Allergies: Negative.  Does not bruise/bleed easily.  Psychiatric/Behavioral: Negative.  Negative for depression. The patient is not nervous/anxious and does not have insomnia.      Current treatment-we will begin adjuvant chemotherapy with FOLFOX every 2 weeks for 12 cycles.  He will return on day 3 for pump disconnect.  Allergies  Allergen Reactions  . Sulfa Antibiotics Rash    Past Medical History:  Diagnosis Date  . Abdominal pain   . Allergy   .  Cancer ([place])   . Cellulitis   . Dyspnea   . Liver spot   . Loose stools   . Weight loss     Past Surgical History:  Procedure Laterality Date  . COLONOSCOPY WITH PROPOFOL N/A 01/02/2019   Procedure:  COLONOSCOPY WITH BIOPSY;  Surgeon: [name], MD;  Location: [place];  Service: Endoscopy;  Laterality: N/A;  Clip x2 at Biopsy Site  . COLOSTOMY Right 02/06/2019   Procedure: COLOSTOMY;  Surgeon: [name], MD;  Location: [facility];  Service: General;  Laterality: Right;  . excision of skin lesion    . LAPAROSCOPIC SIGMOID COLECTOMY N/A 02/06/2019   Procedure: LAPAROSCOPIC SIGMOID COLECTOMY converted to open procedure;  Surgeon: [name], MD;  Location: [facility];  Service: General;  Laterality: N/A;  . TONSILLECTOMY AND ADENOIDECTOMY  1969    Social History   Socioeconomic History  . Marital status: Married    Spouse name: Not on file  . Number of children: Not on file  . Years of education: Not on file  . Highest education level: Not on file  Occupational History  . Not on file  Tobacco Use  . Smoking status: Light Tobacco Smoker    Types: Cigars  . Smokeless tobacco: Former Systems developer    Quit date: 1980  Substance and Sexual Activity  . Alcohol use: Yes    Comment: rare  . Drug use: Never  . Sexual activity: Yes  Other Topics Concern  . Not on file  Social History Narrative  . Not on file   Social Determinants of Health   Financial Resource Strain:   . Difficulty of Paying Living Expenses: Not on file  Food Insecurity:   . Worried About Charity fundraiser in the Last Year: Not on file  . Ran Out of Food in the Last Year: Not on file  Transportation Needs:   . Lack of Transportation (Medical): Not on file  . Lack of Transportation (Non-Medical): Not on file  Physical Activity:   . Days of Exercise per Week: Not on file  . Minutes of Exercise per Session: Not on file  Stress:   . Feeling of Stress : Not on file  Social Connections:   . Frequency of Communication with Friends and Family: Not on file  . Frequency of Social Gatherings with Friends and Family: Not on file  . Attends Religious Services: Not on file  . Active Member of Clubs or  Organizations: Not on file  . Attends Archivist Meetings: Not on file  . Marital Status: Not on file  Intimate Partner Violence:   . Fear of Current or Ex-Partner: Not on file  . Emotionally Abused: Not on file  . Physically Abused: Not on file  . Sexually Abused: Not on file    Family History  Problem Relation Age of Onset  . Lung cancer Father      Current Outpatient Medications:  .  dexamethasone (DECADRON) 4 MG tablet, Take 2 tablets (8 mg total) by mouth daily. Start the day after chemotherapy for 2 days. Take with food., Disp: 30 tablet, Rfl: 1 .  lidocaine-prilocaine (EMLA) cream, Apply to affected area once (Patient taking differently: Apply 1 application topically daily as needed (port). Apply to affected area once), Disp: 30 g, Rfl: 3 .  Loperamide HCl (IMODIUM PO), Take 2 mg by mouth 2 (two) times daily as needed (diarrhea or loose stools). , Disp: , Rfl:  .  ondansetron (  ZOFRAN) 8 MG tablet, Take 1 tablet (8 mg total) by mouth 2 (two) times daily as needed for refractory nausea / vomiting. Start on day 3 after chemotherapy., Disp: 30 tablet, Rfl: 1 .  prochlorperazine (COMPAZINE) 10 MG tablet, Take 1 tablet (10 mg total) by mouth every 6 (six) hours as needed (Nausea or vomiting)., Disp: 30 tablet, Rfl: 1  Physical exam: There were no vitals filed for this visit. Physical Exam Constitutional:      Appearance: Normal appearance.  HENT:     Head: Normocephalic and atraumatic.  Eyes:     Pupils: Pupils are equal, round, and reactive to light.  Cardiovascular:     Rate and Rhythm: Normal rate and regular rhythm.     Heart sounds: Normal heart sounds. No murmur.  Pulmonary:     Effort: Pulmonary effort is normal.     Breath sounds: Normal breath sounds. No wheezing.  Abdominal:     General: Bowel sounds are normal. There is no distension.     Palpations: Abdomen is soft.     Tenderness: There is no abdominal tenderness.  Musculoskeletal:        General:  Normal range of motion.     Cervical back: Normal range of motion.  Skin:    General: Skin is warm and dry.     Findings: No rash.  Neurological:     Mental Status: He is alert and oriented to person, place, and time.  Psychiatric:        Judgment: Judgment normal.      CMP Latest Ref Rng & Units 02/21/2019  Glucose 70 - 99 mg/dL 109(H)  BUN 6 - 20 mg/dL 16  Creatinine 0.61 - 1.24 mg/dL 0.77  Sodium 135 - 145 mmol/L 137  Potassium 3.5 - 5.1 mmol/L 4.4  Chloride 98 - 111 mmol/L 101  CO2 22 - 32 mmol/L 28  Calcium 8.9 - 10.3 mg/dL 9.2  Total Protein 6.5 - 8.1 g/dL 6.4(L)  Total Bilirubin 0.3 - 1.2 mg/dL 0.5  Alkaline Phos 38 - 126 U/L 72  AST 15 - 41 U/L 19  ALT 0 - 44 U/L 26   CBC Latest Ref Rng & Units 02/21/2019  WBC 4.0 - 10.5 K/uL 6.1  Hemoglobin 13.0 - 17.0 g/dL 10.5(L)  Hematocrit 39.0 - 52.0 % 33.3(L)  Platelets 150 - 400 K/uL 216    No images are attached to the encounter.  CT Chest W Contrast  Result Date: 02/18/2019 CLINICAL DATA:  Staging workup for colon cancer. EXAM: CT CHEST WITH CONTRAST TECHNIQUE: Multidetector CT imaging of the chest was performed during intravenous contrast administration. CONTRAST:  52m OMNIPAQUE IOHEXOL 300 MG/ML  SOLN COMPARISON:  None. FINDINGS: Cardiovascular: The heart size appears within normal limits. No pericardial effusion identified. Mediastinum/Nodes: No enlarged mediastinal, hilar, or axillary lymph nodes. Thyroid gland, trachea, and esophagus demonstrate no significant findings. Lungs/Pleura: No pleural effusion identified. No airspace consolidation, atelectasis or pneumothorax. Calcified granuloma identified within the right midlung, image 69/100. Noncalcified left lower lobe lung nodule measures 3 mm, image 122/3. 3 mm lingular nodule is also noted, image 113/3. Calcified nodule in the lingula. Upper Abdomen: Pneumoperitoneum identified, likely postoperative. Musculoskeletal: No chest wall abnormality. No acute or significant  osseous findings. IMPRESSION: 1. No specific findings identified to suggest metastatic disease to the chest. 2. Two small nodules are identified within the lingula and left lower lobe, nonspecific. These measure up to 3 mm. Attention on follow-up imaging advised. 3. Prior granulomatous disease. 4.  Pneumoperitoneum, likely postoperative. Electronically Signed   By: Kerby Moors M.D.   On: 02/18/2019 14:08   MR Abdomen W Wo Contrast  Result Date: 02/19/2019 CLINICAL DATA:  Colon cancer with liver lesions seen on previous CT. EXAM: MRI ABDOMEN WITHOUT AND WITH CONTRAST TECHNIQUE: Multiplanar multisequence MR imaging of the abdomen was performed both before and after the administration of intravenous contrast. CONTRAST:  23m GADAVIST GADOBUTROL 1 MMOL/ML IV SOLN COMPARISON:  CT scan 01/21/2019 FINDINGS: Lower chest: Unremarkable. Hepatobiliary: Small focus of differential signal again identified in the medial segment left liver, adjacent to the falciform ligament, corresponding to the lesion identified on previous CT scan. This lesion loses signal intensity on out of phase T1 imaging compared to the in phase sequence, compatible with the presence of intralesional lipid. No evidence for enhancement after IV contrast administration. Scattered very tiny foci of signal void are identified along the dome of liver, between the liver in the hemidiaphragm. These are compatible with tiny gas bubbles as seen on previous CT of 02/18/2019. There is also some intraperitoneal free air identified anterior to the liver. Patient underwent right sigmoid colectomy on 02/06/2019 and while free gas is not unexpected 7-10 days after surgery, residual free gas at 13 days postop is somewhat atypical. There is no evidence for gallstones, gallbladder wall thickening, or pericholecystic fluid. No intrahepatic or extrahepatic biliary dilation. Pancreas: No focal mass lesion. No dilatation of the main duct. No intraparenchymal cyst. No  peripancreatic edema. Spleen:  No splenomegaly. No focal mass lesion. Adrenals/Urinary Tract: No adrenal nodule or mass. Kidneys unremarkable. Stomach/Bowel: Stomach is unremarkable. No gastric wall thickening. No evidence of outlet obstruction. Duodenum is normally positioned as is the ligament of Treitz. No small bowel or colonic dilatation within the visualized abdomen. Vascular/Lymphatic: No abdominal aortic aneurysm. No abdominal lymphadenopathy. Other: There is some edema and trace fluid noted in the left upper quadrant around the spleen. Musculoskeletal: No abnormal marrow enhancement within the visualized bony anatomy. IMPRESSION: 1. Lesion of concern in the medial segment left liver on previous CT of 01/21/2019 represents focal fatty deposition. No evidence for metastatic disease within the liver parenchyma. 2. Residual intraperitoneal free air in this patient 13 days out from surgery. While intraperitoneal free gas is not unexpected 7-10 days from surgery, residual gas on postoperative day 13 is considered atypical. Electronically Signed   By: EMisty StanleyM.D.   On: 02/19/2019 11:46     Assessment and plan- Patient is a 58y.o. [facility] for starting chemotherapy for the treatment of stage IIIb colon cancer.   1. HPI: He is a 58year old male who was seen by Dr. WLeonides Schanzfrom GI for symptoms of diarrhea as well as abdominal pain.  Underwent a colonoscopy on 01/02/2019 which showed an ulcerated partially obstructing large mass in the sigmoid colon which measured 10 cm in length.  Biopsy showed adenocarcinoma moderately differentiated.  Baseline CEA elevated at 71.7.  He was seen by Dr. PPerrin Malteseand had laparoscopic chemo colectomy with lymph node sampling.  Final pathology showed 8 cm invasive colorectal adenocarcinoma grade 2.  Tumor invades through the muscular propria into periclitoral tissue.  All margins are negative.  CT abdomen  pelvis with contrast showed irregular marked circumferential wall thickening of the sigmoid colon near the rectosigmoid junction.  Plan is for 12 cycles of FOLFOX chemotherapy every 2 weeks with pump disconnect on day 3.  2. Chemo Care Clinic/High Risk for  ER/Hospitalization during chemotherapy- We discussed the role of the chemo care clinic and identified patient specific risk factors. I discussed that patient was identified as high risk primarily based on recent ED utilization and comorbidities.  Patient has past medical history positive for: Past Medical History:  Diagnosis Date  . Abdominal pain   . Allergy   . Cancer ([place])   . Cellulitis   . Dyspnea   . Liver spot   . Loose stools   . Weight loss     Patient has past surgical history positive for: Past Surgical History:  Procedure Laterality Date  . COLONOSCOPY WITH PROPOFOL N/A 01/02/2019   Procedure: COLONOSCOPY WITH BIOPSY;  Surgeon: [name], MD;  Location: [place];  Service: Endoscopy;  Laterality: N/A;  Clip x2 at Biopsy Site  . COLOSTOMY Right 02/06/2019   Procedure: COLOSTOMY;  Surgeon: [name], MD;  Location: [facility];  Service: General;  Laterality: Right;  . excision of skin lesion    . LAPAROSCOPIC SIGMOID COLECTOMY N/A 02/06/2019   Procedure: LAPAROSCOPIC SIGMOID COLECTOMY converted to open procedure;  Surgeon: [name], MD;  Location: [facility];  Service: General;  Laterality: N/A;  . [place]    Based on our high risk symptom management report; this patient has a high risk of ED utilization.  The percentage below indicates how "at risk "  this patient based on the factors in this table within one year.   Risk of Admission or ED Visit  This score indicates an adult patient's 1-year risk, as a percentage, of a hospital admission or ED visit.    0%  2%   3 months ago 8 weeks ago 4 weeks ago Today   1% 11/28/18 1235   2% 01/02/19 0907   2% 02/28/19 1240      Factor Value  Current PCP Kendell Bane, NP  Hospital Admissions 3   ED Visits 0  Has Medicaid No  Has Medicare No  In relationship Yes  Has Anemia No  Has asthma No  Has atrial fibrillation No  Has CVD No  Has chronic kidney disease No  Has Chronic Obstructive Pulmonary Disease No  Has Congestive Heart Failure No  Has Connective Tissue Disorder No  Has Depression No  Has Diabetes No  Has liver disease No  Has Peripheral Vascular Disease No   3. We discussed that social determinants of health may have significant impacts on health and outcomes for cancer patients.  Today we discussed specific social determinants of performance status, alcohol use, depression, financial needs, food insecurity, housing, interpersonal violence, social connections, stress, tobacco use, and transportation.    After lengthy discussion he does not identify any areas of need at this time.  We discussed options including home based and outpatient services, DME, and CARE program. We discusssed that patients who participate in regular physical activity report fewer negative impacts of cancer and treatments and report less fatigue.  We discussed self-referral to [facility] for counseling services, psychiatry for medication management, or palliative care/symptom management as well as primary care providers.  We discussed that living with cancer can create tremendous financial burden.  We discussed options for assistance. I asked that if assistance is needed in affording medications or paying bills to please let us know so that we can provide assistance.  We discussed options for food including social services.  We will also notify [facility] to see if cancer center can provide support.  Patient informed of food pantry at cancer center and was provided with care package today.  Please notify nursing if un-met needs. We discussed referral to social work. Will also discuss with Elease Etienne to see if [place]  can provide support of utility bills, etc.  We discussed options for support groups at the cancer center. If interested, please notify nurse navigator to enroll.  We discussed options for managing stress including healthy eating, exercise as well as participating in no charge counseling services at the cancer center and support groups.  If these are of interest, patient can notify either myself or primary nursing team. We discussed options for management including medications and referral to quit Smart program We discussed options for transportation including acta, paratransit, bus routes, link transit, taxi/uber/lyft, and cancer center van.  I have notified primary oncology team who will help assist with arranging Lucianne Lei transportation for appointments when needed. We also discussed options for transportation on short notice/acute visits.   We also discussed the role of the Symptom Management Clinic at [facility] for acute issues and methods of contacting clinic/provider. He denies needing specific assistance at this time and He will be followed by Mariea Clonts, RN (Nurse Navigator).   Visit Diagnosis 1. [facility])     Patient expressed understanding and was in agreement with this plan. He also understands that He can call clinic at any time with any questions, concerns, or complaints.   A total of (25) minutes of face-to-face time was spent with this patient with greater than 50% of that time in counseling and care-coordination.  [facility] [place]  CC: Dr. Janese Banks

## 2019-03-03 ENCOUNTER — Other Ambulatory Visit
Admission: RE | Admit: 2019-03-03 | Discharge: 2019-03-03 | Disposition: A | Discharge status: Home / Self Care | Payer: 59 | Source: Ambulatory Visit | Attending: Surgery | Admitting: Surgery

## 2019-03-03 ENCOUNTER — Other Ambulatory Visit: Payer: Self-pay

## 2019-03-03 ENCOUNTER — Encounter
Admission: RE | Admit: 2019-03-03 | Discharge: 2019-03-03 | Disposition: A | Discharge status: Home / Self Care | Payer: 59 | Source: Ambulatory Visit | Attending: Surgery | Admitting: Surgery

## 2019-03-03 DIAGNOSIS — Z01812 Encounter for preprocedural laboratory examination: Secondary | ICD-10-CM | POA: Diagnosis not present

## 2019-03-03 DIAGNOSIS — Z20828 Contact with and (suspected) exposure to other viral communicable diseases: Secondary | ICD-10-CM | POA: Diagnosis not present

## 2019-03-03 LAB — SARS CORONAVIRUS 2 (TAT 6-24 HRS): SARS Coronavirus 2: NEGATIVE

## 2019-03-04 ENCOUNTER — Ambulatory Visit (INDEPENDENT_AMBULATORY_CARE_PROVIDER_SITE_OTHER): Payer: 59 | Admitting: Physician Assistant

## 2019-03-04 ENCOUNTER — Encounter: Payer: Self-pay | Admitting: Physician Assistant

## 2019-03-04 VITALS — BP 127/76 | HR 63 | Temp 97.7°F | Ht 76.0 in | Wt 188.0 lb

## 2019-03-04 DIAGNOSIS — Z09 Encounter for follow-up examination after completed treatment for conditions other than malignant neoplasm: Secondary | ICD-10-CM

## 2019-03-04 DIAGNOSIS — C187 Malignant neoplasm of sigmoid colon: Secondary | ICD-10-CM

## 2019-03-04 NOTE — Progress Notes (Signed)
[facility] POST-OP OFFICE VISIT  03/04/2019  HPI: Keith Trujillo is a 58 y.o. male ~1 month s/p open LAR with diverting loop ileostomy for sigmoid colon CA with Dr Dahlia Byes. He has done well post-operatively. He was seen and evaluated by oncology (Dr Janese Banks, MD) with plans for adjuvant chemotherapy (FOLFOX x12 cycles) starting 12/28.  From his surgery standpoint, no issues. Ileostomy working. No N/V or fever. No other complaints.   He also presents for discussion of port placement.   Vital signs: BP 127/76   Pulse 63   Temp 97.7 F (36.5 C)   Ht 6\' 4"  (1.93 m)   Wt 188 lb (85.3 kg)   SpO2 98%   BMI 22.88 kg/m    Physical Exam: Constitutional: Well appearing male, NAD Abdomen: Soft, non-tender, non-distended, no rebound/guarding. Ileostomy in RLQ patent, mildly retracted, ileostomy stool and gas in bag.  Skin: Laparotomy incision is well healed, no erythema or drainage  Assessment/Plan: This is a 58 y.o. male ~1 month s/p open LAR with diverting loop ileostomy for sigmoid colon CA who presents for discussion of port placement for adjuvant chemotherapy.    - No issues from surgical standpoint   - Will proceed with port-a-catheter placement on 12/17 with Dr Dahlia Byes  - All risks, benefits, and alternatives to above procedure(s) were discussed with the patient, all of his questions were answered to his expressed satisfaction, patient expresses he wishes to proceed, and informed consent was obtained.  -- Edison Simon, PA-C [facility] 03/04/2019, 10:26 AM [phone] M-F: 7am - 4pm

## 2019-03-04 NOTE — Patient Instructions (Signed)

## 2019-03-06 ENCOUNTER — Encounter
Admission: RE | Disposition: A | Discharge status: Home / Self Care | Payer: Self-pay | Source: Home / Self Care | Attending: Surgery

## 2019-03-06 ENCOUNTER — Ambulatory Visit
Admission: RE | Admit: 2019-03-06 | Discharge: 2019-03-06 | Disposition: A | Discharge status: Home / Self Care | Payer: No Typology Code available for payment source | Source: Home / Self Care | Attending: Surgery | Admitting: Surgery

## 2019-03-06 ENCOUNTER — Ambulatory Visit
Admission: RE | Admit: 2019-03-06 | Discharge: 2019-03-06 | Disposition: A | Discharge status: Home / Self Care | Payer: No Typology Code available for payment source | Attending: Surgery | Admitting: Surgery

## 2019-03-06 ENCOUNTER — Ambulatory Visit: Payer: No Typology Code available for payment source | Admitting: Certified Registered"

## 2019-03-06 ENCOUNTER — Ambulatory Visit: Payer: No Typology Code available for payment source

## 2019-03-06 ENCOUNTER — Encounter: Payer: Self-pay | Admitting: Surgery

## 2019-03-06 ENCOUNTER — Other Ambulatory Visit: Payer: Self-pay

## 2019-03-06 DIAGNOSIS — Z882 Allergy status to sulfonamides status: Secondary | ICD-10-CM | POA: Diagnosis not present

## 2019-03-06 DIAGNOSIS — F17290 Nicotine dependence, other tobacco product, uncomplicated: Secondary | ICD-10-CM | POA: Diagnosis not present

## 2019-03-06 DIAGNOSIS — C187 Malignant neoplasm of sigmoid colon: Secondary | ICD-10-CM | POA: Diagnosis not present

## 2019-03-06 HISTORY — PX: PORTACATH PLACEMENT: SHX2246

## 2019-03-06 SURGERY — INSERTION, TUNNELED CENTRAL VENOUS DEVICE, WITH PORT
Anesthesia: General | Site: Chest | Laterality: Right

## 2019-03-06 MED ORDER — SODIUM CHLORIDE 0.9 % IV SOLN
INTRAVENOUS | Status: DC | PRN
  Administered 2019-03-06: 100 mL

## 2019-03-06 MED ORDER — DEXAMETHASONE SODIUM PHOSPHATE 10 MG/ML IJ SOLN
INTRAMUSCULAR | Status: AC
  Filled 2019-03-06: qty 1

## 2019-03-06 MED ORDER — HEPARIN SODIUM (PORCINE) 5000 UNIT/ML IJ SOLN
INTRAMUSCULAR | Status: AC
  Filled 2019-03-06: qty 1

## 2019-03-06 MED ORDER — ROCURONIUM BROMIDE 50 MG/5ML IV SOLN
INTRAVENOUS | Status: AC
  Filled 2019-03-06: qty 1

## 2019-03-06 MED ORDER — LACTATED RINGERS IV SOLN
INTRAVENOUS | Status: DC
  Administered 2019-03-06: 50 mL/h via INTRAVENOUS

## 2019-03-06 MED ORDER — CHLORHEXIDINE GLUCONATE CLOTH 2 % EX PADS
6.0000 | MEDICATED_PAD | Freq: Once | CUTANEOUS | Status: AC
  Administered 2019-03-06: 6 via TOPICAL

## 2019-03-06 MED ORDER — FENTANYL CITRATE (PF) 100 MCG/2ML IJ SOLN
INTRAMUSCULAR | Status: DC | PRN
  Administered 2019-03-06: 25 ug via INTRAVENOUS
  Administered 2019-03-06: 50 ug via INTRAVENOUS
  Administered 2019-03-06: 25 ug via INTRAVENOUS

## 2019-03-06 MED ORDER — CHLORHEXIDINE GLUCONATE CLOTH 2 % EX PADS: 6.0000 | MEDICATED_PAD | Freq: Once | CUTANEOUS | Status: DC

## 2019-03-06 MED ORDER — FAMOTIDINE 20 MG PO TABS
ORAL_TABLET | ORAL | Status: AC
  Administered 2019-03-06: 20 mg
  Filled 2019-03-06: qty 1

## 2019-03-06 MED ORDER — LIDOCAINE HCL (PF) 1 % IJ SOLN
INTRAMUSCULAR | Status: AC
  Filled 2019-03-06: qty 30

## 2019-03-06 MED ORDER — SUGAMMADEX SODIUM 200 MG/2ML IV SOLN
INTRAVENOUS | Status: AC
  Filled 2019-03-06: qty 2

## 2019-03-06 MED ORDER — BUPIVACAINE HCL (PF) 0.25 % IJ SOLN
INTRAMUSCULAR | Status: AC
  Filled 2019-03-06: qty 30

## 2019-03-06 MED ORDER — LIDOCAINE HCL (PF) 1 % IJ SOLN
INTRAMUSCULAR | Status: DC | PRN
  Administered 2019-03-06: 20 mL via SUBCUTANEOUS

## 2019-03-06 MED ORDER — PROPOFOL 10 MG/ML IV BOLUS
INTRAVENOUS | Status: DC | PRN
  Administered 2019-03-06: 75 ug/kg/min via INTRAVENOUS
  Administered 2019-03-06: 40 mg via INTRAVENOUS
  Administered 2019-03-06: 50 mg via INTRAVENOUS

## 2019-03-06 MED ORDER — LIDOCAINE HCL (PF) 2 % IJ SOLN
INTRAMUSCULAR | Status: AC
  Filled 2019-03-06: qty 10

## 2019-03-06 MED ORDER — CEFAZOLIN SODIUM-DEXTROSE 2-4 GM/100ML-% IV SOLN
INTRAVENOUS | Status: AC
  Filled 2019-03-06: qty 100

## 2019-03-06 MED ORDER — ONDANSETRON HCL 4 MG/2ML IJ SOLN
INTRAMUSCULAR | Status: DC | PRN
  Administered 2019-03-06: 4 mg via INTRAVENOUS

## 2019-03-06 MED ORDER — SODIUM CHLORIDE FLUSH 0.9 % IV SOLN
INTRAVENOUS | Status: AC
  Filled 2019-03-06: qty 10

## 2019-03-06 MED ORDER — ONDANSETRON HCL 4 MG/2ML IJ SOLN
INTRAMUSCULAR | Status: AC
  Filled 2019-03-06: qty 2

## 2019-03-06 MED ORDER — CEFAZOLIN SODIUM-DEXTROSE 2-4 GM/100ML-% IV SOLN
2.0000 g | INTRAVENOUS | Status: AC
  Administered 2019-03-06: 2 g via INTRAVENOUS

## 2019-03-06 MED ORDER — PROPOFOL 500 MG/50ML IV EMUL
INTRAVENOUS | Status: AC
  Filled 2019-03-06: qty 50

## 2019-03-06 MED ORDER — FENTANYL CITRATE (PF) 100 MCG/2ML IJ SOLN: 25.0000 ug | INTRAMUSCULAR | Status: DC | PRN

## 2019-03-06 MED ORDER — PROPOFOL 10 MG/ML IV BOLUS
INTRAVENOUS | Status: AC
  Filled 2019-03-06: qty 20

## 2019-03-06 MED ORDER — MIDAZOLAM HCL 2 MG/2ML IJ SOLN
INTRAMUSCULAR | Status: AC
  Filled 2019-03-06: qty 2

## 2019-03-06 MED ORDER — ACETAMINOPHEN 10 MG/ML IV SOLN
INTRAVENOUS | Status: AC
  Filled 2019-03-06: qty 100

## 2019-03-06 MED ORDER — MIDAZOLAM HCL 2 MG/2ML IJ SOLN
INTRAMUSCULAR | Status: DC | PRN
  Administered 2019-03-06: 2 mg via INTRAVENOUS

## 2019-03-06 MED ORDER — LIDOCAINE HCL (CARDIAC) PF 100 MG/5ML IV SOSY
PREFILLED_SYRINGE | INTRAVENOUS | Status: DC | PRN
  Administered 2019-03-06: 100 mg via INTRATRACHEAL

## 2019-03-06 MED ORDER — FAMOTIDINE 20 MG PO TABS: 20.0000 mg | ORAL_TABLET | Freq: Once | ORAL | Status: DC

## 2019-03-06 MED ORDER — EPINEPHRINE PF 1 MG/ML IJ SOLN
INTRAMUSCULAR | Status: AC
  Filled 2019-03-06: qty 1

## 2019-03-06 MED ORDER — BUPIVACAINE-EPINEPHRINE (PF) 0.25% -1:200000 IJ SOLN
INTRAMUSCULAR | Status: DC | PRN
  Administered 2019-03-06: 20 mL

## 2019-03-06 MED ORDER — FENTANYL CITRATE (PF) 100 MCG/2ML IJ SOLN
INTRAMUSCULAR | Status: AC
  Filled 2019-03-06: qty 2

## 2019-03-06 MED ORDER — SODIUM CHLORIDE 0.9 % IV SOLN
INTRAVENOUS | Status: DC | PRN
  Administered 2019-03-06: 5 mL via INTRAMUSCULAR

## 2019-03-06 SURGICAL SUPPLY — 36 items
BAG DECANTER FOR FLEXI CONT (MISCELLANEOUS) ×3 IMPLANT
BLADE SURG SZ11 CARB STEEL (BLADE) ×3 IMPLANT
BOOT SUTURE AID YELLOW STND (SUTURE) ×3 IMPLANT
CANISTER SUCT 1200ML W/VALVE (MISCELLANEOUS) ×3 IMPLANT
CHLORAPREP W/TINT 26 (MISCELLANEOUS) ×3 IMPLANT
COVER LIGHT HANDLE STERIS (MISCELLANEOUS) ×6 IMPLANT
COVER WAND RF STERILE (DRAPES) ×3 IMPLANT
DERMABOND ADVANCED (GAUZE/BANDAGES/DRESSINGS) ×2
DERMABOND ADVANCED .7 DNX12 (GAUZE/BANDAGES/DRESSINGS) ×1 IMPLANT
DRAPE 3/4 80X56 (DRAPES) ×3 IMPLANT
DRAPE C-ARM XRAY 36X54 (DRAPES) ×6 IMPLANT
DRAPE INCISE IOBAN 66X45 STRL (DRAPES) ×3 IMPLANT
ELECT CAUTERY BLADE 6.4 (BLADE) ×3 IMPLANT
ELECT REM PT RETURN 9FT ADLT (ELECTROSURGICAL) ×3
ELECTRODE REM PT RTRN 9FT ADLT (ELECTROSURGICAL) ×1 IMPLANT
GEL ULTRASOUND 20GR AQUASONIC (MISCELLANEOUS) ×3 IMPLANT
GLOVE BIO SURGEON STRL SZ7 (GLOVE) ×3 IMPLANT
GOWN STRL REUS W/ TWL LRG LVL3 (GOWN DISPOSABLE) ×2 IMPLANT
GOWN STRL REUS W/TWL LRG LVL3 (GOWN DISPOSABLE) ×4
IV NS 500ML (IV SOLUTION) ×2
IV NS 500ML BAXH (IV SOLUTION) ×1 IMPLANT
KIT PORT POWER 8FR ISP CVUE (Port) ×3 IMPLANT
NEEDLE HYPO 22GX1.5 SAFETY (NEEDLE) ×3 IMPLANT
NS IRRIG 1000ML POUR BTL (IV SOLUTION) ×3 IMPLANT
PACK PORT-A-CATH (MISCELLANEOUS) ×3 IMPLANT
SPONGE LAP 18X18 RF (DISPOSABLE) ×3 IMPLANT
SUT MNCRL AB 4-0 PS2 18 (SUTURE) ×3 IMPLANT
SUT PROLENE 2-0 (SUTURE) ×2
SUT PROLENE 2-0 RB1 36X2 ARM (SUTURE) ×1
SUT VIC AB 3-0 SH 27 (SUTURE) ×2
SUT VIC AB 3-0 SH 27X BRD (SUTURE) ×1 IMPLANT
SUTURE PROLEN 2-0 RB1 36X2 ARM (SUTURE) ×1 IMPLANT
SYR 10ML LL (SYRINGE) ×3 IMPLANT
SYR 20ML LL LF (SYRINGE) ×3 IMPLANT
SYR 5ML LL (SYRINGE) ×3 IMPLANT
TOWEL OR 17X26 4PK STRL BLUE (TOWEL DISPOSABLE) ×3 IMPLANT

## 2019-03-06 NOTE — Anesthesia Post-op Follow-up Note (Signed)
Anesthesia QCDR form completed.        

## 2019-03-06 NOTE — Transfer of Care (Signed)
Immediate Anesthesia Transfer of Care Note  Patient: Keith Trujillo  Procedure(s) Performed: INSERTION PORT-A-CATH (N/A )  Patient Location: PACU  Anesthesia Type:General  Level of Consciousness: awake, alert  and oriented  Airway & Oxygen Therapy: Patient Spontanous Breathing  Post-op Assessment: Report given to RN and Post -op Vital signs reviewed and stable  Post vital signs: Reviewed and stable  Last Vitals:  Vitals Value Taken Time  BP    Temp    Pulse    Resp    SpO2      Last Pain:  Vitals:   03/06/19 1104  TempSrc: Tympanic  PainSc: 0-No pain         Complications: No apparent anesthesia complications

## 2019-03-06 NOTE — Anesthesia Preprocedure Evaluation (Signed)
Anesthesia Evaluation  Patient identified by MRN, date of birth, ID band Patient awake    Reviewed: Allergy & Precautions, H&P , NPO status , Patient's Chart, lab work & pertinent test results  Airway Mallampati: II  TM Distance: >3 FB Neck ROM: full    Dental  (+) Chipped   Pulmonary neg pulmonary ROS, neg shortness of breath, neg COPD, neg recent URI, Current Smoker and Patient abstained from smoking.,           Cardiovascular (-) angina(-) Past MI negative cardio ROS  (-) dysrhythmias      Neuro/Psych negative neurological ROS  negative psych ROS   GI/Hepatic Neg liver ROS, Rectal cancer   Endo/Other  negative endocrine ROS  Renal/GU negative Renal ROS  negative genitourinary   Musculoskeletal   Abdominal   Peds  Hematology negative hematology ROS (+)   Anesthesia Other Findings Past Medical History: No date: Abdominal pain No date: Allergy No date: Cancer [facility]) No date: Cellulitis No date: Dyspnea No date: Liver spot No date: Loose stools No date: Weight loss  Past Surgical History: 01/02/2019: COLONOSCOPY WITH PROPOFOL; N/A     Comment:  Procedure: COLONOSCOPY WITH BIOPSY;  Surgeon: [name], MD;  Location: [place];  Service:               Endoscopy;  Laterality: N/A;  Clip x2 at Biopsy Site 02/06/2019: COLOSTOMY; Right     Comment:  Procedure: COLOSTOMY;  Surgeon: [name], MD;                Location: [facility];  Service: General;  Laterality:               Right; No date: excision of skin lesion 02/06/2019: LAPAROSCOPIC SIGMOID COLECTOMY; N/A     Comment:  Procedure: LAPAROSCOPIC SIGMOID COLECTOMY converted to               open procedure;  Surgeon: [name], MD;  Location:               [facility];  Service: General;  Laterality: N/A; 1969: TONSILLECTOMY AND ADENOIDECTOMY  BMI    Body Mass Index: 23.24 kg/m      Reproductive/Obstetrics negative OB  ROS                             Anesthesia Physical Anesthesia Plan  ASA: II  Anesthesia Plan: General   Post-op Pain Management:    Induction:   PONV Risk Score and Plan: Propofol infusion and TIVA  Airway Management Planned:   Additional Equipment:   Intra-op Plan:   Post-operative Plan:   Informed Consent: I have reviewed the patients History and Physical, chart, labs and discussed the procedure including the risks, benefits and alternatives for the proposed anesthesia with the patient or authorized representative who has indicated his/her understanding and acceptance.     Dental Advisory Given  Plan Discussed with: Anesthesiologist  Anesthesia Plan Comments:         Anesthesia Quick Evaluation

## 2019-03-06 NOTE — Op Note (Signed)
  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 Freda Jackson ( no other qualified 1st assist was available)  Anesthesia: IV sedation, marcaine .25% w epi and lidocaine 1%  Procedure: right IJ  Port placement with fluoroscopy under U/S guidance  Findings: Good position of the tip of the catheter by fluoroscopy  Estimated Blood Loss: Minimal         Drains: None         Specimens: None       Complications: none          Procedure Details  The patient was seen again in the Holding Room. The benefits, complications, treatment options, and expected outcomes were discussed with the patient. The risks of bleeding, infection, recurrence of symptoms, failure to resolve symptoms,  thrombosis nonfunction breakage pneumothorax hemopneumothorax any of which could require chest tube or further surgery were reviewed with the patient.   The patient was taken to Operating Room, identified as Linell Ruddock and the procedure verified.  A Time Out was held and the above information confirmed.  Prior to the induction of general anesthesia, antibiotic prophylaxis was administered. VTE prophylaxis was in place. Appropriate anesthesia was then administered and tolerated well. The chest was prepped with Chloraprep and draped in the sterile fashion. The patient was positioned in the supine position. Then the patient was placed in Trendelenburg position.  Patient was prepped and draped in sterile fashion and in a Trendelenburg position local anesthetic was infiltrated into the skin and subcutaneous tissues in the neck and anterior chest wall. The large bore needle was placed into the internal jugular vein under U/S guidance without difficulty and then the Seldinger wire was advanced. Fluoroscopy was utilized to confirm that the Seldinger wire was in the superior vena cava.  An incision was made and a port pocket developed  with blunt and electrocautery dissection. The introducer dilator was placed over the Seldinger wire the wire was removed. The previously flushed catheter was placed into the introducer dilator and the peel-away sheath was removed. The catheter length was confirmed and trimmed utilizing fluoroscopy for proper positioning. I used conray given the cloudy picture with pure fluoroscopy, we injected contrast via the catheter and confirmed its position into the SVC. The catheter was then attached to the previously flushed port. The port was placed into the pocket. The port was held in with 2-0 Prolenes and flushed for function and heparin locked.  The wound was closed with interrupted 3-0 Vicryl followed by 4-0 subcuticular Monocryl sutures. Dermabond used to coat the skin  Patient was taken to the recovery room in stable condition where a postoperative chest film has been ordered.

## 2019-03-06 NOTE — Discharge Instructions (Addendum)

## 2019-03-06 NOTE — H&P (Signed)
Patient ID: Keith Trujillo, male   DOB: 12-31-1960, 58 y.o.   MRN: [id number]  HPI Keith Trujillo is a 58 y.o. male near obstructing sigmoid cancer status post low anterior resection.  Cancer with positive mesenteric lymph nodes and very large extending into the rectum.  He is in need for chemotherapy and port placement. He is doing well.  No fevers or chills.  He is taking p.o. and his ileostomy is working well  HPI  Past Medical History:  Diagnosis Date  . Abdominal pain   . Allergy   . Cancer ([place])   . Cellulitis   . Dyspnea   . Liver spot   . Loose stools   . Weight loss     Past Surgical History:  Procedure Laterality Date  . COLONOSCOPY WITH PROPOFOL N/A 01/02/2019   Procedure: COLONOSCOPY WITH BIOPSY;  Surgeon: [name], MD;  Location: [place];  Service: Endoscopy;  Laterality: N/A;  Clip x2 at Biopsy Site  . COLOSTOMY Right 02/06/2019   Procedure: COLOSTOMY;  Surgeon: [name], MD;  Location: [facility];  Service: General;  Laterality: Right;  . excision of skin lesion    . LAPAROSCOPIC SIGMOID COLECTOMY N/A 02/06/2019   Procedure: LAPAROSCOPIC SIGMOID COLECTOMY converted to open procedure;  Surgeon: [name], MD;  Location: [facility];  Service: General;  Laterality: N/A;  . TONSILLECTOMY AND ADENOIDECTOMY  1969    Family History  Problem Relation Age of Onset  . Lung cancer Father     Social History Social History   Tobacco Use  . Smoking status: Light Tobacco Smoker    Types: Cigars  . Smokeless tobacco: Former Systems developer    Quit date: 1980  Substance Use Topics  . Alcohol use: Yes    Comment: rare  . Drug use: Never    Allergies  Allergen Reactions  . Sulfa Antibiotics Rash    Current Facility-Administered Medications  Medication Dose Route Frequency Provider Last Rate Last Admin  . ceFAZolin (ANCEF) 2-4 GM/100ML-% IVPB           . ceFAZolin (ANCEF) IVPB 2g/100 mL premix  2 g Intravenous On Call to [place], [place], MD      .  Chlorhexidine Gluconate Cloth 2 % PADS 6 each  6 each Topical Once Michai Dieppa, Marjory Lies, MD       And  . Chlorhexidine Gluconate Cloth 2 % PADS 6 each  6 each Topical Once Raechal Raben, [place] F, MD      . famotidine (PEPCID) tablet 20 mg  20 mg Oral Once Molli Barrows, MD      . lactated ringers infusion   Intravenous Continuous Molli Barrows, MD         Review of Systems Full ROS  was asked and was negative except for the information on the HPI  Physical Exam Blood pressure 137/72, pulse 65, temperature (!) 97.1 F (36.2 C), temperature source Tympanic, resp. rate 17, height 6\' 2"  (1.88 m), weight 82.1 kg, SpO2 100 %. CONSTITUTIONAL: NAD EYES: Pupils are equal, round, and reactive to light, Sclera are non-icteric. EARS, NOSE, MOUTH AND THROAT: The oropharynx is clear. The oral mucosa is pink and moist. Hearing is intact to voice. LYMPH NODES:  Lymph nodes in the neck are normal. RESPIRATORY:  Lungs are clear. There is normal respiratory effort, with equal breath sounds bilaterally, and without pathologic use of accessory muscles. CARDIOVASCULAR: Heart is regular without murmurs, gallops, or rubs. GI: The abdomen is  soft, nontender, and nondistended. Incision healing well. There is no hepatosplenomegaly. There are normal bowel sounds in all quadrants. Ileostomy patent. GU: Rectal deferred.   MUSCULOSKELETAL: Normal muscle strength and tone. No cyanosis or edema.   SKIN: Turgor is good and there are no pathologic skin lesions or ulcers. NEUROLOGIC: Motor and sensation is grossly normal. Cranial nerves are grossly intact. PSYCH:  Oriented to person, place and time. Affect is normal.  Data Reviewed  I have personally reviewed the patient's imaging, laboratory findings and medical records.    Assessment/Plan Large Sigmoid cancer stent into the rectum in need for chemotherapy and port placement.  Discussed with the patient detail about the procedure.  Risk benefits and possible complications  including but not limited to: Bleeding, infection, vascular injuries or pneumothorax.  He understands and wished to proceed   [name], MD FACS General Surgeon 03/06/2019, 11:32 AM

## 2019-03-17 ENCOUNTER — Inpatient Hospital Stay (HOSPITAL_BASED_OUTPATIENT_CLINIC_OR_DEPARTMENT_OTHER): Payer: 59 | Admitting: Oncology

## 2019-03-17 ENCOUNTER — Other Ambulatory Visit: Payer: Self-pay

## 2019-03-17 ENCOUNTER — Encounter: Payer: Self-pay | Admitting: Oncology

## 2019-03-17 ENCOUNTER — Inpatient Hospital Stay: Payer: 59

## 2019-03-17 VITALS — BP 135/71 | HR 68 | Temp 96.9°F | Ht 76.0 in | Wt 194.0 lb

## 2019-03-17 DIAGNOSIS — Z5111 Encounter for antineoplastic chemotherapy: Secondary | ICD-10-CM

## 2019-03-17 DIAGNOSIS — Z7189 Other specified counseling: Secondary | ICD-10-CM

## 2019-03-17 DIAGNOSIS — C187 Malignant neoplasm of sigmoid colon: Secondary | ICD-10-CM

## 2019-03-17 LAB — COMPREHENSIVE METABOLIC PANEL
ALT: 20 U/L (ref 0–44)
AST: 21 U/L (ref 15–41)
Albumin: 3.8 g/dL (ref 3.5–5.0)
Alkaline Phosphatase: 66 U/L (ref 38–126)
Anion gap: 9 (ref 5–15)
BUN: 16 mg/dL (ref 6–20)
CO2: 24 mmol/L (ref 22–32)
Calcium: 9 mg/dL (ref 8.9–10.3)
Chloride: 106 mmol/L (ref 98–111)
Creatinine, Ser: 0.7 mg/dL (ref 0.61–1.24)
GFR calc Af Amer: >60 mL/min (ref >60)
GFR calc non Af Amer: >60 mL/min (ref >60)
Glucose, Bld: 133 mg/dL — ABNORMAL HIGH (ref 70–99)
Potassium: 3.8 mmol/L (ref 3.5–5.1)
Sodium: 139 mmol/L (ref 135–145)
Total Bilirubin: 0.6 mg/dL (ref 0.3–1.2)
Total Protein: 6.3 g/dL — ABNORMAL LOW (ref 6.5–8.1)

## 2019-03-17 LAB — CBC WITH DIFFERENTIAL/PLATELET
Abs Immature Granulocytes: 0.01 10*3/uL (ref 0.00–0.07)
Basophils Absolute: 0.1 10*3/uL (ref 0.0–0.1)
Basophils Relative: 1 %
Eosinophils Absolute: 0.2 10*3/uL (ref 0.0–0.5)
Eosinophils Relative: 3 %
HCT: 35.3 % — ABNORMAL LOW (ref 39.0–52.0)
Hemoglobin: 11 g/dL — ABNORMAL LOW (ref 13.0–17.0)
Immature Granulocytes: 0 %
Lymphocytes Relative: 16 %
Lymphs Abs: 1 10*3/uL (ref 0.7–4.0)
MCH: 30.1 pg (ref 26.0–34.0)
MCHC: 31.2 g/dL (ref 30.0–36.0)
MCV: 96.7 fL (ref 80.0–100.0)
Monocytes Absolute: 0.4 10*3/uL (ref 0.1–1.0)
Monocytes Relative: 8 %
Neutro Abs: 4.2 10*3/uL (ref 1.7–7.7)
Neutrophils Relative %: 72 %
Platelets: 174 10*3/uL (ref 150–400)
RBC: 3.65 MIL/uL — ABNORMAL LOW (ref 4.22–5.81)
RDW: 13.7 % (ref 11.5–15.5)
WBC: 5.8 10*3/uL (ref 4.0–10.5)
nRBC: 0 % (ref 0.0–0.2)

## 2019-03-17 MED ORDER — SODIUM CHLORIDE 0.9 % IV SOLN
10.0000 mg | Freq: Once | INTRAVENOUS | Status: AC
  Administered 2019-03-17: 10 mg via INTRAVENOUS
  Filled 2019-03-17: qty 1

## 2019-03-17 MED ORDER — HEPARIN SOD (PORK) LOCK FLUSH 100 UNIT/ML IV SOLN
500.0000 [IU] | Freq: Once | INTRAVENOUS | Status: DC
  Filled 2019-03-17: qty 5

## 2019-03-17 MED ORDER — OXALIPLATIN CHEMO INJECTION 100 MG/20ML
85.0000 mg/m2 | Freq: Once | INTRAVENOUS | Status: AC
  Administered 2019-03-17: 11:00:00 185 mg via INTRAVENOUS
  Filled 2019-03-17: qty 37

## 2019-03-17 MED ORDER — SODIUM CHLORIDE 0.9 % IV SOLN
2400.0000 mg/m2 | INTRAVENOUS | Status: DC
  Administered 2019-03-17: 13:00:00 5200 mg via INTRAVENOUS
  Filled 2019-03-17: qty 100

## 2019-03-17 MED ORDER — SODIUM CHLORIDE 0.9% FLUSH
10.0000 mL | INTRAVENOUS | Status: DC | PRN
  Administered 2019-03-17: 09:00:00 10 mL via INTRAVENOUS
  Filled 2019-03-17: qty 10

## 2019-03-17 MED ORDER — DEXTROSE 5 % IV SOLN
Freq: Once | INTRAVENOUS | Status: AC
  Filled 2019-03-17: qty 250

## 2019-03-17 MED ORDER — PALONOSETRON HCL INJECTION 0.25 MG/5ML
0.2500 mg | Freq: Once | INTRAVENOUS | Status: AC
  Administered 2019-03-17: 10:00:00 0.25 mg via INTRAVENOUS
  Filled 2019-03-17: qty 5

## 2019-03-17 MED ORDER — LEUCOVORIN CALCIUM INJECTION 350 MG
393.5000 mg/m2 | Freq: Once | INTRAVENOUS | Status: AC
  Administered 2019-03-17: 850 mg via INTRAVENOUS
  Filled 2019-03-17: qty 42.5

## 2019-03-17 MED ORDER — FLUOROURACIL CHEMO INJECTION 2.5 GM/50ML
400.0000 mg/m2 | Freq: Once | INTRAVENOUS | Status: AC
  Administered 2019-03-17: 13:00:00 850 mg via INTRAVENOUS
  Filled 2019-03-17: qty 17

## 2019-03-17 NOTE — Progress Notes (Signed)
Hematology/Oncology Consult note [facility]  Telephone:([phone] [phone]  Patient Care Team: Kendell Bane, NP as PCP - General (Adult Health Nurse Practitioner) Clent Jacks, RN as Oncology Nurse Navigator   Name of the patient: Keith Trujillo  [id number]  [birth year]   Date of visit: 03/17/19  Diagnosis- stage IIIb colon adenocarcinoma  Chief complaint/ Reason for visit-on treatment assessment prior to cycle 1 of adjuvant FOLFOX chemotherapy  Heme/Onc history: patient is a 58 year old male who was seen by Dr. Leonides Schanz from GI for symptoms of diarrhea as well as abdominal pain.He underwent a colonoscopy on 01/02/2019 which showed an ulcerated partially obstructing large mass in the sigmoid colon which measured 10 cm in length biopsy showed adenocarcinoma moderately differentiated. Baseline CEA elevated at 71.7. He was seen by Dr. Dahlia Byes and will be undergoing laparoscopic hemicolectomy and lymph node sampling next week.  CT abdomen pelvis with contrast showed irregular marked circumferential wall thickening in the sigmoid colon near the rectosigmoid junction. The lesion longitudinally extends for approximately 5 to 6 cm. 4.1 x 2.8 cm ill-defined irregular paracolonic soft tissue mass may represent direct tumor extension or contiguous markedly enlarged lymph node. Abnormal lymph nodes also seen in the perirectal and presacral space concerning for metastatic disease. 1.5 cm ill-defined hypoattenuating lesion in the medial segment of the left liver which may be focal fatty deposition but cannot be definitively characterized.  MRI did not show any evidence of liver metastases.  CT chest was negative for metastatic disease.  Patient underwent a low anterior resection of the sigmoid mass.  Final pathology showed 8 cm invasive colorectal adenocarcinoma grade 2.  Tumor invades through the muscularis propria into pericolorectal tissue.  All margins are  uninvolvedLymphovascular invasion and perineural invasion present.  Lymph nodes 1+ out of 20 PT3PN1A.  MSI stable  Interval history-he feels well overall and denies any abdominal pain.  Bowel movements are regular.   ECOG PS- 1 Pain scale- 0   Review of systems- Review of Systems  Constitutional: Negative for chills, fever, malaise/fatigue and weight loss.  HENT: Negative for congestion, ear discharge and nosebleeds.   Eyes: Negative for blurred vision.  Respiratory: Negative for cough, hemoptysis, sputum production, shortness of breath and wheezing.   Cardiovascular: Negative for chest pain, palpitations, orthopnea and claudication.  Gastrointestinal: Negative for abdominal pain, blood in stool, constipation, diarrhea, heartburn, melena, nausea and vomiting.  Genitourinary: Negative for dysuria, flank pain, frequency, hematuria and urgency.  Musculoskeletal: Negative for back pain, joint pain and myalgias.  Skin: Negative for rash.  Neurological: Negative for dizziness, tingling, focal weakness, seizures, weakness and headaches.  Endo/Heme/Allergies: Does not bruise/bleed easily.  Psychiatric/Behavioral: Negative for depression and suicidal ideas. The patient does not have insomnia.        Allergies  Allergen Reactions  . Sulfa Antibiotics Rash     Past Medical History:  Diagnosis Date  . Abdominal pain   . Allergy   . Cancer ([place])   . Cellulitis   . Dyspnea   . Liver spot   . Loose stools   . Weight loss      Past Surgical History:  Procedure Laterality Date  . COLONOSCOPY WITH PROPOFOL N/A 01/02/2019   Procedure: COLONOSCOPY WITH BIOPSY;  Surgeon: [name], MD;  Location: [place];  Service: Endoscopy;  Laterality: N/A;  Clip x2 at Biopsy Site  . COLOSTOMY Right 02/06/2019   Procedure: COLOSTOMY;  Surgeon: [name], MD;  Location: [facility];  Service: General;  Laterality: Right;  . excision of skin lesion    . LAPAROSCOPIC SIGMOID COLECTOMY  N/A 02/06/2019   Procedure: LAPAROSCOPIC SIGMOID COLECTOMY converted to open procedure;  Surgeon: [name], MD;  Location: [facility];  Service: General;  Laterality: N/A;  . PORTACATH PLACEMENT Right 03/06/2019   Procedure: INSERTION PORT-A-CATH;  Surgeon: [name], MD;  Location: [facility];  Service: General;  Laterality: Right;  . TONSILLECTOMY AND ADENOIDECTOMY  1969    Social History   Socioeconomic History  . Marital status: Married    Spouse name: Not on file  . Number of children: Not on file  . Years of education: Not on file  . Highest education level: Not on file  Occupational History  . Not on file  Tobacco Use  . Smoking status: Light Tobacco Smoker    Types: Cigars  . Smokeless tobacco: Former Systems developer    Quit date: 1980  Substance and Sexual Activity  . Alcohol use: Yes    Comment: rare  . Drug use: Never  . Sexual activity: Yes  Other Topics Concern  . Not on file  Social History Narrative  . Not on file   Social Determinants of Health   Financial Resource Strain:   . Difficulty of Paying Living Expenses: Not on file  Food Insecurity:   . Worried About Charity fundraiser in the Last Year: Not on file  . Ran Out of Food in the Last Year: Not on file  Transportation Needs:   . Lack of Transportation (Medical): Not on file  . Lack of Transportation (Non-Medical): Not on file  Physical Activity:   . Days of Exercise per Week: Not on file  . Minutes of Exercise per Session: Not on file  Stress:   . Feeling of Stress : Not on file  Social Connections:   . Frequency of Communication with Friends and Family: Not on file  . Frequency of Social Gatherings with Friends and Family: Not on file  . Attends Religious Services: Not on file  . Active Member of Clubs or Organizations: Not on file  . Attends Archivist Meetings: Not on file  . Marital Status: Not on file  Intimate Partner Violence:   . Fear of Current or Ex-Partner: Not on file  .  Emotionally Abused: Not on file  . Physically Abused: Not on file  . Sexually Abused: Not on file    Family History  Problem Relation Age of Onset  . Lung cancer Father      Current Outpatient Medications:  .  dexamethasone (DECADRON) 4 MG tablet, Take 2 tablets (8 mg total) by mouth daily. Start the day after chemotherapy for 2 days. Take with food. (Patient not taking: Reported on 03/06/2019), Disp: 30 tablet, Rfl: 1 .  lidocaine-prilocaine (EMLA) cream, Apply to affected area once (Patient taking differently: Apply 1 application topically daily as needed (port). Apply to affected area once), Disp: 30 g, Rfl: 3 .  Loperamide HCl (IMODIUM PO), Take 2 mg by mouth 2 (two) times daily as needed (diarrhea or loose stools). , Disp: , Rfl:  .  ondansetron (ZOFRAN) 8 MG tablet, Take 1 tablet (8 mg total) by mouth 2 (two) times daily as needed for refractory nausea / vomiting. Start on day 3 after chemotherapy. (Patient not taking: Reported on 03/06/2019), Disp: 30 tablet, Rfl: 1 .  prochlorperazine (COMPAZINE) 10 MG tablet, Take 1 tablet (10 mg total) by mouth every  6 (six) hours as needed (Nausea or vomiting). (Patient not taking: Reported on 03/06/2019), Disp: 30 tablet, Rfl: 1  Physical exam:  Vitals:   03/17/19 0902  BP: 135/71  Pulse: 68  Temp: (!) 96.9 F (36.1 C)  TempSrc: Tympanic  Weight: 194 lb (88 kg)  Height: '6\' 4"'  (1.93 m)   Physical Exam HENT:     Head: Normocephalic and atraumatic.  Eyes:     Pupils: Pupils are equal, round, and reactive to light.  Cardiovascular:     Rate and Rhythm: Normal rate and regular rhythm.     Heart sounds: Normal heart sounds.  Pulmonary:     Effort: Pulmonary effort is normal.     Breath sounds: Normal breath sounds.  Abdominal:     General: Bowel sounds are normal.     Palpations: Abdomen is soft.     Comments: Diverting colostomy in place.  Surgical scar of recent surgery has healed well.  Musculoskeletal:     Cervical back: Normal  range of motion.  Skin:    General: Skin is warm and dry.  Neurological:     Mental Status: He is alert and oriented to person, place, and time.      CMP Latest Ref Rng & Units 03/17/2019  Glucose 70 - 99 mg/dL 133(H)  BUN 6 - 20 mg/dL 16  Creatinine 0.61 - 1.24 mg/dL 0.70  Sodium 135 - 145 mmol/L 139  Potassium 3.5 - 5.1 mmol/L 3.8  Chloride 98 - 111 mmol/L 106  CO2 22 - 32 mmol/L 24  Calcium 8.9 - 10.3 mg/dL 9.0  Total Protein 6.5 - 8.1 g/dL 6.3(L)  Total Bilirubin 0.3 - 1.2 mg/dL 0.6  Alkaline Phos 38 - 126 U/L 66  AST 15 - 41 U/L 21  ALT 0 - 44 U/L 20   CBC Latest Ref Rng & Units 03/17/2019  WBC 4.0 - 10.5 K/uL 5.8  Hemoglobin 13.0 - 17.0 g/dL 11.0(L)  Hematocrit 39.0 - 52.0 % 35.3(L)  Platelets 150 - 400 K/uL 174    No images are attached to the encounter.  DG Chest 1 View  Result Date: 03/06/2019 CLINICAL DATA:  Postop Port-A-Cath placement EXAM: CHEST  1 VIEW COMPARISON:  01/07/2019 FINDINGS: Right Port-A-Cath in place with the tip in the SVC. No pneumothorax. Heart is mildly enlarged. No confluent opacities or effusions. No acute bony abnormality. IMPRESSION: Right Port-A-Cath tip in the SVC.  No pneumothorax. Cardiomegaly.  No active disease. Electronically Signed   By: Rolm Baptise M.D.   On: 03/06/2019 14:09   CT Chest W Contrast  Result Date: 02/18/2019 CLINICAL DATA:  Staging workup for colon cancer. EXAM: CT CHEST WITH CONTRAST TECHNIQUE: Multidetector CT imaging of the chest was performed during intravenous contrast administration. CONTRAST:  25m OMNIPAQUE IOHEXOL 300 MG/ML  SOLN COMPARISON:  None. FINDINGS: Cardiovascular: The heart size appears within normal limits. No pericardial effusion identified. Mediastinum/Nodes: No enlarged mediastinal, hilar, or axillary lymph nodes. Thyroid gland, trachea, and esophagus demonstrate no significant findings. Lungs/Pleura: No pleural effusion identified. No airspace consolidation, atelectasis or pneumothorax. Calcified  granuloma identified within the right midlung, image 69/100. Noncalcified left lower lobe lung nodule measures 3 mm, image 122/3. 3 mm lingular nodule is also noted, image 113/3. Calcified nodule in the lingula. Upper Abdomen: Pneumoperitoneum identified, likely postoperative. Musculoskeletal: No chest wall abnormality. No acute or significant osseous findings. IMPRESSION: 1. No specific findings identified to suggest metastatic disease to the chest. 2. Two small nodules are identified within the  lingula and left lower lobe, nonspecific. These measure up to 3 mm. Attention on follow-up imaging advised. 3. Prior granulomatous disease. 4. Pneumoperitoneum, likely postoperative. Electronically Signed   By: Kerby Moors M.D.   On: 02/18/2019 14:08   MR Abdomen W Wo Contrast  Result Date: 02/19/2019 CLINICAL DATA:  Colon cancer with liver lesions seen on previous CT. EXAM: MRI ABDOMEN WITHOUT AND WITH CONTRAST TECHNIQUE: Multiplanar multisequence MR imaging of the abdomen was performed both before and after the administration of intravenous contrast. CONTRAST:  69m GADAVIST GADOBUTROL 1 MMOL/ML IV SOLN COMPARISON:  CT scan 01/21/2019 FINDINGS: Lower chest: Unremarkable. Hepatobiliary: Small focus of differential signal again identified in the medial segment left liver, adjacent to the falciform ligament, corresponding to the lesion identified on previous CT scan. This lesion loses signal intensity on out of phase T1 imaging compared to the in phase sequence, compatible with the presence of intralesional lipid. No evidence for enhancement after IV contrast administration. Scattered very tiny foci of signal void are identified along the dome of liver, between the liver in the hemidiaphragm. These are compatible with tiny gas bubbles as seen on previous CT of 02/18/2019. There is also some intraperitoneal free air identified anterior to the liver. Patient underwent right sigmoid colectomy on 02/06/2019 and while free  gas is not unexpected 7-10 days after surgery, residual free gas at 13 days postop is somewhat atypical. There is no evidence for gallstones, gallbladder wall thickening, or pericholecystic fluid. No intrahepatic or extrahepatic biliary dilation. Pancreas: No focal mass lesion. No dilatation of the main duct. No intraparenchymal cyst. No peripancreatic edema. Spleen:  No splenomegaly. No focal mass lesion. Adrenals/Urinary Tract: No adrenal nodule or mass. Kidneys unremarkable. Stomach/Bowel: Stomach is unremarkable. No gastric wall thickening. No evidence of outlet obstruction. Duodenum is normally positioned as is the ligament of Treitz. No small bowel or colonic dilatation within the visualized abdomen. Vascular/Lymphatic: No abdominal aortic aneurysm. No abdominal lymphadenopathy. Other: There is some edema and trace fluid noted in the left upper quadrant around the spleen. Musculoskeletal: No abnormal marrow enhancement within the visualized bony anatomy. IMPRESSION: 1. Lesion of concern in the medial segment left liver on previous CT of 01/21/2019 represents focal fatty deposition. No evidence for metastatic disease within the liver parenchyma. 2. Residual intraperitoneal free air in this patient 13 days out from surgery. While intraperitoneal free gas is not unexpected 7-10 days from surgery, residual gas on postoperative day 13 is considered atypical. Electronically Signed   By: EMisty StanleyM.D.   On: 02/19/2019 11:46   DG C-Arm 1-60 Min-No Report  Result Date: 03/06/2019 Fluoroscopy was utilized by the requesting physician.  No radiographic interpretation.     Assessment and plan- Patient is a 58y.o. 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  He is here for on treatment assessment prior to cycle 1 of adjuvant FOLFOX chemotherapy  Counts okay to proceed with cycle 1 of adjuvant FOLFOX chemotherapy today.  Again discussed risks and benefits of chemotherapy  including all but not limited to nausea, vomiting, low blood counts, risk of infections and hospitalization.  Risk of peripheral neuropathy associated with oxaliplatin.  Treatment is being given with a curative intent.  Patient understands and agrees to proceed as planned.  He will come back on day 3 for pump disconnect.  I will see him back in 2 weeks time with CBC weekly, CMP and CEA for cycle 2 of FOLFOX chemotherapy     Visit  Diagnosis 1. Encounter for antineoplastic chemotherapy   2. Cancer of sigmoid ([place])   3. Goals of care, counseling/discussion      Dr. Randa Evens, MD, MPH [facility] [phone] 03/17/2019 1:30 PM

## 2019-03-17 NOTE — Progress Notes (Signed)
Patient stated that he had been doing well with no complaints. 

## 2019-03-18 ENCOUNTER — Telehealth: Payer: Self-pay

## 2019-03-18 NOTE — Telephone Encounter (Signed)
Telephone call to patient for follow up after receiving first chemo yesterday but no answer.   Left message to let patient know I was calling to see how he was doing and encouraged patient to call for any questions or concerns.

## 2019-03-19 ENCOUNTER — Other Ambulatory Visit: Payer: Self-pay

## 2019-03-19 ENCOUNTER — Inpatient Hospital Stay: Payer: 59

## 2019-03-19 VITALS — BP 137/64 | HR 61 | Temp 96.6°F | Resp 20

## 2019-03-19 DIAGNOSIS — C187 Malignant neoplasm of sigmoid colon: Secondary | ICD-10-CM

## 2019-03-19 DIAGNOSIS — Z5111 Encounter for antineoplastic chemotherapy: Secondary | ICD-10-CM | POA: Diagnosis not present

## 2019-03-19 MED ORDER — HEPARIN SOD (PORK) LOCK FLUSH 100 UNIT/ML IV SOLN
500.0000 [IU] | Freq: Once | INTRAVENOUS | Status: AC | PRN
  Administered 2019-03-19: 15:00:00 500 [IU]
  Filled 2019-03-19: qty 5

## 2019-03-19 MED ORDER — SODIUM CHLORIDE 0.9% FLUSH
10.0000 mL | INTRAVENOUS | Status: DC | PRN
  Administered 2019-03-19: 10 mL
  Filled 2019-03-19: qty 10

## 2019-03-19 MED ORDER — HEPARIN SOD (PORK) LOCK FLUSH 100 UNIT/ML IV SOLN
INTRAVENOUS | Status: AC
  Filled 2019-03-19: qty 5

## 2019-03-20 ENCOUNTER — Ambulatory Visit: Payer: 59

## 2019-03-20 ENCOUNTER — Other Ambulatory Visit: Payer: 59

## 2019-03-20 ENCOUNTER — Ambulatory Visit: Payer: 59 | Admitting: Oncology

## 2019-03-20 NOTE — Anesthesia Postprocedure Evaluation (Signed)
Anesthesia Post Note  Patient: Keith Trujillo  Procedure(s) Performed: INSERTION PORT-A-CATH (Right Chest)  Patient location during evaluation: PACU Anesthesia Type: General Level of consciousness: awake and alert Pain management: pain level controlled Vital Signs Assessment: post-procedure vital signs reviewed and stable Respiratory status: spontaneous breathing, nonlabored ventilation and respiratory function stable Cardiovascular status: blood pressure returned to baseline and stable Postop Assessment: no apparent nausea or vomiting Anesthetic complications: no     Last Vitals:  Vitals:   03/06/19 1426 03/06/19 1449  BP: (!) 144/60 134/78  Pulse: (!) 54 (!) 59  Resp: 18 17  Temp: 36.7 C 36.8 C  SpO2: 100% 100%    Last Pain:  Vitals:   03/06/19 1449  TempSrc:   PainSc: 0-No pain                 Alphonsus Sias

## 2019-03-26 ENCOUNTER — Other Ambulatory Visit: Payer: Self-pay

## 2019-03-26 ENCOUNTER — Telehealth (INDEPENDENT_AMBULATORY_CARE_PROVIDER_SITE_OTHER): Payer: Self-pay | Admitting: Surgery

## 2019-03-26 DIAGNOSIS — Z09 Encounter for follow-up examination after completed treatment for conditions other than malignant neoplasm: Secondary | ICD-10-CM

## 2019-03-26 NOTE — Progress Notes (Signed)
S/p LAR for sigmoid CA Called pt on his cell Started chemo, port workng well taking Po , no emesis ileostomy working well No concerns , no pain Plan f/u 6-8 week and depending on condition may decide on ileostomy reversal vs waiting until he completes his chemo

## 2019-03-31 ENCOUNTER — Encounter: Payer: Self-pay | Admitting: Oncology

## 2019-03-31 ENCOUNTER — Inpatient Hospital Stay: Payer: No Typology Code available for payment source

## 2019-03-31 ENCOUNTER — Inpatient Hospital Stay: Payer: No Typology Code available for payment source | Attending: Oncology

## 2019-03-31 ENCOUNTER — Inpatient Hospital Stay (HOSPITAL_BASED_OUTPATIENT_CLINIC_OR_DEPARTMENT_OTHER): Payer: No Typology Code available for payment source | Admitting: Oncology

## 2019-03-31 ENCOUNTER — Other Ambulatory Visit: Payer: Self-pay

## 2019-03-31 VITALS — BP 146/71 | HR 64 | Temp 96.3°F | Ht 76.0 in | Wt 186.0 lb

## 2019-03-31 DIAGNOSIS — R0602 Shortness of breath: Secondary | ICD-10-CM | POA: Insufficient documentation

## 2019-03-31 DIAGNOSIS — T451X5A Adverse effect of antineoplastic and immunosuppressive drugs, initial encounter: Secondary | ICD-10-CM | POA: Diagnosis not present

## 2019-03-31 DIAGNOSIS — F17210 Nicotine dependence, cigarettes, uncomplicated: Secondary | ICD-10-CM | POA: Diagnosis not present

## 2019-03-31 DIAGNOSIS — Z5111 Encounter for antineoplastic chemotherapy: Secondary | ICD-10-CM | POA: Diagnosis present

## 2019-03-31 DIAGNOSIS — D6959 Other secondary thrombocytopenia: Secondary | ICD-10-CM | POA: Diagnosis not present

## 2019-03-31 DIAGNOSIS — D701 Agranulocytosis secondary to cancer chemotherapy: Secondary | ICD-10-CM | POA: Diagnosis not present

## 2019-03-31 DIAGNOSIS — Z95828 Presence of other vascular implants and grafts: Secondary | ICD-10-CM

## 2019-03-31 DIAGNOSIS — D72819 Decreased white blood cell count, unspecified: Secondary | ICD-10-CM | POA: Diagnosis not present

## 2019-03-31 DIAGNOSIS — C187 Malignant neoplasm of sigmoid colon: Secondary | ICD-10-CM

## 2019-03-31 DIAGNOSIS — D696 Thrombocytopenia, unspecified: Secondary | ICD-10-CM | POA: Insufficient documentation

## 2019-03-31 DIAGNOSIS — Z79899 Other long term (current) drug therapy: Secondary | ICD-10-CM | POA: Diagnosis not present

## 2019-03-31 DIAGNOSIS — R5383 Other fatigue: Secondary | ICD-10-CM | POA: Insufficient documentation

## 2019-03-31 DIAGNOSIS — R634 Abnormal weight loss: Secondary | ICD-10-CM | POA: Insufficient documentation

## 2019-03-31 DIAGNOSIS — R197 Diarrhea, unspecified: Secondary | ICD-10-CM | POA: Diagnosis not present

## 2019-03-31 DIAGNOSIS — Z7689 Persons encountering health services in other specified circumstances: Secondary | ICD-10-CM | POA: Insufficient documentation

## 2019-03-31 LAB — COMPREHENSIVE METABOLIC PANEL
ALT: 21 U/L (ref 0–44)
AST: 16 U/L (ref 15–41)
Albumin: 4.2 g/dL (ref 3.5–5.0)
Alkaline Phosphatase: 68 U/L (ref 38–126)
Anion gap: 9 (ref 5–15)
BUN: 13 mg/dL (ref 6–20)
CO2: 24 mmol/L (ref 22–32)
Calcium: 9.3 mg/dL (ref 8.9–10.3)
Chloride: 106 mmol/L (ref 98–111)
Creatinine, Ser: 0.74 mg/dL (ref 0.61–1.24)
GFR calc Af Amer: >60 mL/min (ref >60)
GFR calc non Af Amer: >60 mL/min (ref >60)
Glucose, Bld: 115 mg/dL — ABNORMAL HIGH (ref 70–99)
Potassium: 3.8 mmol/L (ref 3.5–5.1)
Sodium: 139 mmol/L (ref 135–145)
Total Bilirubin: 0.9 mg/dL (ref 0.3–1.2)
Total Protein: 6.6 g/dL (ref 6.5–8.1)

## 2019-03-31 LAB — CBC WITH DIFFERENTIAL/PLATELET
Abs Immature Granulocytes: 0 10*3/uL (ref 0.00–0.07)
Basophils Absolute: 0 10*3/uL (ref 0.0–0.1)
Basophils Relative: 1 %
Eosinophils Absolute: 0.1 10*3/uL (ref 0.0–0.5)
Eosinophils Relative: 3 %
HCT: 35.5 % — ABNORMAL LOW (ref 39.0–52.0)
Hemoglobin: 11.2 g/dL — ABNORMAL LOW (ref 13.0–17.0)
Immature Granulocytes: 0 %
Lymphocytes Relative: 23 %
Lymphs Abs: 0.9 10*3/uL (ref 0.7–4.0)
MCH: 30.4 pg (ref 26.0–34.0)
MCHC: 31.5 g/dL (ref 30.0–36.0)
MCV: 96.2 fL (ref 80.0–100.0)
Monocytes Absolute: 0.4 10*3/uL (ref 0.1–1.0)
Monocytes Relative: 10 %
Neutro Abs: 2.5 10*3/uL (ref 1.7–7.7)
Neutrophils Relative %: 63 %
Platelets: 127 10*3/uL — ABNORMAL LOW (ref 150–400)
RBC: 3.69 MIL/uL — ABNORMAL LOW (ref 4.22–5.81)
RDW: 13.3 % (ref 11.5–15.5)
WBC: 3.9 10*3/uL — ABNORMAL LOW (ref 4.0–10.5)
nRBC: 0 % (ref 0.0–0.2)

## 2019-03-31 MED ORDER — SODIUM CHLORIDE 0.9% FLUSH
10.0000 mL | Freq: Once | INTRAVENOUS | Status: AC
  Administered 2019-03-31: 10 mL via INTRAVENOUS
  Filled 2019-03-31: qty 10

## 2019-03-31 MED ORDER — LEUCOVORIN CALCIUM INJECTION 350 MG
393.5000 mg/m2 | Freq: Once | INTRAVENOUS | Status: AC
  Administered 2019-03-31: 10:00:00 850 mg via INTRAVENOUS
  Filled 2019-03-31: qty 25

## 2019-03-31 MED ORDER — SODIUM CHLORIDE 0.9 % IV SOLN
10.0000 mg | Freq: Once | INTRAVENOUS | Status: AC
  Administered 2019-03-31: 10 mg via INTRAVENOUS
  Filled 2019-03-31: qty 10

## 2019-03-31 MED ORDER — DEXTROSE 5 % IV SOLN
Freq: Once | INTRAVENOUS | Status: AC
  Filled 2019-03-31: qty 250

## 2019-03-31 MED ORDER — OXALIPLATIN CHEMO INJECTION 100 MG/20ML
65.0000 mg/m2 | Freq: Once | INTRAVENOUS | Status: AC
  Administered 2019-03-31: 10:00:00 140 mg via INTRAVENOUS
  Filled 2019-03-31: qty 20

## 2019-03-31 MED ORDER — PALONOSETRON HCL INJECTION 0.25 MG/5ML
0.2500 mg | Freq: Once | INTRAVENOUS | Status: AC
  Administered 2019-03-31: 09:00:00 0.25 mg via INTRAVENOUS
  Filled 2019-03-31: qty 5

## 2019-03-31 MED ORDER — FLUOROURACIL CHEMO INJECTION 2.5 GM/50ML
400.0000 mg/m2 | Freq: Once | INTRAVENOUS | Status: AC
  Administered 2019-03-31: 12:00:00 850 mg via INTRAVENOUS
  Filled 2019-03-31: qty 17

## 2019-03-31 MED ORDER — SODIUM CHLORIDE 0.9 % IV SOLN
2400.0000 mg/m2 | INTRAVENOUS | Status: DC
  Administered 2019-03-31: 12:00:00 5200 mg via INTRAVENOUS
  Filled 2019-03-31: qty 100

## 2019-03-31 NOTE — Progress Notes (Signed)
Patient stated that he had been doing well. Patient stated that he has had loose stools but after taking Imodium, they get a little better.

## 2019-04-01 LAB — CEA: CEA: 2 ng/mL (ref 0.0–4.7)

## 2019-04-01 NOTE — Progress Notes (Signed)
Hematology/Oncology Consult note [facility]  Telephone:([phone] [phone]  Patient Care Team: Kendell Bane, NP as PCP - General (Adult Health Nurse Practitioner) Clent Jacks, RN as Oncology Nurse Navigator   Name of the patient: Keith Trujillo  [id number]  Aug 12, 1960   Date of visit: 04/01/19  Diagnosis- stage IIIb colon adenocarcinoma  Chief complaint/ Reason for visit-on treatment assessment prior to cycle 2 of adjuvant FOLFOX chemotherapy  Heme/Onc history: patient is a 59 year old male who was seen by Dr. Leonides Schanz from GI for symptoms of diarrhea as well as abdominal pain.He underwent a colonoscopy on 01/02/2019 which showed an ulcerated partially obstructing large mass in the sigmoid colon which measured 10 cm in length biopsy showed adenocarcinoma moderately differentiated. Baseline CEA elevated at 71.7. He was seen by Dr. Dahlia Byes and will be undergoing laparoscopic hemicolectomy and lymph node sampling next week.  CT abdomen pelvis with contrast showed irregular marked circumferential wall thickening in the sigmoid colon near the rectosigmoid junction. The lesion longitudinally extends for approximately 5 to 6 cm. 4.1 x 2.8 cm ill-defined irregular paracolonic soft tissue mass may represent direct tumor extension or contiguous markedly enlarged lymph node. Abnormal lymph nodes also seen in the perirectal and presacral space concerning for metastatic disease. 1.5 cm ill-defined hypoattenuating lesion in the medial segment of the left liver which may be focal fatty deposition but cannot be definitively characterized.MRI did not show any evidence of liver metastases. CT chest was negative for metastatic disease.  Patient underwent a low anterior resection of the sigmoid mass. Final pathology showed 8 cm invasive colorectal adenocarcinoma grade 2. Tumor invades through the muscularis propria into pericolorectal tissue. All margins are  uninvolvedLymphovascular invasion and perineural invasion present. Lymph nodes 1+ out of 20 PT3PN1A. MSI stable  Adjuvant FOLFOX chemotherapy was started on 03/17/2019  Interval history-patient tolerated chemotherapy well without any significant side effects.  He did not report any significant nausea or vomiting.  Appetite is good and he denies any constipation or diarrhea.  He does report increased sensitivity and cold feeling in his bilateral fingers  ECOG PS- 1 Pain scale- 0 Opioid associated constipation- no  Review of systems- Review of Systems  Constitutional: Negative for chills, fever, malaise/fatigue and weight loss.  HENT: Negative for congestion, ear discharge and nosebleeds.   Eyes: Negative for blurred vision.  Respiratory: Negative for cough, hemoptysis, sputum production, shortness of breath and wheezing.   Cardiovascular: Negative for chest pain, palpitations, orthopnea and claudication.  Gastrointestinal: Negative for abdominal pain, blood in stool, constipation, diarrhea, heartburn, melena, nausea and vomiting.  Genitourinary: Negative for dysuria, flank pain, frequency, hematuria and urgency.  Musculoskeletal: Negative for back pain, joint pain and myalgias.  Skin: Negative for rash.  Neurological: Negative for dizziness, tingling, focal weakness, seizures, weakness and headaches.  Endo/Heme/Allergies: Does not bruise/bleed easily.  Psychiatric/Behavioral: Negative for depression and suicidal ideas. The patient does not have insomnia.       Allergies  Allergen Reactions  . Sulfa Antibiotics Rash     Past Medical History:  Diagnosis Date  . Abdominal pain   . Allergy   . Cancer ([place])   . Cellulitis   . Dyspnea   . Liver spot   . Loose stools   . Weight loss      Past Surgical History:  Procedure Laterality Date  . COLONOSCOPY WITH PROPOFOL N/A 01/02/2019   Procedure: COLONOSCOPY WITH BIOPSY;  Surgeon: [name], MD;  Location: [place]  [facility];  Service: Endoscopy;  Laterality: N/A;  Clip x2 at Biopsy Site  . COLOSTOMY Right 02/06/2019   Procedure: COLOSTOMY;  Surgeon: [name], MD;  Location: [facility];  Service: General;  Laterality: Right;  . excision of skin lesion    . LAPAROSCOPIC SIGMOID COLECTOMY N/A 02/06/2019   Procedure: LAPAROSCOPIC SIGMOID COLECTOMY converted to open procedure;  Surgeon: [name], MD;  Location: [facility];  Service: General;  Laterality: N/A;  . PORTACATH PLACEMENT Right 03/06/2019   Procedure: INSERTION PORT-A-CATH;  Surgeon: [name], MD;  Location: [facility];  Service: General;  Laterality: Right;  . TONSILLECTOMY AND ADENOIDECTOMY  1969    Social History   Socioeconomic History  . Marital status: Married    Spouse name: Not on file  . Number of children: Not on file  . Years of education: Not on file  . Highest education level: Not on file  Occupational History  . Not on file  Tobacco Use  . Smoking status: Light Tobacco Smoker    Types: Cigars  . Smokeless tobacco: Former Systems developer    Quit date: 1980  Substance and Sexual Activity  . Alcohol use: Yes    Comment: rare  . Drug use: Never  . Sexual activity: Yes  Other Topics Concern  . Not on file  Social History Narrative  . Not on file   Social Determinants of Health   Financial Resource Strain:   . Difficulty of Paying Living Expenses: Not on file  Food Insecurity:   . Worried About Charity fundraiser in the Last Year: Not on file  . Ran Out of Food in the Last Year: Not on file  Transportation Needs:   . Lack of Transportation (Medical): Not on file  . Lack of Transportation (Non-Medical): Not on file  Physical Activity:   . Days of Exercise per Week: Not on file  . Minutes of Exercise per Session: Not on file  Stress:   . Feeling of Stress : Not on file  Social Connections:   . Frequency of Communication with Friends and Family: Not on file  . Frequency of Social Gatherings with Friends and Family:  Not on file  . Attends Religious Services: Not on file  . Active Member of Clubs or Organizations: Not on file  . Attends Archivist Meetings: Not on file  . Marital Status: Not on file  Intimate Partner Violence:   . Fear of Current or Ex-Partner: Not on file  . Emotionally Abused: Not on file  . Physically Abused: Not on file  . Sexually Abused: Not on file    Family History  Problem Relation Age of Onset  . Lung cancer Father      Current Outpatient Medications:  .  dexamethasone (DECADRON) 4 MG tablet, Take 2 tablets (8 mg total) by mouth daily. Start the day after chemotherapy for 2 days. Take with food., Disp: 30 tablet, Rfl: 1 .  lidocaine-prilocaine (EMLA) cream, Apply to affected area once (Patient taking differently: Apply 1 application topically daily as needed (port). Apply to affected area once), Disp: 30 g, Rfl: 3 .  Loperamide HCl (IMODIUM PO), Take 2 mg by mouth 2 (two) times daily as needed (diarrhea or loose stools). , Disp: , Rfl:  .  ondansetron (ZOFRAN) 8 MG tablet, Take 1 tablet (8 mg total) by mouth 2 (two) times daily as needed for refractory nausea / vomiting. Start on day 3 after chemotherapy. (Patient not taking:  Reported on 03/31/2019), Disp: 30 tablet, Rfl: 1 .  prochlorperazine (COMPAZINE) 10 MG tablet, Take 1 tablet (10 mg total) by mouth every 6 (six) hours as needed (Nausea or vomiting). (Patient not taking: Reported on 03/06/2019), Disp: 30 tablet, Rfl: 1  Physical exam:  Vitals:   03/31/19 0848  BP: (!) 146/71  Pulse: 64  Temp: (!) 96.3 F (35.7 C)  TempSrc: Tympanic  Weight: 186 lb (84.4 kg)  Height: '6\' 4"'  (1.93 m)   Physical Exam HENT:     Head: Normocephalic and atraumatic.  Eyes:     Pupils: Pupils are equal, round, and reactive to light.  Cardiovascular:     Rate and Rhythm: Normal rate and regular rhythm.     Heart sounds: Normal heart sounds.  Pulmonary:     Effort: Pulmonary effort is normal.     Breath sounds: Normal  breath sounds.  Abdominal:     General: Bowel sounds are normal.     Palpations: Abdomen is soft.     Comments: Surgical scar has healed well  Musculoskeletal:     Cervical back: Normal range of motion.  Skin:    General: Skin is warm and dry.  Neurological:     Mental Status: He is alert and oriented to person, place, and time.      CMP Latest Ref Rng & Units 03/31/2019  Glucose 70 - 99 mg/dL 115(H)  BUN 6 - 20 mg/dL 13  Creatinine 0.61 - 1.24 mg/dL 0.74  Sodium 135 - 145 mmol/L 139  Potassium 3.5 - 5.1 mmol/L 3.8  Chloride 98 - 111 mmol/L 106  CO2 22 - 32 mmol/L 24  Calcium 8.9 - 10.3 mg/dL 9.3  Total Protein 6.5 - 8.1 g/dL 6.6  Total Bilirubin 0.3 - 1.2 mg/dL 0.9  Alkaline Phos 38 - 126 U/L 68  AST 15 - 41 U/L 16  ALT 0 - 44 U/L 21   CBC Latest Ref Rng & Units 03/31/2019  WBC 4.0 - 10.5 K/uL 3.9(L)  Hemoglobin 13.0 - 17.0 g/dL 11.2(L)  Hematocrit 39.0 - 52.0 % 35.5(L)  Platelets 150 - 400 K/uL 127(L)    No images are attached to the encounter.  DG Chest 1 View  Result Date: 03/06/2019 CLINICAL DATA:  Postop Port-A-Cath placement EXAM: CHEST  1 VIEW COMPARISON:  01/07/2019 FINDINGS: Right Port-A-Cath in place with the tip in the SVC. No pneumothorax. Heart is mildly enlarged. No confluent opacities or effusions. No acute bony abnormality. IMPRESSION: Right Port-A-Cath tip in the SVC.  No pneumothorax. Cardiomegaly.  No active disease. Electronically Signed   By: Rolm Baptise M.D.   On: 03/06/2019 14:09   DG C-Arm 1-60 Min-No Report  Result Date: 03/06/2019 Fluoroscopy was utilized by the requesting physician.  No radiographic interpretation.     Assessment and plan- Patient is a 59 y.o. 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   He is here for on treatment assessment prior to cycle 2 of adjuvant FOLFOX chemotherapy  I have personally reviewed patient's labs done today.  He does have a mild leukopenia with a white count of 3.9  and ANC of 2.5.  Also has mild thrombocytopenia with a platelet count of 127.  He does report increased sensitivity in his bilateral fingers after receiving 1 dose of oxaliplatin.  I will therefore reduce his oxaliplatin dose to 65 mg per metered square.  Counts are otherwise okay to proceed with cycle 2 of adjuvant FOLFOX chemotherapy today.  He will come back on day 3 for pump disconnect and receive Udenyca on that day given his leukopenia  I will see him back in 2 weeks time with CBC with differential, CMP for cycle 3 of adjuvant FOLFOX chemotherapy   Visit Diagnosis 1. Encounter for antineoplastic chemotherapy   2. Chemotherapy-induced thrombocytopenia   3. Chemotherapy induced neutropenia (HCC)   4. Cancer of sigmoid [facility])      Dr. Randa Evens, MD, MPH [facility] at [facility] [phone] 04/01/2019 8:49 AM

## 2019-04-02 ENCOUNTER — Inpatient Hospital Stay: Payer: No Typology Code available for payment source

## 2019-04-02 ENCOUNTER — Other Ambulatory Visit: Payer: Self-pay

## 2019-04-02 VITALS — BP 128/70 | HR 65 | Resp 20

## 2019-04-02 DIAGNOSIS — C187 Malignant neoplasm of sigmoid colon: Secondary | ICD-10-CM

## 2019-04-02 DIAGNOSIS — Z5111 Encounter for antineoplastic chemotherapy: Secondary | ICD-10-CM | POA: Diagnosis not present

## 2019-04-02 MED ORDER — HEPARIN SOD (PORK) LOCK FLUSH 100 UNIT/ML IV SOLN
INTRAVENOUS | Status: AC
  Filled 2019-04-02: qty 5

## 2019-04-02 MED ORDER — HEPARIN SOD (PORK) LOCK FLUSH 100 UNIT/ML IV SOLN
500.0000 [IU] | Freq: Once | INTRAVENOUS | Status: AC | PRN
  Administered 2019-04-02: 15:00:00 500 [IU]
  Filled 2019-04-02: qty 5

## 2019-04-02 MED ORDER — PEGFILGRASTIM-CBQV 6 MG/0.6ML ~~LOC~~ SOSY
6.0000 mg | PREFILLED_SYRINGE | Freq: Once | SUBCUTANEOUS | Status: AC
  Administered 2019-04-02: 15:00:00 6 mg via SUBCUTANEOUS
  Filled 2019-04-02: qty 0.6

## 2019-04-02 MED ORDER — SODIUM CHLORIDE 0.9% FLUSH
10.0000 mL | INTRAVENOUS | Status: DC | PRN
  Administered 2019-04-02: 15:00:00 10 mL
  Filled 2019-04-02: qty 10

## 2019-04-14 ENCOUNTER — Other Ambulatory Visit: Payer: Self-pay

## 2019-04-14 ENCOUNTER — Inpatient Hospital Stay (HOSPITAL_BASED_OUTPATIENT_CLINIC_OR_DEPARTMENT_OTHER): Payer: No Typology Code available for payment source | Admitting: Oncology

## 2019-04-14 ENCOUNTER — Encounter: Payer: Self-pay | Admitting: Oncology

## 2019-04-14 ENCOUNTER — Inpatient Hospital Stay: Payer: No Typology Code available for payment source

## 2019-04-14 VITALS — BP 133/71 | HR 57 | Temp 95.4°F | Resp 16 | Wt 189.9 lb

## 2019-04-14 DIAGNOSIS — C187 Malignant neoplasm of sigmoid colon: Secondary | ICD-10-CM

## 2019-04-14 DIAGNOSIS — D6959 Other secondary thrombocytopenia: Secondary | ICD-10-CM

## 2019-04-14 DIAGNOSIS — Z5111 Encounter for antineoplastic chemotherapy: Secondary | ICD-10-CM

## 2019-04-14 DIAGNOSIS — T451X5A Adverse effect of antineoplastic and immunosuppressive drugs, initial encounter: Secondary | ICD-10-CM | POA: Diagnosis not present

## 2019-04-14 LAB — COMPREHENSIVE METABOLIC PANEL
ALT: 20 U/L (ref 0–44)
AST: 19 U/L (ref 15–41)
Albumin: 4.1 g/dL (ref 3.5–5.0)
Alkaline Phosphatase: 107 U/L (ref 38–126)
Anion gap: 9 (ref 5–15)
BUN: 10 mg/dL (ref 6–20)
CO2: 23 mmol/L (ref 22–32)
Calcium: 9 mg/dL (ref 8.9–10.3)
Chloride: 106 mmol/L (ref 98–111)
Creatinine, Ser: 0.71 mg/dL (ref 0.61–1.24)
GFR calc Af Amer: >60 mL/min (ref >60)
GFR calc non Af Amer: >60 mL/min (ref >60)
Glucose, Bld: 110 mg/dL — ABNORMAL HIGH (ref 70–99)
Potassium: 3.6 mmol/L (ref 3.5–5.1)
Sodium: 138 mmol/L (ref 135–145)
Total Bilirubin: 0.6 mg/dL (ref 0.3–1.2)
Total Protein: 6.5 g/dL (ref 6.5–8.1)

## 2019-04-14 LAB — CBC WITH DIFFERENTIAL/PLATELET
Abs Immature Granulocytes: 0.53 10*3/uL — ABNORMAL HIGH (ref 0.00–0.07)
Basophils Absolute: 0.1 10*3/uL (ref 0.0–0.1)
Basophils Relative: 1 %
Eosinophils Absolute: 0.1 10*3/uL (ref 0.0–0.5)
Eosinophils Relative: 1 %
HCT: 35.5 % — ABNORMAL LOW (ref 39.0–52.0)
Hemoglobin: 11.3 g/dL — ABNORMAL LOW (ref 13.0–17.0)
Immature Granulocytes: 4 %
Lymphocytes Relative: 10 %
Lymphs Abs: 1.2 10*3/uL (ref 0.7–4.0)
MCH: 30.5 pg (ref 26.0–34.0)
MCHC: 31.8 g/dL (ref 30.0–36.0)
MCV: 95.9 fL (ref 80.0–100.0)
Monocytes Absolute: 0.6 10*3/uL (ref 0.1–1.0)
Monocytes Relative: 5 %
Neutro Abs: 9.7 10*3/uL — ABNORMAL HIGH (ref 1.7–7.7)
Neutrophils Relative %: 79 %
Platelets: 75 10*3/uL — ABNORMAL LOW (ref 150–400)
RBC: 3.7 MIL/uL — ABNORMAL LOW (ref 4.22–5.81)
RDW: 14.1 % (ref 11.5–15.5)
WBC: 12.1 10*3/uL — ABNORMAL HIGH (ref 4.0–10.5)
nRBC: 0 % (ref 0.0–0.2)

## 2019-04-14 MED ORDER — HEPARIN SOD (PORK) LOCK FLUSH 100 UNIT/ML IV SOLN
500.0000 [IU] | Freq: Once | INTRAVENOUS | Status: AC
  Administered 2019-04-14: 10:00:00 500 [IU] via INTRAVENOUS
  Filled 2019-04-14: qty 5

## 2019-04-14 MED ORDER — SODIUM CHLORIDE 0.9% FLUSH
10.0000 mL | Freq: Once | INTRAVENOUS | Status: AC
  Administered 2019-04-14: 08:00:00 10 mL via INTRAVENOUS
  Filled 2019-04-14: qty 10

## 2019-04-14 NOTE — Progress Notes (Signed)
Pt states that he got fatigued after 5 th day from chemo. He did take claritin for 4 days and he did not get achy. He has been queasy but he will snack on things and feel better. His colostomy has liquid coming through it and surgeon rec: to eat sweet potatoes and it has helped but he is taking 3 imodium through the day and because she stool is liq. He will inc. To 4 a day .

## 2019-04-14 NOTE — Progress Notes (Signed)
Hematology/Oncology Consult note [facility]  [phone] [phone]  Patient Care Team: Kendell Bane, NP as PCP - General (Adult Health Nurse Practitioner) Clent Jacks, RN as Oncology Nurse Navigator   Name of the patient: Keith Trujillo  [id number]  05-Jul-1960   Date of visit: 04/14/19  Diagnosis- stage IIIb colon adenocarcinoma  Chief complaint/ Reason for visit-on treatment assessment prior to cycle 3 of adjuvant FOLFOX chemotherapy  Heme/Onc history: patient is a 59 year old male who was seen by Dr. Leonides Schanz from GI for symptoms of diarrhea as well as abdominal pain.He underwent a colonoscopy on 01/02/2019 which showed an ulcerated partially obstructing large mass in the sigmoid colon which measured 10 cm in length biopsy showed adenocarcinoma moderately differentiated. Baseline CEA elevated at 71.7. He was seen by Dr. Dahlia Byes and will be undergoing laparoscopic hemicolectomy and lymph node sampling next week.  CT abdomen pelvis with contrast showed irregular marked circumferential wall thickening in the sigmoid colon near the rectosigmoid junction. The lesion longitudinally extends for approximately 5 to 6 cm. 4.1 x 2.8 cm ill-defined irregular paracolonic soft tissue mass may represent direct tumor extension or contiguous markedly enlarged lymph node. Abnormal lymph nodes also seen in the perirectal and presacral space concerning for metastatic disease. 1.5 cm ill-defined hypoattenuating lesion in the medial segment of the left liver which may be focal fatty deposition but cannot be definitively characterized.MRI did not show any evidence of liver metastases. CT chest was negative for metastatic disease.  Patient underwent a low anterior resection of the sigmoid mass. Final pathology showed 8 cm invasive colorectal adenocarcinoma grade 2. Tumor invades through the muscularis propria into pericolorectal tissue. All margins are  uninvolvedLymphovascular invasion and perineural invasion present. Lymph nodes 1+ out of 20 PT3PN1A. MSI stable  Adjuvant FOLFOX chemotherapy was started on 03/17/2019  Interval history-patient reports having loose bowel movements especially after his chemo and he is currently taking about 3-4 doses of Imodium.  He is also increasing intake of foods like sweet potatoes as suggested by surgery and reports that it has been helping him.  Reports fatigue but denies other complaints  ECOG PS- 1 Pain scale- 0   Review of systems- Review of Systems  Constitutional: Positive for malaise/fatigue. Negative for chills, fever and weight loss.  HENT: Negative for congestion, ear discharge and nosebleeds.   Eyes: Negative for blurred vision.  Respiratory: Negative for cough, hemoptysis, sputum production, shortness of breath and wheezing.   Cardiovascular: Negative for chest pain, palpitations, orthopnea and claudication.  Gastrointestinal: Positive for diarrhea. Negative for abdominal pain, blood in stool, constipation, heartburn, melena, nausea and vomiting.  Genitourinary: Negative for dysuria, flank pain, frequency, hematuria and urgency.  Musculoskeletal: Negative for back pain, joint pain and myalgias.  Skin: Negative for rash.  Neurological: Negative for dizziness, tingling, focal weakness, seizures, weakness and headaches.  Endo/Heme/Allergies: Does not bruise/bleed easily.  Psychiatric/Behavioral: Negative for depression and suicidal ideas. The patient does not have insomnia.       Allergies  Allergen Reactions  . Sulfa Antibiotics Rash     Past Medical History:  Diagnosis Date  . Abdominal pain   . Allergy   . Cancer ([place])   . Cellulitis   . Colon cancer ([place])   . Dyspnea   . Liver spot   . Loose stools   . Weight loss      Past Surgical History:  Procedure Laterality Date  . COLONOSCOPY WITH PROPOFOL N/A 01/02/2019  Procedure: COLONOSCOPY WITH BIOPSY;  Surgeon: [name], MD;  Location: [place];  Service: Endoscopy;  Laterality: N/A;  Clip x2 at Biopsy Site  . COLOSTOMY Right 02/06/2019   Procedure: COLOSTOMY;  Surgeon: [name], MD;  Location: [facility];  Service: General;  Laterality: Right;  . excision of skin lesion    . LAPAROSCOPIC SIGMOID COLECTOMY N/A 02/06/2019   Procedure: LAPAROSCOPIC SIGMOID COLECTOMY converted to open procedure;  Surgeon: [name], MD;  Location: [facility];  Service: General;  Laterality: N/A;  . PORTACATH PLACEMENT Right 03/06/2019   Procedure: INSERTION PORT-A-CATH;  Surgeon: [name], MD;  Location: [facility];  Service: General;  Laterality: Right;  . TONSILLECTOMY AND ADENOIDECTOMY  1969    Social History   Socioeconomic History  . Marital status: Married    Spouse name: Not on file  . Number of children: Not on file  . Years of education: Not on file  . Highest education level: Not on file  Occupational History  . Not on file  Tobacco Use  . Smoking status: Light Tobacco Smoker    Types: Cigars  . Smokeless tobacco: Former Systems developer    Quit date: 1980  Substance and Sexual Activity  . Alcohol use: Yes    Comment: rare  . Drug use: Never  . Sexual activity: Yes  Other Topics Concern  . Not on file  Social History Narrative  . Not on file   Social Determinants of Health   Financial Resource Strain:   . Difficulty of Paying Living Expenses: Not on file  Food Insecurity:   . Worried About Charity fundraiser in the Last Year: Not on file  . Ran Out of Food in the Last Year: Not on file  Transportation Needs:   . Lack of Transportation (Medical): Not on file  . Lack of Transportation (Non-Medical): Not on file  Physical Activity:   . Days of Exercise per Week: Not on file  . Minutes of Exercise per Session: Not on file  Stress:   . Feeling of Stress : Not on file  Social Connections:   . Frequency of Communication with Friends and Family: Not on file  . Frequency of Social  Gatherings with Friends and Family: Not on file  . Attends Religious Services: Not on file  . Active Member of Clubs or Organizations: Not on file  . Attends Archivist Meetings: Not on file  . Marital Status: Not on file  Intimate Partner Violence:   . Fear of Current or Ex-Partner: Not on file  . Emotionally Abused: Not on file  . Physically Abused: Not on file  . Sexually Abused: Not on file    Family History  Problem Relation Age of Onset  . Lung cancer Father      Current Outpatient Medications:  .  dexamethasone (DECADRON) 4 MG tablet, Take 2 tablets (8 mg total) by mouth daily. Start the day after chemotherapy for 2 days. Take with food., Disp: 30 tablet, Rfl: 1 .  lidocaine-prilocaine (EMLA) cream, Apply to affected area once (Patient taking differently: Apply 1 application topically daily as needed (port). Apply to affected area once), Disp: 30 g, Rfl: 3 .  Loperamide HCl (IMODIUM PO), Take 2 mg by mouth 2 (two) times daily as needed (diarrhea or loose stools). , Disp: , Rfl:  .  loratadine (CLARITIN) 10 MG tablet, Take 10 mg by mouth daily. Just around chemo for 4 days, Disp: ,  Rfl:  .  ondansetron (ZOFRAN) 8 MG tablet, Take 1 tablet (8 mg total) by mouth 2 (two) times daily as needed for refractory nausea / vomiting. Start on day 3 after chemotherapy., Disp: 30 tablet, Rfl: 1 .  prochlorperazine (COMPAZINE) 10 MG tablet, Take 1 tablet (10 mg total) by mouth every 6 (six) hours as needed (Nausea or vomiting)., Disp: 30 tablet, Rfl: 1  Physical exam:  Vitals:   04/14/19 0853  BP: 133/71  Pulse: (!) 57  Resp: 16  Temp: (!) 95.4 F (35.2 C)  TempSrc: Tympanic  Weight: 189 lb 14.4 oz (86.1 kg)   Physical Exam Constitutional:      General: He is not in acute distress. HENT:     Head: Normocephalic and atraumatic.  Eyes:     Pupils: Pupils are equal, round, and reactive to light.  Cardiovascular:     Rate and Rhythm: Normal rate and regular rhythm.      Heart sounds: Normal heart sounds.  Pulmonary:     Effort: Pulmonary effort is normal.     Breath sounds: Normal breath sounds.  Abdominal:     General: Bowel sounds are normal.     Palpations: Abdomen is soft.     Comments: Colostomy in place.  Functioning well and has semisolid stool  Musculoskeletal:     Cervical back: Normal range of motion.  Skin:    General: Skin is warm and dry.  Neurological:     Mental Status: He is alert and oriented to person, place, and time.      CMP Latest Ref Rng & Units 04/14/2019  Glucose 70 - 99 mg/dL 110(H)  BUN 6 - 20 mg/dL 10  Creatinine 0.61 - 1.24 mg/dL 0.71  Sodium 135 - 145 mmol/L 138  Potassium 3.5 - 5.1 mmol/L 3.6  Chloride 98 - 111 mmol/L 106  CO2 22 - 32 mmol/L 23  Calcium 8.9 - 10.3 mg/dL 9.0  Total Protein 6.5 - 8.1 g/dL 6.5  Total Bilirubin 0.3 - 1.2 mg/dL 0.6  Alkaline Phos 38 - 126 U/L 107  AST 15 - 41 U/L 19  ALT 0 - 44 U/L 20   CBC Latest Ref Rng & Units 04/14/2019  WBC 4.0 - 10.5 K/uL 12.1(H)  Hemoglobin 13.0 - 17.0 g/dL 11.3(L)  Hematocrit 39.0 - 52.0 % 35.5(L)  Platelets 150 - 400 K/uL 75(L)      Assessment and plan- Patient is a 59 y.o. 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  He is here for on treatment assessment prior to cycle 3 of adjuvant FOLFOX chemotherapy  Patient received Udenyca with day 3 of pump disconnect after cycle 2 and his white cell count is better at 12.1 today.  Hemoglobin stable around 11.  However platelet counts are down to 75.  He will therefore not be receiving chemotherapy today.  He will directly return to clinic in 1 week for CBC with differential and if his platelet counts are greater than 100 he will proceed with FOLFOX.  Oxaliplatin has already been dose reduced to 65 mg per metered square. I will drop the 13f bolus for next cycle  I will see him back in 3 weeks time with CBC with differential, CMP for cycle 4 of adjuvant FOLFOX chemotherapy and  if his platelet counts are not sufficiently high at that time we may have to give his chemotherapy every 3 weeks instead of every 2 weeks.  I would like to give  oxaliplatin at least for 3 months before I decide to hold it  Chemo-induced diarrhea: Continue to monitor currently on as needed Imodium   Visit Diagnosis 1. Chemotherapy-induced thrombocytopenia   2. Encounter for antineoplastic chemotherapy   3. Cancer of sigmoid [facility])      Dr. Randa Evens, MD, MPH [facility] at [facility] [phone] 04/14/2019 3:45 PM

## 2019-04-16 ENCOUNTER — Inpatient Hospital Stay: Payer: No Typology Code available for payment source

## 2019-04-21 ENCOUNTER — Other Ambulatory Visit: Payer: 59

## 2019-04-21 ENCOUNTER — Inpatient Hospital Stay: Payer: No Typology Code available for payment source

## 2019-04-21 ENCOUNTER — Inpatient Hospital Stay: Payer: No Typology Code available for payment source | Attending: Oncology

## 2019-04-21 ENCOUNTER — Other Ambulatory Visit: Payer: Self-pay

## 2019-04-21 VITALS — BP 144/81 | HR 62 | Temp 96.0°F | Resp 18

## 2019-04-21 DIAGNOSIS — F17210 Nicotine dependence, cigarettes, uncomplicated: Secondary | ICD-10-CM | POA: Diagnosis not present

## 2019-04-21 DIAGNOSIS — C187 Malignant neoplasm of sigmoid colon: Secondary | ICD-10-CM | POA: Diagnosis not present

## 2019-04-21 DIAGNOSIS — Z801 Family history of malignant neoplasm of trachea, bronchus and lung: Secondary | ICD-10-CM | POA: Diagnosis not present

## 2019-04-21 DIAGNOSIS — R634 Abnormal weight loss: Secondary | ICD-10-CM | POA: Diagnosis not present

## 2019-04-21 DIAGNOSIS — T451X5A Adverse effect of antineoplastic and immunosuppressive drugs, initial encounter: Secondary | ICD-10-CM | POA: Insufficient documentation

## 2019-04-21 DIAGNOSIS — Z95828 Presence of other vascular implants and grafts: Secondary | ICD-10-CM

## 2019-04-21 DIAGNOSIS — D6959 Other secondary thrombocytopenia: Secondary | ICD-10-CM | POA: Insufficient documentation

## 2019-04-21 DIAGNOSIS — Z79899 Other long term (current) drug therapy: Secondary | ICD-10-CM | POA: Diagnosis not present

## 2019-04-21 DIAGNOSIS — Z5111 Encounter for antineoplastic chemotherapy: Secondary | ICD-10-CM | POA: Insufficient documentation

## 2019-04-21 DIAGNOSIS — G620 Drug-induced polyneuropathy: Secondary | ICD-10-CM | POA: Diagnosis not present

## 2019-04-21 LAB — CBC WITH DIFFERENTIAL/PLATELET
Abs Immature Granulocytes: 0.07 10*3/uL (ref 0.00–0.07)
Basophils Absolute: 0.1 10*3/uL (ref 0.0–0.1)
Basophils Relative: 0 %
Eosinophils Absolute: 0 10*3/uL (ref 0.0–0.5)
Eosinophils Relative: 0 %
HCT: 34.6 % — ABNORMAL LOW (ref 39.0–52.0)
Hemoglobin: 10.9 g/dL — ABNORMAL LOW (ref 13.0–17.0)
Immature Granulocytes: 1 %
Lymphocytes Relative: 10 %
Lymphs Abs: 1.1 10*3/uL (ref 0.7–4.0)
MCH: 30.4 pg (ref 26.0–34.0)
MCHC: 31.5 g/dL (ref 30.0–36.0)
MCV: 96.4 fL (ref 80.0–100.0)
Monocytes Absolute: 0.7 10*3/uL (ref 0.1–1.0)
Monocytes Relative: 6 %
Neutro Abs: 9.3 10*3/uL — ABNORMAL HIGH (ref 1.7–7.7)
Neutrophils Relative %: 83 %
Platelets: 124 10*3/uL — ABNORMAL LOW (ref 150–400)
RBC: 3.59 MIL/uL — ABNORMAL LOW (ref 4.22–5.81)
RDW: 14.8 % (ref 11.5–15.5)
WBC: 11.2 10*3/uL — ABNORMAL HIGH (ref 4.0–10.5)
nRBC: 0 % (ref 0.0–0.2)

## 2019-04-21 LAB — COMPREHENSIVE METABOLIC PANEL
ALT: 22 U/L (ref 0–44)
AST: 18 U/L (ref 15–41)
Albumin: 4.1 g/dL (ref 3.5–5.0)
Alkaline Phosphatase: 85 U/L (ref 38–126)
Anion gap: 8 (ref 5–15)
BUN: 12 mg/dL (ref 6–20)
CO2: 25 mmol/L (ref 22–32)
Calcium: 9.1 mg/dL (ref 8.9–10.3)
Chloride: 104 mmol/L (ref 98–111)
Creatinine, Ser: 0.78 mg/dL (ref 0.61–1.24)
GFR calc Af Amer: >60 mL/min (ref >60)
GFR calc non Af Amer: >60 mL/min (ref >60)
Glucose, Bld: 100 mg/dL — ABNORMAL HIGH (ref 70–99)
Potassium: 3.7 mmol/L (ref 3.5–5.1)
Sodium: 137 mmol/L (ref 135–145)
Total Bilirubin: 0.5 mg/dL (ref 0.3–1.2)
Total Protein: 6.3 g/dL — ABNORMAL LOW (ref 6.5–8.1)

## 2019-04-21 MED ORDER — SODIUM CHLORIDE 0.9 % IV SOLN
2400.0000 mg/m2 | INTRAVENOUS | Status: DC
  Administered 2019-04-21: 15:00:00 5200 mg via INTRAVENOUS
  Filled 2019-04-21: qty 100

## 2019-04-21 MED ORDER — LEUCOVORIN CALCIUM INJECTION 350 MG
850.0000 mg | Freq: Once | INTRAVENOUS | Status: AC
  Administered 2019-04-21: 12:00:00 850 mg via INTRAVENOUS
  Filled 2019-04-21: qty 25

## 2019-04-21 MED ORDER — OXALIPLATIN CHEMO INJECTION 100 MG/20ML
65.0000 mg/m2 | Freq: Once | INTRAVENOUS | Status: AC
  Administered 2019-04-21: 12:00:00 140 mg via INTRAVENOUS
  Filled 2019-04-21: qty 20

## 2019-04-21 MED ORDER — PALONOSETRON HCL INJECTION 0.25 MG/5ML
0.2500 mg | Freq: Once | INTRAVENOUS | Status: AC
  Administered 2019-04-21: 12:00:00 0.25 mg via INTRAVENOUS
  Filled 2019-04-21: qty 5

## 2019-04-21 MED ORDER — SODIUM CHLORIDE 0.9 % IV SOLN
10.0000 mg | Freq: Once | INTRAVENOUS | Status: AC
  Administered 2019-04-21: 12:00:00 10 mg via INTRAVENOUS
  Filled 2019-04-21: qty 10

## 2019-04-21 MED ORDER — DEXTROSE 5 % IV SOLN
Freq: Once | INTRAVENOUS | Status: AC
  Filled 2019-04-21: qty 250

## 2019-04-21 MED ORDER — SODIUM CHLORIDE 0.9% FLUSH
10.0000 mL | Freq: Once | INTRAVENOUS | Status: AC
  Administered 2019-04-21: 10 mL via INTRAVENOUS
  Filled 2019-04-21: qty 10

## 2019-04-23 ENCOUNTER — Inpatient Hospital Stay: Payer: No Typology Code available for payment source

## 2019-04-23 ENCOUNTER — Other Ambulatory Visit: Payer: Self-pay

## 2019-04-23 DIAGNOSIS — Z5111 Encounter for antineoplastic chemotherapy: Secondary | ICD-10-CM | POA: Diagnosis not present

## 2019-04-23 DIAGNOSIS — C187 Malignant neoplasm of sigmoid colon: Secondary | ICD-10-CM

## 2019-04-23 MED ORDER — SODIUM CHLORIDE 0.9% FLUSH
10.0000 mL | INTRAVENOUS | Status: DC | PRN
  Administered 2019-04-23: 10 mL
  Filled 2019-04-23: qty 10

## 2019-04-23 MED ORDER — HEPARIN SOD (PORK) LOCK FLUSH 100 UNIT/ML IV SOLN
500.0000 [IU] | Freq: Once | INTRAVENOUS | Status: AC | PRN
  Administered 2019-04-23: 500 [IU]
  Filled 2019-04-23: qty 5

## 2019-04-23 MED ORDER — HEPARIN SOD (PORK) LOCK FLUSH 100 UNIT/ML IV SOLN
INTRAVENOUS | Status: AC
  Filled 2019-04-23: qty 5

## 2019-05-05 ENCOUNTER — Inpatient Hospital Stay (HOSPITAL_BASED_OUTPATIENT_CLINIC_OR_DEPARTMENT_OTHER): Payer: No Typology Code available for payment source | Admitting: Oncology

## 2019-05-05 ENCOUNTER — Inpatient Hospital Stay: Payer: No Typology Code available for payment source

## 2019-05-05 ENCOUNTER — Encounter: Payer: Self-pay | Admitting: Oncology

## 2019-05-05 ENCOUNTER — Other Ambulatory Visit: Payer: Self-pay

## 2019-05-05 VITALS — BP 137/74 | HR 66 | Temp 96.6°F | Resp 16 | Wt 189.9 lb

## 2019-05-05 DIAGNOSIS — T451X5A Adverse effect of antineoplastic and immunosuppressive drugs, initial encounter: Secondary | ICD-10-CM

## 2019-05-05 DIAGNOSIS — Z5111 Encounter for antineoplastic chemotherapy: Secondary | ICD-10-CM | POA: Diagnosis not present

## 2019-05-05 DIAGNOSIS — G620 Drug-induced polyneuropathy: Secondary | ICD-10-CM | POA: Diagnosis not present

## 2019-05-05 DIAGNOSIS — D6959 Other secondary thrombocytopenia: Secondary | ICD-10-CM | POA: Diagnosis not present

## 2019-05-05 DIAGNOSIS — C187 Malignant neoplasm of sigmoid colon: Secondary | ICD-10-CM

## 2019-05-05 DIAGNOSIS — Z95828 Presence of other vascular implants and grafts: Secondary | ICD-10-CM

## 2019-05-05 LAB — CBC WITH DIFFERENTIAL/PLATELET
Abs Immature Granulocytes: 0.01 10*3/uL (ref 0.00–0.07)
Basophils Absolute: 0 10*3/uL (ref 0.0–0.1)
Basophils Relative: 0 %
Eosinophils Absolute: 0.1 10*3/uL (ref 0.0–0.5)
Eosinophils Relative: 1 %
HCT: 35.1 % — ABNORMAL LOW (ref 39.0–52.0)
Hemoglobin: 11.2 g/dL — ABNORMAL LOW (ref 13.0–17.0)
Immature Granulocytes: 0 %
Lymphocytes Relative: 17 %
Lymphs Abs: 0.8 10*3/uL (ref 0.7–4.0)
MCH: 30.8 pg (ref 26.0–34.0)
MCHC: 31.9 g/dL (ref 30.0–36.0)
MCV: 96.4 fL (ref 80.0–100.0)
Monocytes Absolute: 0.5 10*3/uL (ref 0.1–1.0)
Monocytes Relative: 11 %
Neutro Abs: 3.6 10*3/uL (ref 1.7–7.7)
Neutrophils Relative %: 71 %
Platelets: 148 10*3/uL — ABNORMAL LOW (ref 150–400)
RBC: 3.64 MIL/uL — ABNORMAL LOW (ref 4.22–5.81)
RDW: 14.4 % (ref 11.5–15.5)
WBC: 5.1 10*3/uL (ref 4.0–10.5)
nRBC: 0 % (ref 0.0–0.2)

## 2019-05-05 LAB — COMPREHENSIVE METABOLIC PANEL
ALT: 17 U/L (ref 0–44)
AST: 14 U/L — ABNORMAL LOW (ref 15–41)
Albumin: 4 g/dL (ref 3.5–5.0)
Alkaline Phosphatase: 70 U/L (ref 38–126)
Anion gap: 7 (ref 5–15)
BUN: 15 mg/dL (ref 6–20)
CO2: 24 mmol/L (ref 22–32)
Calcium: 8.9 mg/dL (ref 8.9–10.3)
Chloride: 107 mmol/L (ref 98–111)
Creatinine, Ser: 0.76 mg/dL (ref 0.61–1.24)
GFR calc Af Amer: >60 mL/min (ref >60)
GFR calc non Af Amer: >60 mL/min (ref >60)
Glucose, Bld: 111 mg/dL — ABNORMAL HIGH (ref 70–99)
Potassium: 3.8 mmol/L (ref 3.5–5.1)
Sodium: 138 mmol/L (ref 135–145)
Total Bilirubin: 0.6 mg/dL (ref 0.3–1.2)
Total Protein: 6.3 g/dL — ABNORMAL LOW (ref 6.5–8.1)

## 2019-05-05 MED ORDER — PALONOSETRON HCL INJECTION 0.25 MG/5ML
0.2500 mg | Freq: Once | INTRAVENOUS | Status: AC
  Administered 2019-05-05: 0.25 mg via INTRAVENOUS
  Filled 2019-05-05: qty 5

## 2019-05-05 MED ORDER — SODIUM CHLORIDE 0.9% FLUSH
10.0000 mL | Freq: Once | INTRAVENOUS | Status: AC
  Administered 2019-05-05: 10 mL via INTRAVENOUS
  Filled 2019-05-05: qty 10

## 2019-05-05 MED ORDER — LEUCOVORIN CALCIUM INJECTION 350 MG
850.0000 mg | Freq: Once | INTRAVENOUS | Status: AC
  Administered 2019-05-05: 850 mg via INTRAVENOUS
  Filled 2019-05-05: qty 25

## 2019-05-05 MED ORDER — DEXTROSE 5 % IV SOLN
Freq: Once | INTRAVENOUS | Status: AC
  Filled 2019-05-05: qty 250

## 2019-05-05 MED ORDER — SODIUM CHLORIDE 0.9 % IV SOLN
10.0000 mg | Freq: Once | INTRAVENOUS | Status: AC
  Administered 2019-05-05: 10 mg via INTRAVENOUS
  Filled 2019-05-05: qty 10

## 2019-05-05 MED ORDER — OXALIPLATIN CHEMO INJECTION 100 MG/20ML
65.0000 mg/m2 | Freq: Once | INTRAVENOUS | Status: AC
  Administered 2019-05-05: 140 mg via INTRAVENOUS
  Filled 2019-05-05: qty 20

## 2019-05-05 MED ORDER — SODIUM CHLORIDE 0.9 % IV SOLN
2400.0000 mg/m2 | INTRAVENOUS | Status: DC
  Administered 2019-05-05: 5200 mg via INTRAVENOUS
  Filled 2019-05-05: qty 100

## 2019-05-05 NOTE — Progress Notes (Signed)
Hematology/Oncology Consult note [facility]  Telephone:([phone] [phone]  Patient Care Team: Kendell Bane, NP as PCP - General (Adult Health Nurse Practitioner) Clent Jacks, RN as Oncology Nurse Navigator   Name of the patient: Keith Trujillo  [id number]  1960/07/23   Date of visit: 05/05/19  Diagnosis- stage IIIb colon adenocarcinoma  Chief complaint/ Reason for visit-on treatment assessment prior to cycle 4 of adjuvant FOLFOX chemotherapy  Heme/Onc history: patient is a 59 year old male who was seen by Dr. Allen Norris from GI for symptoms of diarrhea as well as abdominal pain.He underwent a colonoscopy on 01/02/2019 which showed an ulcerated partially obstructing large mass in the sigmoid colon which measured 10 cm in length biopsy showed adenocarcinoma moderately differentiated. Baseline CEA elevated at 71.7. He was seen by Dr. Dahlia Byes and will be undergoing laparoscopic hemicolectomy and lymph node sampling next week.  CT abdomen pelvis with contrast showed irregular marked circumferential wall thickening in the sigmoid colon near the rectosigmoid junction. The lesion longitudinally extends for approximately 5 to 6 cm. 4.1 x 2.8 cm ill-defined irregular paracolonic soft tissue mass may represent direct tumor extension or contiguous markedly enlarged lymph node. Abnormal lymph nodes also seen in the perirectal and presacral space concerning for metastatic disease. 1.5 cm ill-defined hypoattenuating lesion in the medial segment of the left liver which may be focal fatty deposition but cannot be definitively characterized.MRI did not show any evidence of liver metastases. CT chest was negative for metastatic disease.  Patient underwent a low anterior resection of the sigmoid mass. Final pathology showed 8 cm invasive colorectal adenocarcinoma grade 2. Tumor invades through the muscularis propria into pericolorectal tissue. All margins are  uninvolvedLymphovascular invasion and perineural invasion present. Lymph nodes 1+ out of 20 PT3PN1A. MSI stable  Adjuvant FOLFOX chemotherapy was started on 03/17/2019   Interval history-he is doing well and denies any complaints at this time.  Denies any significant tingling numbness in his extremities  ECOG PS- 0 Pain scale- 0  Review of systems- Review of Systems  Constitutional: Negative for chills, fever, malaise/fatigue and weight loss.  HENT: Negative for congestion, ear discharge and nosebleeds.   Eyes: Negative for blurred vision.  Respiratory: Negative for cough, hemoptysis, sputum production, shortness of breath and wheezing.   Cardiovascular: Negative for chest pain, palpitations, orthopnea and claudication.  Gastrointestinal: Negative for abdominal pain, blood in stool, constipation, diarrhea, heartburn, melena, nausea and vomiting.  Genitourinary: Negative for dysuria, flank pain, frequency, hematuria and urgency.  Musculoskeletal: Negative for back pain, joint pain and myalgias.  Skin: Negative for rash.  Neurological: Negative for dizziness, tingling, focal weakness, seizures, weakness and headaches.  Endo/Heme/Allergies: Does not bruise/bleed easily.  Psychiatric/Behavioral: Negative for depression and suicidal ideas. The patient does not have insomnia.        Allergies  Allergen Reactions  . Sulfa Antibiotics Rash     Past Medical History:  Diagnosis Date  . Abdominal pain   . Allergy   . Cancer ([place])   . Cellulitis   . Colon cancer ([place])   . Dyspnea   . Liver spot   . Loose stools   . Weight loss      Past Surgical History:  Procedure Laterality Date  . COLONOSCOPY WITH PROPOFOL N/A 01/02/2019   Procedure: COLONOSCOPY WITH BIOPSY;  Surgeon: [name], MD;  Location: [place];  Service: Endoscopy;  Laterality: N/A;  Clip x2 at Biopsy Site  . COLOSTOMY Right 02/06/2019  Procedure: COLOSTOMY;  Surgeon: [name], MD;   Location: [facility];  Service: General;  Laterality: Right;  . excision of skin lesion    . LAPAROSCOPIC SIGMOID COLECTOMY N/A 02/06/2019   Procedure: LAPAROSCOPIC SIGMOID COLECTOMY converted to open procedure;  Surgeon: [name], MD;  Location: [facility];  Service: General;  Laterality: N/A;  . PORTACATH PLACEMENT Right 03/06/2019   Procedure: INSERTION PORT-A-CATH;  Surgeon: [name], MD;  Location: [facility];  Service: General;  Laterality: Right;  . TONSILLECTOMY AND ADENOIDECTOMY  1969    Social History   Socioeconomic History  . Marital status: Married    Spouse name: Not on file  . Number of children: Not on file  . Years of education: Not on file  . Highest education level: Not on file  Occupational History  . Not on file  Tobacco Use  . Smoking status: Light Tobacco Smoker    Types: Cigars  . Smokeless tobacco: Former Systems developer    Quit date: 1980  Substance and Sexual Activity  . Alcohol use: Yes    Comment: rare  . Drug use: Never  . Sexual activity: Yes  Other Topics Concern  . Not on file  Social History Narrative  . Not on file   Social Determinants of Health   Financial Resource Strain:   . Difficulty of Paying Living Expenses: Not on file  Food Insecurity:   . Worried About Charity fundraiser in the Last Year: Not on file  . Ran Out of Food in the Last Year: Not on file  Transportation Needs:   . Lack of Transportation (Medical): Not on file  . Lack of Transportation (Non-Medical): Not on file  Physical Activity:   . Days of Exercise per Week: Not on file  . Minutes of Exercise per Session: Not on file  Stress:   . Feeling of Stress : Not on file  Social Connections:   . Frequency of Communication with Friends and Family: Not on file  . Frequency of Social Gatherings with Friends and Family: Not on file  . Attends Religious Services: Not on file  . Active Member of Clubs or Organizations: Not on file  . Attends Archivist Meetings:  Not on file  . Marital Status: Not on file  Intimate Partner Violence:   . Fear of Current or Ex-Partner: Not on file  . Emotionally Abused: Not on file  . Physically Abused: Not on file  . Sexually Abused: Not on file    Family History  Problem Relation Age of Onset  . Lung cancer Father      Current Outpatient Medications:  .  dexamethasone (DECADRON) 4 MG tablet, Take 2 tablets (8 mg total) by mouth daily. Start the day after chemotherapy for 2 days. Take with food., Disp: 30 tablet, Rfl: 1 .  lidocaine-prilocaine (EMLA) cream, Apply to affected area once, Disp: 30 g, Rfl: 3 .  Loperamide HCl (IMODIUM PO), Take 2 mg by mouth 2 (two) times daily as needed (diarrhea or loose stools). , Disp: , Rfl:  .  loratadine (CLARITIN) 10 MG tablet, Take 10 mg by mouth daily. Just around chemo for 4 days, Disp: , Rfl:  .  ondansetron (ZOFRAN) 8 MG tablet, Take 1 tablet (8 mg total) by mouth 2 (two) times daily as needed for refractory nausea / vomiting. Start on day 3 after chemotherapy. (Patient not taking: Reported on 05/05/2019), Disp: 30 tablet, Rfl: 1 .  prochlorperazine (COMPAZINE)  10 MG tablet, Take 1 tablet (10 mg total) by mouth every 6 (six) hours as needed (Nausea or vomiting). (Patient not taking: Reported on 05/05/2019), Disp: 30 tablet, Rfl: 1 No current facility-administered medications for this visit.  Facility-Administered Medications Ordered in Other Visits:  .  fluorouracil (ADRUCIL) 5,200 mg in sodium chloride 0.9 % 146 mL chemo infusion, 2,400 mg/m2 (Treatment Plan Recorded), Intravenous, 1 day or 1 dose, Sindy Guadeloupe, MD, 5,200 mg at 05/05/19 1153  Physical exam:  Vitals:   05/05/19 0840  BP: 137/74  Pulse: 66  Resp: 16  Temp: (!) 96.6 F (35.9 C)  TempSrc: Tympanic  Weight: 189 lb 14.4 oz (86.1 kg)   Physical Exam HENT:     Head: Normocephalic and atraumatic.  Eyes:     Pupils: Pupils are equal, round, and reactive to light.  Cardiovascular:     Rate and  Rhythm: Normal rate and regular rhythm.     Heart sounds: Normal heart sounds.  Pulmonary:     Effort: Pulmonary effort is normal.     Breath sounds: Normal breath sounds.  Abdominal:     General: Bowel sounds are normal.     Palpations: Abdomen is soft.     Comments: Colostomy in place  Musculoskeletal:     Cervical back: Normal range of motion.  Skin:    General: Skin is warm and dry.  Neurological:     Mental Status: He is alert and oriented to person, place, and time.      CMP Latest Ref Rng & Units 05/05/2019  Glucose 70 - 99 mg/dL 111(H)  BUN 6 - 20 mg/dL 15  Creatinine 0.61 - 1.24 mg/dL 0.76  Sodium 135 - 145 mmol/L 138  Potassium 3.5 - 5.1 mmol/L 3.8  Chloride 98 - 111 mmol/L 107  CO2 22 - 32 mmol/L 24  Calcium 8.9 - 10.3 mg/dL 8.9  Total Protein 6.5 - 8.1 g/dL 6.3(L)  Total Bilirubin 0.3 - 1.2 mg/dL 0.6  Alkaline Phos 38 - 126 U/L 70  AST 15 - 41 U/L 14(L)  ALT 0 - 44 U/L 17   CBC Latest Ref Rng & Units 05/05/2019  WBC 4.0 - 10.5 K/uL 5.1  Hemoglobin 13.0 - 17.0 g/dL 11.2(L)  Hematocrit 39.0 - 52.0 % 35.1(L)  Platelets 150 - 400 K/uL 148(L)    Assessment and plan- Patient is a 59 y.o. 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   He is here for on treatment assessment prior to cycle 4 of adjuvant FOLFOX chemotherapy  Counts okay to proceed with cycle 4 of adjuvant FOLFOX chemotherapy.  He will come back on day 3 for pump disconnect and received Udenyca on that day.  He has been requiring growth factor support every other cycle.  He will not be receiving 5-FU bolus and we have reduce the dose of oxaliplatin to 65 mg per metered square given his chemo-induced thrombocytopenia.  I will see him back in 2 weeks time with CBC with differential, CMP for cycle 5 of adjuvant FOLFOX chemotherapy  Chemo-induced peripheral neuropathy: Mild grade 1 continue to monitor.  Oxaliplatin dose reduced as above   Visit Diagnosis 1. Encounter for  antineoplastic chemotherapy   2. Chemotherapy-induced thrombocytopenia   3. Cancer of sigmoid ([place])   4. Chemotherapy-induced peripheral neuropathy ([place])      Dr. Randa Evens, MD, MPH [facility] at [facility] [phone] 05/05/2019 4:05 PM

## 2019-05-05 NOTE — Progress Notes (Signed)
Pt with diarrhea with colostomy and the week of chemo he is up to 4 pills a day for the diarrhea

## 2019-05-07 ENCOUNTER — Telehealth: Payer: Self-pay | Admitting: Emergency Medicine

## 2019-05-07 ENCOUNTER — Inpatient Hospital Stay: Payer: No Typology Code available for payment source | Attending: Oncology

## 2019-05-07 ENCOUNTER — Encounter: Payer: Self-pay | Admitting: Surgery

## 2019-05-07 ENCOUNTER — Other Ambulatory Visit: Payer: Self-pay

## 2019-05-07 ENCOUNTER — Ambulatory Visit (INDEPENDENT_AMBULATORY_CARE_PROVIDER_SITE_OTHER): Payer: Self-pay | Admitting: Surgery

## 2019-05-07 VITALS — BP 124/74 | HR 75 | Temp 97.4°F | Resp 12 | Ht 76.0 in | Wt 192.0 lb

## 2019-05-07 VITALS — BP 143/83 | HR 67 | Resp 18

## 2019-05-07 DIAGNOSIS — Z09 Encounter for follow-up examination after completed treatment for conditions other than malignant neoplasm: Secondary | ICD-10-CM

## 2019-05-07 DIAGNOSIS — Z7689 Persons encountering health services in other specified circumstances: Secondary | ICD-10-CM | POA: Diagnosis not present

## 2019-05-07 DIAGNOSIS — C187 Malignant neoplasm of sigmoid colon: Secondary | ICD-10-CM | POA: Diagnosis present

## 2019-05-07 MED ORDER — PEGFILGRASTIM-CBQV 6 MG/0.6ML ~~LOC~~ SOSY
6.0000 mg | PREFILLED_SYRINGE | Freq: Once | SUBCUTANEOUS | Status: AC
  Administered 2019-05-07: 15:00:00 6 mg via SUBCUTANEOUS
  Filled 2019-05-07 (×2): qty 0.6

## 2019-05-07 MED ORDER — SODIUM CHLORIDE 0.9% FLUSH
10.0000 mL | INTRAVENOUS | Status: DC | PRN
  Administered 2019-05-07: 10 mL
  Filled 2019-05-07: qty 10

## 2019-05-07 MED ORDER — HEPARIN SOD (PORK) LOCK FLUSH 100 UNIT/ML IV SOLN
500.0000 [IU] | Freq: Once | INTRAVENOUS | Status: AC | PRN
  Administered 2019-05-07: 15:00:00 500 [IU]
  Filled 2019-05-07: qty 5

## 2019-05-07 MED ORDER — HEPARIN SOD (PORK) LOCK FLUSH 100 UNIT/ML IV SOLN
INTRAVENOUS | Status: AC
  Filled 2019-05-07: qty 5

## 2019-05-07 NOTE — Addendum Note (Signed)
Addended by: Celene Kras on: 05/07/2019 09:52 AM   Modules accepted: Orders

## 2019-05-07 NOTE — Patient Instructions (Signed)
We will schedule a Barium Enema for you and contact you with an appointment. We will see you in about 2 months after you have this procedure.   Please call the office if you have any questions or concerns.

## 2019-05-07 NOTE — Telephone Encounter (Signed)
Pt called back to the office. Informed him of his appt. Pt verbalized understanding.

## 2019-05-07 NOTE — Telephone Encounter (Signed)
Patient is scheduled for a Barium Enema 06/04/19 at [place] at the [facility]. Arrival 8:45am NPO after midnight. Since pt has Colostomy site pt does not need prep kit.   Patient placed in recall box to schedule follow up appointment with Dr Dahlia Byes after Barium Enema done.   Called patient at this time with no answer. Left vm to call the office back.

## 2019-05-07 NOTE — Progress Notes (Signed)
S/p lap ultra low  LAR w diverting loop for sigmoid ca Doing chemo Thrombocytopenia and leukopenia + PO ostomy working  PE NAD Abd: soft, nt, ileostomy in place patent.   A/P Doing well Given pancytopenia we will wait until he finishes chemo before ileostomy take down. We will obtain BE in the interim

## 2019-05-15 ENCOUNTER — Encounter: Payer: 59 | Admitting: Licensed Clinical Social Worker

## 2019-05-19 ENCOUNTER — Inpatient Hospital Stay: Payer: No Typology Code available for payment source | Attending: Oncology

## 2019-05-19 ENCOUNTER — Inpatient Hospital Stay (HOSPITAL_BASED_OUTPATIENT_CLINIC_OR_DEPARTMENT_OTHER): Payer: No Typology Code available for payment source | Admitting: Oncology

## 2019-05-19 ENCOUNTER — Other Ambulatory Visit: Payer: Self-pay

## 2019-05-19 ENCOUNTER — Inpatient Hospital Stay: Payer: No Typology Code available for payment source

## 2019-05-19 ENCOUNTER — Encounter: Payer: Self-pay | Admitting: Oncology

## 2019-05-19 VITALS — BP 117/73 | HR 69 | Temp 96.7°F | Resp 16 | Wt 187.7 lb

## 2019-05-19 DIAGNOSIS — G629 Polyneuropathy, unspecified: Secondary | ICD-10-CM | POA: Insufficient documentation

## 2019-05-19 DIAGNOSIS — D6959 Other secondary thrombocytopenia: Secondary | ICD-10-CM | POA: Diagnosis not present

## 2019-05-19 DIAGNOSIS — Z79899 Other long term (current) drug therapy: Secondary | ICD-10-CM | POA: Insufficient documentation

## 2019-05-19 DIAGNOSIS — F17210 Nicotine dependence, cigarettes, uncomplicated: Secondary | ICD-10-CM | POA: Diagnosis not present

## 2019-05-19 DIAGNOSIS — D696 Thrombocytopenia, unspecified: Secondary | ICD-10-CM | POA: Insufficient documentation

## 2019-05-19 DIAGNOSIS — C187 Malignant neoplasm of sigmoid colon: Secondary | ICD-10-CM | POA: Diagnosis not present

## 2019-05-19 DIAGNOSIS — Z5111 Encounter for antineoplastic chemotherapy: Secondary | ICD-10-CM | POA: Insufficient documentation

## 2019-05-19 DIAGNOSIS — T451X5A Adverse effect of antineoplastic and immunosuppressive drugs, initial encounter: Secondary | ICD-10-CM | POA: Diagnosis not present

## 2019-05-19 DIAGNOSIS — Z85038 Personal history of other malignant neoplasm of large intestine: Secondary | ICD-10-CM | POA: Diagnosis not present

## 2019-05-19 LAB — CBC WITH DIFFERENTIAL/PLATELET
Abs Immature Granulocytes: 0.12 10*3/uL — ABNORMAL HIGH (ref 0.00–0.07)
Basophils Absolute: 0.1 10*3/uL (ref 0.0–0.1)
Basophils Relative: 0 %
Eosinophils Absolute: 0.1 10*3/uL (ref 0.0–0.5)
Eosinophils Relative: 1 %
HCT: 38.3 % — ABNORMAL LOW (ref 39.0–52.0)
Hemoglobin: 12 g/dL — ABNORMAL LOW (ref 13.0–17.0)
Immature Granulocytes: 1 %
Lymphocytes Relative: 9 %
Lymphs Abs: 1.1 10*3/uL (ref 0.7–4.0)
MCH: 30 pg (ref 26.0–34.0)
MCHC: 31.3 g/dL (ref 30.0–36.0)
MCV: 95.8 fL (ref 80.0–100.0)
Monocytes Absolute: 0.7 10*3/uL (ref 0.1–1.0)
Monocytes Relative: 5 %
Neutro Abs: 10.4 10*3/uL — ABNORMAL HIGH (ref 1.7–7.7)
Neutrophils Relative %: 84 %
Platelets: 72 10*3/uL — ABNORMAL LOW (ref 150–400)
RBC: 4 MIL/uL — ABNORMAL LOW (ref 4.22–5.81)
RDW: 14.7 % (ref 11.5–15.5)
WBC: 12.4 10*3/uL — ABNORMAL HIGH (ref 4.0–10.5)
nRBC: 0 % (ref 0.0–0.2)

## 2019-05-19 LAB — COMPREHENSIVE METABOLIC PANEL
ALT: 35 U/L (ref 0–44)
AST: 25 U/L (ref 15–41)
Albumin: 4.1 g/dL (ref 3.5–5.0)
Alkaline Phosphatase: 119 U/L (ref 38–126)
Anion gap: 8 (ref 5–15)
BUN: 17 mg/dL (ref 6–20)
CO2: 24 mmol/L (ref 22–32)
Calcium: 9 mg/dL (ref 8.9–10.3)
Chloride: 106 mmol/L (ref 98–111)
Creatinine, Ser: 0.91 mg/dL (ref 0.61–1.24)
GFR calc Af Amer: >60 mL/min (ref >60)
GFR calc non Af Amer: >60 mL/min (ref >60)
Glucose, Bld: 104 mg/dL — ABNORMAL HIGH (ref 70–99)
Potassium: 4.1 mmol/L (ref 3.5–5.1)
Sodium: 138 mmol/L (ref 135–145)
Total Bilirubin: 0.4 mg/dL (ref 0.3–1.2)
Total Protein: 6.7 g/dL (ref 6.5–8.1)

## 2019-05-19 MED ORDER — HEPARIN SOD (PORK) LOCK FLUSH 100 UNIT/ML IV SOLN
500.0000 [IU] | Freq: Once | INTRAVENOUS | Status: AC
  Administered 2019-05-19: 500 [IU] via INTRAVENOUS
  Filled 2019-05-19: qty 5

## 2019-05-19 MED ORDER — SODIUM CHLORIDE 0.9% FLUSH
10.0000 mL | Freq: Once | INTRAVENOUS | Status: AC
  Administered 2019-05-19: 10 mL via INTRAVENOUS
  Filled 2019-05-19: qty 10

## 2019-05-19 NOTE — Progress Notes (Signed)
Hematology/Oncology Consult note [facility]  Telephone:([phone] [phone]  Patient Care Team: Kendell Bane, NP as PCP - General (Adult Health Nurse Practitioner) Clent Jacks, RN as Oncology Nurse Navigator   Name of the patient: Keith Trujillo  [id number]  1961/01/01   Date of visit: 05/19/19  Diagnosis-  stage IIIb colon adenocarcinoma  Chief complaint/ Reason for visit-on treatment assessment prior to cycle 5 of adjuvant FOLFOX chemotherapy  Heme/Onc history: patient is a 59 year old male who was seen by Dr. Allen Norris from GI for symptoms of diarrhea as well as abdominal pain.He underwent a colonoscopy on 01/02/2019 which showed an ulcerated partially obstructing large mass in the sigmoid colon which measured 10 cm in length biopsy showed adenocarcinoma moderately differentiated. Baseline CEA elevated at 71.7. He was seen by Dr. Dahlia Byes and will be undergoing laparoscopic hemicolectomy and lymph node sampling next week.  CT abdomen pelvis with contrast showed irregular marked circumferential wall thickening in the sigmoid colon near the rectosigmoid junction. The lesion longitudinally extends for approximately 5 to 6 cm. 4.1 x 2.8 cm ill-defined irregular paracolonic soft tissue mass may represent direct tumor extension or contiguous markedly enlarged lymph node. Abnormal lymph nodes also seen in the perirectal and presacral space concerning for metastatic disease. 1.5 cm ill-defined hypoattenuating lesion in the medial segment of the left liver which may be focal fatty deposition but cannot be definitively characterized.MRI did not show any evidence of liver metastases. CT chest was negative for metastatic disease.  Patient underwent a low anterior resection of the sigmoid mass. Final pathology showed 8 cm invasive colorectal adenocarcinoma grade 2. Tumor invades through the muscularis propria into pericolorectal tissue. All margins  are uninvolvedLymphovascular invasion and perineural invasion present. Lymph nodes 1+ out of 20 PT3PN1A. MSI stable  Adjuvant FOLFOX chemotherapy was started on 03/17/2019  Interval history-he has intermittent diarrhea since his ileostomy for which he uses as needed Imodium.  Reports some persistent tingling numbness in his hands and feet.  ECOG PS- 1 Pain scale- 0 Opioid associated constipation- no  Review of systems- Review of Systems  Constitutional: Negative for chills, fever, malaise/fatigue and weight loss.  HENT: Negative for congestion, ear discharge and nosebleeds.   Eyes: Negative for blurred vision.  Respiratory: Negative for cough, hemoptysis, sputum production, shortness of breath and wheezing.   Cardiovascular: Negative for chest pain, palpitations, orthopnea and claudication.  Gastrointestinal: Negative for abdominal pain, blood in stool, constipation, diarrhea, heartburn, melena, nausea and vomiting.  Genitourinary: Negative for dysuria, flank pain, frequency, hematuria and urgency.  Musculoskeletal: Negative for back pain, joint pain and myalgias.  Skin: Negative for rash.  Neurological: Positive for sensory change (Peripheral neuropathy). Negative for dizziness, tingling, focal weakness, seizures, weakness and headaches.  Endo/Heme/Allergies: Does not bruise/bleed easily.  Psychiatric/Behavioral: Negative for depression and suicidal ideas. The patient does not have insomnia.       Allergies  Allergen Reactions  . Sulfa Antibiotics Rash     Past Medical History:  Diagnosis Date  . Abdominal pain   . Allergy   . Cancer ([place])   . Cellulitis   . Colon cancer ([place])   . Dyspnea   . Liver spot   . Loose stools   . Weight loss      Past Surgical History:  Procedure Laterality Date  . COLONOSCOPY WITH PROPOFOL N/A 01/02/2019   Procedure: COLONOSCOPY WITH BIOPSY;  Surgeon: [name], MD;  Location: [place];  Service: Endoscopy;  Laterality:  N/A;  Clip x2 at Biopsy Site  . COLOSTOMY Right 02/06/2019   Procedure: COLOSTOMY;  Surgeon: [name], MD;  Location: [facility];  Service: General;  Laterality: Right;  . excision of skin lesion    . LAPAROSCOPIC SIGMOID COLECTOMY N/A 02/06/2019   Procedure: LAPAROSCOPIC SIGMOID COLECTOMY converted to open procedure;  Surgeon: [name], MD;  Location: [facility];  Service: General;  Laterality: N/A;  . PORTACATH PLACEMENT Right 03/06/2019   Procedure: INSERTION PORT-A-CATH;  Surgeon: [name], MD;  Location: [facility];  Service: General;  Laterality: Right;  . TONSILLECTOMY AND ADENOIDECTOMY  1969    Social History   Socioeconomic History  . Marital status: Married    Spouse name: Not on file  . Number of children: Not on file  . Years of education: Not on file  . Highest education level: Not on file  Occupational History  . Not on file  Tobacco Use  . Smoking status: Light Tobacco Smoker    Types: Cigars  . Smokeless tobacco: Former Systems developer    Quit date: 1980  Substance and Sexual Activity  . Alcohol use: Yes    Comment: rare  . Drug use: Never  . Sexual activity: Yes  Other Topics Concern  . Not on file  Social History Narrative  . Not on file   Social Determinants of Health   Financial Resource Strain:   . Difficulty of Paying Living Expenses: Not on file  Food Insecurity:   . Worried About Charity fundraiser in the Last Year: Not on file  . Ran Out of Food in the Last Year: Not on file  Transportation Needs:   . Lack of Transportation (Medical): Not on file  . Lack of Transportation (Non-Medical): Not on file  Physical Activity:   . Days of Exercise per Week: Not on file  . Minutes of Exercise per Session: Not on file  Stress:   . Feeling of Stress : Not on file  Social Connections:   . Frequency of Communication with Friends and Family: Not on file  . Frequency of Social Gatherings with Friends and Family: Not on file  . Attends Religious  Services: Not on file  . Active Member of Clubs or Organizations: Not on file  . Attends Archivist Meetings: Not on file  . Marital Status: Not on file  Intimate Partner Violence:   . Fear of Current or Ex-Partner: Not on file  . Emotionally Abused: Not on file  . Physically Abused: Not on file  . Sexually Abused: Not on file    Family History  Problem Relation Age of Onset  . Lung cancer Father      Current Outpatient Medications:  .  dexamethasone (DECADRON) 4 MG tablet, Take 2 tablets (8 mg total) by mouth daily. Start the day after chemotherapy for 2 days. Take with food., Disp: 30 tablet, Rfl: 1 .  lidocaine-prilocaine (EMLA) cream, Apply to affected area once, Disp: 30 g, Rfl: 3 .  Loperamide HCl (IMODIUM PO), Take 2 mg by mouth 2 (two) times daily as needed (diarrhea or loose stools). , Disp: , Rfl:  .  loratadine (CLARITIN) 10 MG tablet, Take 10 mg by mouth daily. Just around chemo for 4 days, Disp: , Rfl:  .  ondansetron (ZOFRAN) 8 MG tablet, Take 1 tablet (8 mg total) by mouth 2 (two) times daily as needed for refractory nausea / vomiting. Start on day 3 after chemotherapy. (Patient not  taking: Reported on 05/19/2019), Disp: 30 tablet, Rfl: 1 .  prochlorperazine (COMPAZINE) 10 MG tablet, Take 1 tablet (10 mg total) by mouth every 6 (six) hours as needed (Nausea or vomiting). (Patient not taking: Reported on 05/19/2019), Disp: 30 tablet, Rfl: 1  Physical exam:  Vitals:   05/19/19 0848  BP: 117/73  Pulse: 69  Resp: 16  Temp: (!) 96.7 F (35.9 C)  TempSrc: Tympanic  Weight: 187 lb 11.2 oz (85.1 kg)   Physical Exam Constitutional:      General: He is not in acute distress. HENT:     Head: Normocephalic and atraumatic.  Eyes:     Pupils: Pupils are equal, round, and reactive to light.  Cardiovascular:     Rate and Rhythm: Normal rate and regular rhythm.     Heart sounds: Normal heart sounds.  Pulmonary:     Effort: Pulmonary effort is normal.     Breath  sounds: Normal breath sounds.  Abdominal:     General: Bowel sounds are normal.     Palpations: Abdomen is soft.     Comments: Ileostomy in place  Musculoskeletal:     Cervical back: Normal range of motion.  Skin:    General: Skin is warm and dry.  Neurological:     Mental Status: He is alert and oriented to person, place, and time.      CMP Latest Ref Rng & Units 05/19/2019  Glucose 70 - 99 mg/dL 104(H)  BUN 6 - 20 mg/dL 17  Creatinine 0.61 - 1.24 mg/dL 0.91  Sodium 135 - 145 mmol/L 138  Potassium 3.5 - 5.1 mmol/L 4.1  Chloride 98 - 111 mmol/L 106  CO2 22 - 32 mmol/L 24  Calcium 8.9 - 10.3 mg/dL 9.0  Total Protein 6.5 - 8.1 g/dL 6.7  Total Bilirubin 0.3 - 1.2 mg/dL 0.4  Alkaline Phos 38 - 126 U/L 119  AST 15 - 41 U/L 25  ALT 0 - 44 U/L 35   CBC Latest Ref Rng & Units 05/19/2019  WBC 4.0 - 10.5 K/uL 12.4(H)  Hemoglobin 13.0 - 17.0 g/dL 12.0(L)  Hematocrit 39.0 - 52.0 % 38.3(L)  Platelets 150 - 400 K/uL 72(L)      Assessment and plan- Patient is a 59 y.o. 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 He is here for on treatment assessment prior to cycle 5 of adjuvant FOLFOX chemotherapy  Despite dropping 5-FU bolus and reducing oxaliplatin dose to 65 mg per metered squared patient's platelet count is 72 today.  I will therefore hold his chemotherapy today.  He will directly proceed for cycle 5 of adjuvant FU chemotherapy next week.  He will not be receiving 5-FU bolus on oxaliplatin and will only receive infusional 5-FU chemotherapy.  I will see him back 3 weeks from now for cycle 6 and depending on his platelet counts I will decide if he can get oxaliplatin or not.  I will see if I can give him oxaliplatin every other cycle if his platelet counts permit.  Chemo-induced peripheral neuropathy: Mild grade 1.  He will not be getting oxaliplatin for the next cycle.  Continue to monitor   Visit Diagnosis 1. Encounter for antineoplastic  chemotherapy   2. Chemotherapy-induced thrombocytopenia   3. Cancer of sigmoid [facility])      Dr. Randa Evens, MD, MPH [facility] at [facility] [phone] 05/19/2019 4:21 PM

## 2019-05-19 NOTE — Progress Notes (Signed)
Pt states appetite good, sometimes he can gavin 8-10 pounds the time he is away from clinic. He is having 3-4 imodium a day trying to get paste like stools per dr pabon. He is having numbing with fingers and palms. And above ankle and entire feet is stinging sensation

## 2019-05-21 ENCOUNTER — Inpatient Hospital Stay: Payer: No Typology Code available for payment source

## 2019-05-26 ENCOUNTER — Inpatient Hospital Stay: Payer: No Typology Code available for payment source

## 2019-05-26 ENCOUNTER — Other Ambulatory Visit: Payer: Self-pay

## 2019-05-26 ENCOUNTER — Ambulatory Visit: Payer: 59 | Admitting: Oncology

## 2019-05-26 VITALS — BP 127/75 | HR 75 | Temp 96.9°F | Resp 18 | Wt 193.6 lb

## 2019-05-26 DIAGNOSIS — C187 Malignant neoplasm of sigmoid colon: Secondary | ICD-10-CM

## 2019-05-26 DIAGNOSIS — Z95828 Presence of other vascular implants and grafts: Secondary | ICD-10-CM

## 2019-05-26 DIAGNOSIS — Z5111 Encounter for antineoplastic chemotherapy: Secondary | ICD-10-CM | POA: Diagnosis not present

## 2019-05-26 LAB — COMPREHENSIVE METABOLIC PANEL
ALT: 25 U/L (ref 0–44)
AST: 19 U/L (ref 15–41)
Albumin: 4 g/dL (ref 3.5–5.0)
Alkaline Phosphatase: 86 U/L (ref 38–126)
Anion gap: 7 (ref 5–15)
BUN: 16 mg/dL (ref 6–20)
CO2: 24 mmol/L (ref 22–32)
Calcium: 8.7 mg/dL — ABNORMAL LOW (ref 8.9–10.3)
Chloride: 105 mmol/L (ref 98–111)
Creatinine, Ser: 0.87 mg/dL (ref 0.61–1.24)
GFR calc Af Amer: >60 mL/min (ref >60)
GFR calc non Af Amer: >60 mL/min (ref >60)
Glucose, Bld: 102 mg/dL — ABNORMAL HIGH (ref 70–99)
Potassium: 3.9 mmol/L (ref 3.5–5.1)
Sodium: 136 mmol/L (ref 135–145)
Total Bilirubin: 0.7 mg/dL (ref 0.3–1.2)
Total Protein: 6.3 g/dL — ABNORMAL LOW (ref 6.5–8.1)

## 2019-05-26 LAB — CBC WITH DIFFERENTIAL/PLATELET
Abs Immature Granulocytes: 0.04 10*3/uL (ref 0.00–0.07)
Basophils Absolute: 0 10*3/uL (ref 0.0–0.1)
Basophils Relative: 1 %
Eosinophils Absolute: 0.1 10*3/uL (ref 0.0–0.5)
Eosinophils Relative: 1 %
HCT: 36.3 % — ABNORMAL LOW (ref 39.0–52.0)
Hemoglobin: 11.5 g/dL — ABNORMAL LOW (ref 13.0–17.0)
Immature Granulocytes: 1 %
Lymphocytes Relative: 11 %
Lymphs Abs: 0.9 10*3/uL (ref 0.7–4.0)
MCH: 30.6 pg (ref 26.0–34.0)
MCHC: 31.7 g/dL (ref 30.0–36.0)
MCV: 96.5 fL (ref 80.0–100.0)
Monocytes Absolute: 0.5 10*3/uL (ref 0.1–1.0)
Monocytes Relative: 6 %
Neutro Abs: 6.7 10*3/uL (ref 1.7–7.7)
Neutrophils Relative %: 80 %
Platelets: 144 10*3/uL — ABNORMAL LOW (ref 150–400)
RBC: 3.76 MIL/uL — ABNORMAL LOW (ref 4.22–5.81)
RDW: 15.1 % (ref 11.5–15.5)
WBC: 8.3 10*3/uL (ref 4.0–10.5)
nRBC: 0 % (ref 0.0–0.2)

## 2019-05-26 MED ORDER — SODIUM CHLORIDE 0.9% FLUSH
10.0000 mL | Freq: Once | INTRAVENOUS | Status: AC
  Administered 2019-05-26: 10 mL via INTRAVENOUS
  Filled 2019-05-26: qty 10

## 2019-05-26 MED ORDER — PALONOSETRON HCL INJECTION 0.25 MG/5ML: 0.2500 mg | Freq: Once | INTRAVENOUS | Status: DC

## 2019-05-26 MED ORDER — SODIUM CHLORIDE 0.9 % IV SOLN
2400.0000 mg/m2 | INTRAVENOUS | Status: DC
  Administered 2019-05-26: 12:00:00 5200 mg via INTRAVENOUS
  Filled 2019-05-26: qty 100

## 2019-05-26 MED ORDER — SODIUM CHLORIDE 0.9 % IV SOLN: 10.0000 mg | Freq: Once | INTRAVENOUS | Status: DC

## 2019-05-26 MED ORDER — LEUCOVORIN CALCIUM INJECTION 350 MG
393.5000 mg/m2 | Freq: Once | INTRAVENOUS | Status: AC
  Administered 2019-05-26: 850 mg via INTRAVENOUS
  Filled 2019-05-26: qty 25

## 2019-05-26 MED ORDER — SODIUM CHLORIDE 0.9 % IV SOLN
Freq: Once | INTRAVENOUS | Status: AC
  Filled 2019-05-26: qty 250

## 2019-05-26 MED ORDER — LEUCOVORIN CALCIUM INJECTION 350 MG: 400.0000 mg/m2 | Freq: Once | INTRAVENOUS | Status: DC

## 2019-05-28 ENCOUNTER — Inpatient Hospital Stay: Payer: No Typology Code available for payment source

## 2019-05-28 ENCOUNTER — Other Ambulatory Visit: Payer: Self-pay

## 2019-05-28 VITALS — BP 140/70 | HR 76 | Resp 19

## 2019-05-28 DIAGNOSIS — Z5111 Encounter for antineoplastic chemotherapy: Secondary | ICD-10-CM | POA: Diagnosis not present

## 2019-05-28 DIAGNOSIS — C187 Malignant neoplasm of sigmoid colon: Secondary | ICD-10-CM

## 2019-05-28 MED ORDER — SODIUM CHLORIDE 0.9% FLUSH
10.0000 mL | INTRAVENOUS | Status: DC | PRN
  Administered 2019-05-28: 10 mL
  Filled 2019-05-28: qty 10

## 2019-05-28 MED ORDER — PEGFILGRASTIM-CBQV 6 MG/0.6ML ~~LOC~~ SOSY
6.0000 mg | PREFILLED_SYRINGE | Freq: Once | SUBCUTANEOUS | Status: AC
  Administered 2019-05-28: 6 mg via SUBCUTANEOUS
  Filled 2019-05-28: qty 0.6

## 2019-05-28 MED ORDER — HEPARIN SOD (PORK) LOCK FLUSH 100 UNIT/ML IV SOLN
500.0000 [IU] | Freq: Once | INTRAVENOUS | Status: AC | PRN
  Administered 2019-05-28: 500 [IU]
  Filled 2019-05-28: qty 5

## 2019-05-28 MED ORDER — HEPARIN SOD (PORK) LOCK FLUSH 100 UNIT/ML IV SOLN
INTRAVENOUS | Status: AC
  Filled 2019-05-28: qty 5

## 2019-06-03 ENCOUNTER — Telehealth: Payer: Self-pay | Admitting: Emergency Medicine

## 2019-06-03 NOTE — Telephone Encounter (Signed)
Pt is to have Barium Enema tomorrow 3/17 and is to f/u with Dr Dahlia Byes 2 months after 05/07/19 office visit.   Pt scheduled for follow up with Dr Dahlia Byes on 07/07/19 at 1015am.   Called pt to make him aware of f/u. No answer Left vm to call back to the office

## 2019-06-03 NOTE — Telephone Encounter (Signed)
Pt made aware of appt and voiced understanding.

## 2019-06-04 ENCOUNTER — Ambulatory Visit
Admission: RE | Admit: 2019-06-04 | Discharge: 2019-06-04 | Disposition: A | Discharge status: Home / Self Care | Payer: 59 | Source: Ambulatory Visit | Attending: Surgery | Admitting: Surgery

## 2019-06-04 ENCOUNTER — Other Ambulatory Visit: Payer: Self-pay

## 2019-06-04 ENCOUNTER — Telehealth: Payer: Self-pay

## 2019-06-04 DIAGNOSIS — Z09 Encounter for follow-up examination after completed treatment for conditions other than malignant neoplasm: Secondary | ICD-10-CM | POA: Diagnosis not present

## 2019-06-04 NOTE — Telephone Encounter (Signed)
Patient was notified of recent xray results per Dr.Pabon. Patient notified and also informed him that Ginger from Gastroenterology would be contacting him to get him scheduled for the procedure. Patient verbalized understanding and has no further questions.

## 2019-06-04 NOTE — Telephone Encounter (Signed)
-----   Message from [name], MD sent at 06/04/2019  3:26 PM EDT ----- Please let the pt know that his Xray Showed a narrowing and I would like Dr. Allen Norris to arrange a Flex sigmoidoscopy to assess the area. It does not look like cancer but some scar tissue. Please make arrangement for Dr. Allen Norris to do flex sigmoidoscopy.  ----- Message ----- From: Interface, Rad Results In Sent: 06/04/2019   3:01 PM EDT To: [name], MD

## 2019-06-05 ENCOUNTER — Telehealth: Payer: Self-pay

## 2019-06-05 NOTE — Telephone Encounter (Signed)
LVM for pt to return my call to schedule flex sigmoidoscopy.

## 2019-06-06 ENCOUNTER — Other Ambulatory Visit: Payer: Self-pay

## 2019-06-06 NOTE — Progress Notes (Signed)
Called patient no answer left message  

## 2019-06-09 ENCOUNTER — Encounter: Payer: Self-pay | Admitting: Oncology

## 2019-06-09 ENCOUNTER — Inpatient Hospital Stay: Payer: No Typology Code available for payment source

## 2019-06-09 ENCOUNTER — Other Ambulatory Visit: Payer: Self-pay

## 2019-06-09 ENCOUNTER — Inpatient Hospital Stay (HOSPITAL_BASED_OUTPATIENT_CLINIC_OR_DEPARTMENT_OTHER): Payer: No Typology Code available for payment source | Admitting: Oncology

## 2019-06-09 VITALS — BP 132/65 | HR 65 | Temp 96.0°F | Resp 16 | Wt 193.8 lb

## 2019-06-09 DIAGNOSIS — C187 Malignant neoplasm of sigmoid colon: Secondary | ICD-10-CM

## 2019-06-09 DIAGNOSIS — Z5111 Encounter for antineoplastic chemotherapy: Secondary | ICD-10-CM

## 2019-06-09 DIAGNOSIS — D6959 Other secondary thrombocytopenia: Secondary | ICD-10-CM

## 2019-06-09 DIAGNOSIS — T451X5A Adverse effect of antineoplastic and immunosuppressive drugs, initial encounter: Secondary | ICD-10-CM | POA: Diagnosis not present

## 2019-06-09 LAB — CBC WITH DIFFERENTIAL/PLATELET
Abs Immature Granulocytes: 0.09 10*3/uL — ABNORMAL HIGH (ref 0.00–0.07)
Basophils Absolute: 0 10*3/uL (ref 0.0–0.1)
Basophils Relative: 0 %
Eosinophils Absolute: 0.1 10*3/uL (ref 0.0–0.5)
Eosinophils Relative: 1 %
HCT: 35.4 % — ABNORMAL LOW (ref 39.0–52.0)
Hemoglobin: 11.6 g/dL — ABNORMAL LOW (ref 13.0–17.0)
Immature Granulocytes: 1 %
Lymphocytes Relative: 9 %
Lymphs Abs: 1.1 10*3/uL (ref 0.7–4.0)
MCH: 31.3 pg (ref 26.0–34.0)
MCHC: 32.8 g/dL (ref 30.0–36.0)
MCV: 95.4 fL (ref 80.0–100.0)
Monocytes Absolute: 0.7 10*3/uL (ref 0.1–1.0)
Monocytes Relative: 6 %
Neutro Abs: 10.4 10*3/uL — ABNORMAL HIGH (ref 1.7–7.7)
Neutrophils Relative %: 83 %
Platelets: 97 10*3/uL — ABNORMAL LOW (ref 150–400)
RBC: 3.71 MIL/uL — ABNORMAL LOW (ref 4.22–5.81)
RDW: 15.2 % (ref 11.5–15.5)
WBC: 12.4 10*3/uL — ABNORMAL HIGH (ref 4.0–10.5)
nRBC: 0 % (ref 0.0–0.2)

## 2019-06-09 LAB — COMPREHENSIVE METABOLIC PANEL
ALT: 21 U/L (ref 0–44)
AST: 18 U/L (ref 15–41)
Albumin: 4 g/dL (ref 3.5–5.0)
Alkaline Phosphatase: 120 U/L (ref 38–126)
Anion gap: 7 (ref 5–15)
BUN: 13 mg/dL (ref 6–20)
CO2: 24 mmol/L (ref 22–32)
Calcium: 8.7 mg/dL — ABNORMAL LOW (ref 8.9–10.3)
Chloride: 107 mmol/L (ref 98–111)
Creatinine, Ser: 0.9 mg/dL (ref 0.61–1.24)
GFR calc Af Amer: >60 mL/min (ref >60)
GFR calc non Af Amer: >60 mL/min (ref >60)
Glucose, Bld: 99 mg/dL (ref 70–99)
Potassium: 3.8 mmol/L (ref 3.5–5.1)
Sodium: 138 mmol/L (ref 135–145)
Total Bilirubin: 0.5 mg/dL (ref 0.3–1.2)
Total Protein: 6.3 g/dL — ABNORMAL LOW (ref 6.5–8.1)

## 2019-06-09 MED ORDER — DEXTROSE 5 % IV SOLN
Freq: Once | INTRAVENOUS | Status: AC
  Filled 2019-06-09: qty 250

## 2019-06-09 MED ORDER — SODIUM CHLORIDE 0.9 % IV SOLN
10.0000 mg | Freq: Once | INTRAVENOUS | Status: AC
  Administered 2019-06-09: 10 mg via INTRAVENOUS
  Filled 2019-06-09: qty 10

## 2019-06-09 MED ORDER — PALONOSETRON HCL INJECTION 0.25 MG/5ML: 0.2500 mg | Freq: Once | INTRAVENOUS | Status: DC

## 2019-06-09 MED ORDER — SODIUM CHLORIDE 0.9% FLUSH
10.0000 mL | Freq: Once | INTRAVENOUS | Status: AC
  Administered 2019-06-09: 10 mL via INTRAVENOUS
  Filled 2019-06-09: qty 10

## 2019-06-09 MED ORDER — SODIUM CHLORIDE 0.9 % IV SOLN
2400.0000 mg/m2 | INTRAVENOUS | Status: DC
  Administered 2019-06-09: 12:00:00 5200 mg via INTRAVENOUS
  Filled 2019-06-09: qty 50

## 2019-06-09 MED ORDER — LEUCOVORIN CALCIUM INJECTION 350 MG
850.0000 mg | Freq: Once | INTRAVENOUS | Status: AC
  Administered 2019-06-09: 11:00:00 850 mg via INTRAVENOUS
  Filled 2019-06-09: qty 25

## 2019-06-09 NOTE — Progress Notes (Signed)
Pt doing good, plt 97 today and hoping to get treatment.

## 2019-06-10 ENCOUNTER — Telehealth: Payer: Self-pay | Admitting: Gastroenterology

## 2019-06-10 NOTE — Progress Notes (Signed)
Hematology/Oncology Consult note [facility]  [phone] [phone]  Patient Care Team: Kendell Bane, NP as PCP - General (Adult Health Nurse Practitioner) Clent Jacks, RN as Oncology Nurse Navigator   Name of the patient: Keith Trujillo  [id number]  09-14-1960   Date of visit: 06/10/19  Diagnosis- stage IIIb colon adenocarcinoma  Chief complaint/ Reason for visit-on treatment assessment prior to cycle 6 of adjuvant FOLFOX chemotherapy  Heme/Onc history: patient is a 59 year old male who was seen by Dr. Remi Haggard GI for symptoms of diarrhea as well as abdominal pain.He underwent a colonoscopy on 01/02/2019 which showed an ulcerated partially obstructing large mass in the sigmoid colon which measured 10 cm in length biopsy showed adenocarcinoma moderately differentiated. Baseline CEA elevated at 71.7. He was seen by Dr. Dahlia Byes and will be undergoing laparoscopic hemicolectomy and lymph node sampling next week.  CT abdomen pelvis with contrast showed irregular marked circumferential wall thickening in the sigmoid colon near the rectosigmoid junction. The lesion longitudinally extends for approximately 5 to 6 cm. 4.1 x 2.8 cm ill-defined irregular paracolonic soft tissue mass may represent direct tumor extension or contiguous markedly enlarged lymph node. Abnormal lymph nodes also seen in the perirectal and presacral space concerning for metastatic disease. 1.5 cm ill-defined hypoattenuating lesion in the medial segment of the left liver which may be focal fatty deposition but cannot be definitively characterized.MRI did not show any evidence of liver metastases. CT chest was negative for metastatic disease.  Patient underwent a low anterior resection of the sigmoid mass. Final pathology showed 8 cm invasive colorectal adenocarcinoma grade 2. Tumor invades through the muscularis propria into pericolorectal tissue. All margins are  uninvolvedLymphovascular invasion and perineural invasion present. Lymph nodes 1+ out of 20 PT3PN1A. MSI stable  Adjuvant FOLFOX chemotherapy was started on 03/17/2019   Interval history-feels well.  Denies any complaints at this time.  Appetite and weight are stable.  Peripheral neuropathy is improving.  ECOG PS- 1 Pain scale- 0   Review of systems- Review of Systems  Constitutional: Positive for malaise/fatigue. Negative for chills, fever and weight loss.  HENT: Negative for congestion, ear discharge and nosebleeds.   Eyes: Negative for blurred vision.  Respiratory: Negative for cough, hemoptysis, sputum production, shortness of breath and wheezing.   Cardiovascular: Negative for chest pain, palpitations, orthopnea and claudication.  Gastrointestinal: Negative for abdominal pain, blood in stool, constipation, diarrhea, heartburn, melena, nausea and vomiting.  Genitourinary: Negative for dysuria, flank pain, frequency, hematuria and urgency.  Musculoskeletal: Negative for back pain, joint pain and myalgias.  Skin: Negative for rash.  Neurological: Negative for dizziness, tingling, focal weakness, seizures, weakness and headaches.  Endo/Heme/Allergies: Does not bruise/bleed easily.  Psychiatric/Behavioral: Negative for depression and suicidal ideas. The patient does not have insomnia.       Allergies  Allergen Reactions   Sulfa Antibiotics Rash     Past Medical History:  Diagnosis Date   Abdominal pain    Allergy    Cancer ([place])    Cellulitis    Colon cancer (HCC)    Dyspnea    Liver spot    Loose stools    Weight loss      Past Surgical History:  Procedure Laterality Date   COLONOSCOPY WITH PROPOFOL N/A 01/02/2019   Procedure: COLONOSCOPY WITH BIOPSY;  Surgeon: [name], MD;  Location: [place];  Service: Endoscopy;  Laterality: N/A;  Clip x2 at Biopsy Site   COLOSTOMY Right 02/06/2019  Procedure: COLOSTOMY;  Surgeon: [name], MD;  Location: [facility];  Service: General;  Laterality: Right;   excision of skin lesion     LAPAROSCOPIC SIGMOID COLECTOMY N/A 02/06/2019   Procedure: LAPAROSCOPIC SIGMOID COLECTOMY converted to open procedure;  Surgeon: [name], MD;  Location: [facility];  Service: General;  Laterality: N/A;   PORTACATH PLACEMENT Right 03/06/2019   Procedure: INSERTION PORT-A-CATH;  Surgeon: [name], MD;  Location: [facility];  Service: General;  Laterality: Right;   TONSILLECTOMY AND ADENOIDECTOMY  1969    Social History   Socioeconomic History   Marital status: Married    Spouse name: Not on file   Number of children: Not on file   Years of education: Not on file   Highest education level: Not on file  Occupational History   Not on file  Tobacco Use   Smoking status: Light Tobacco Smoker    Types: Cigars   Smokeless tobacco: Former Systems developer    Quit date: 1980  Substance and Sexual Activity   Alcohol use: Yes    Comment: rare   Drug use: Never   Sexual activity: Yes  Other Topics Concern   Not on file  Social History Narrative   Not on file   Social Determinants of Health   Financial Resource Strain:    Difficulty of Paying Living Expenses:   Food Insecurity:    Worried About Charity fundraiser in the Last Year:    Arboriculturist in the Last Year:   Transportation Needs:    Film/video editor (Medical):    Lack of Transportation (Non-Medical):   Physical Activity:    Days of Exercise per Week:    Minutes of Exercise per Session:   Stress:    Feeling of Stress :   Social Connections:    Frequency of Communication with Friends and Family:    Frequency of Social Gatherings with Friends and Family:    Attends Religious Services:    Active Member of Clubs or Organizations:    Attends Music therapist:    Marital Status:   Intimate Partner Violence:    Fear of Current or Ex-Partner:    Emotionally Abused:     Physically Abused:    Sexually Abused:     Family History  Problem Relation Age of Onset   Lung cancer Father      Current Outpatient Medications:    dexamethasone (DECADRON) 4 MG tablet, Take 2 tablets (8 mg total) by mouth daily. Start the day after chemotherapy for 2 days. Take with food., Disp: 30 tablet, Rfl: 1   lidocaine-prilocaine (EMLA) cream, Apply to affected area once, Disp: 30 g, Rfl: 3   Loperamide HCl (IMODIUM PO), Take 2 mg by mouth 2 (two) times daily as needed (diarrhea or loose stools). , Disp: , Rfl:    loratadine (CLARITIN) 10 MG tablet, Take 10 mg by mouth daily. Just around chemo for 4 days, Disp: , Rfl:    ondansetron (ZOFRAN) 8 MG tablet, Take 1 tablet (8 mg total) by mouth 2 (two) times daily as needed for refractory nausea / vomiting. Start on day 3 after chemotherapy. (Patient not taking: Reported on 05/19/2019), Disp: 30 tablet, Rfl: 1   prochlorperazine (COMPAZINE) 10 MG tablet, Take 1 tablet (10 mg total) by mouth every 6 (six) hours as needed (Nausea or vomiting). (Patient not taking: Reported on 05/19/2019), Disp: 30 tablet, Rfl: 1  Physical exam:  Vitals:  06/09/19 0853  BP: 132/65  Pulse: 65  Resp: 16  Temp: (!) 96 F (35.6 C)  TempSrc: Tympanic  Weight: 193 lb 12.8 oz (87.9 kg)   Physical Exam Cardiovascular:     Rate and Rhythm: Normal rate and regular rhythm.     Heart sounds: Normal heart sounds.  Pulmonary:     Effort: Pulmonary effort is normal.     Breath sounds: Normal breath sounds.  Abdominal:     General: Bowel sounds are normal.     Palpations: Abdomen is soft.  Skin:    General: Skin is warm and dry.  Neurological:     Mental Status: He is alert and oriented to person, place, and time.      CMP Latest Ref Rng & Units 06/09/2019  Glucose 70 - 99 mg/dL 99  BUN 6 - 20 mg/dL 13  Creatinine 0.61 - 1.24 mg/dL 0.90  Sodium 135 - 145 mmol/L 138  Potassium 3.5 - 5.1 mmol/L 3.8  Chloride 98 - 111 mmol/L 107  CO2 22 - 32  mmol/L 24  Calcium 8.9 - 10.3 mg/dL 8.7(L)  Total Protein 6.5 - 8.1 g/dL 6.3(L)  Total Bilirubin 0.3 - 1.2 mg/dL 0.5  Alkaline Phos 38 - 126 U/L 120  AST 15 - 41 U/L 18  ALT 0 - 44 U/L 21   CBC Latest Ref Rng & Units 06/09/2019  WBC 4.0 - 10.5 K/uL 12.4(H)  Hemoglobin 13.0 - 17.0 g/dL 11.6(L)  Hematocrit 39.0 - 52.0 % 35.4(L)  Platelets 150 - 400 K/uL 97(L)    No images are attached to the encounter.  DG BE (COLON)W SINGLE CM (SOL OR THIN BA)  Result Date: 06/04/2019 CLINICAL DATA:  Status post sigmoid colectomy. History of colon cancer. EXAM: SINGLE COLUMN BARIUM ENEMA TECHNIQUE: Initial scout AP supine abdominal image obtained to insure adequate colon cleansing. Barium was introduced into the colon in a retrograde fashion and refluxed from the rectum to the cecum. Spot images of the colon followed by overhead radiographs were obtained. FLUOROSCOPY TIME:  Fluoroscopy Time:  1 minutes 48 seconds Radiation Exposure Index (if provided by the fluoroscopic device): 80.3 mGy Number of Acquired Spot Images: 0 COMPARISON:  None. FINDINGS: An enema catheter was inserted and retention balloon distended under fluoroscopy. Barium was then introduced. Examination was negative for filling defects to indicate the presence of a polyp or large mass lesion. Focal narrowing of the rectosigmoid colon at the surgical site which may reflect a focal area of scarring and stricture versus less likely under distension as this area persists throughout the examination with normal distension of the colon both proximal and distal to this short-segment. Contrast flows into the more proximal sigmoid colon into the descending colon, transverse colon and into the ascending colon without delay. No large annular constricting lesion. No extraluminal contrast. IMPRESSION: Focal narrowing of the rectosigmoid colon at the surgical site which may reflect a focal area of scarring and stricture as this area persists throughout the  examination with normal distension of the colon both proximal and distal to this short-segment. Electronically Signed   By: Kathreen Devoid   On: 06/04/2019 14:58     Assessment and plan- Patient is a 59 y.o. 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 He is here for on treatment assessment prior to cycle 6 of adjuvant FOLFOX chemotherapy  2 weeks ago patient did not receive 5-FU bolus or oxaliplatin.  He only received infusional 5-FU chemotherapy and his  platelet count at that time was 144.  Today his platelet count is down to 97.  I will therefore continue to hold off on giving him oxaliplatin today and he will only receive infusional 5-FU chemotherapy.  I will see him back in 2 weeks time with CBC with differential, CMP for cycle 7 of adjuvant FOLFOX chemotherapy but will give him oxaliplatin if his platelet counts are more than 100.  Chemo-induced peripheral neuropathy: Currently stable continue to monitor   Visit Diagnosis 1. Encounter for antineoplastic chemotherapy   2. Chemotherapy-induced thrombocytopenia   3. Cancer of sigmoid [facility])      Dr. Randa Evens, MD, MPH [facility] at [facility] [phone] 06/10/2019 12:11 PM

## 2019-06-10 NOTE — Telephone Encounter (Signed)
Patnt called & would like Ginger to call him. He is returning her call.

## 2019-06-11 ENCOUNTER — Inpatient Hospital Stay: Payer: No Typology Code available for payment source

## 2019-06-11 ENCOUNTER — Other Ambulatory Visit: Payer: Self-pay

## 2019-06-11 VITALS — BP 111/53 | HR 58 | Resp 18

## 2019-06-11 DIAGNOSIS — C187 Malignant neoplasm of sigmoid colon: Secondary | ICD-10-CM

## 2019-06-11 DIAGNOSIS — Z5111 Encounter for antineoplastic chemotherapy: Secondary | ICD-10-CM | POA: Diagnosis not present

## 2019-06-11 MED ORDER — HEPARIN SOD (PORK) LOCK FLUSH 100 UNIT/ML IV SOLN
500.0000 [IU] | Freq: Once | INTRAVENOUS | Status: AC | PRN
  Administered 2019-06-11: 500 [IU]
  Filled 2019-06-11: qty 5

## 2019-06-11 MED ORDER — PEGFILGRASTIM-CBQV 6 MG/0.6ML ~~LOC~~ SOSY
6.0000 mg | PREFILLED_SYRINGE | Freq: Once | SUBCUTANEOUS | Status: AC
  Administered 2019-06-11: 15:00:00 6 mg via SUBCUTANEOUS
  Filled 2019-06-11: qty 0.6

## 2019-06-13 ENCOUNTER — Other Ambulatory Visit: Payer: Self-pay

## 2019-06-13 DIAGNOSIS — C187 Malignant neoplasm of sigmoid colon: Secondary | ICD-10-CM

## 2019-06-13 NOTE — Progress Notes (Signed)
Pharmacist Chemotherapy Monitoring - Follow Up Assessment    I verify that I have reviewed each item in the below checklist:  . Regimen for the patient is scheduled for the appropriate day and plan matches scheduled date. . Appropriate non-routine labs are ordered dependent on drug ordered. . If applicable, additional medications reviewed and ordered per protocol based on lifetime cumulative doses and/or treatment regimen.   Plan for follow-up and/or issues identified: No . I-vent associated with next due treatment: No . MD and/or nursing notified: No  Keith Trujillo 06/13/2019 8:59 AM 

## 2019-06-13 NOTE — Telephone Encounter (Signed)
LVM for pt to return my call.

## 2019-06-13 NOTE — Telephone Encounter (Signed)
Pt returned my call and has been scheduled for a flex sigmoidscopy at [facility] on 07/14/19.

## 2019-06-23 ENCOUNTER — Encounter: Payer: Self-pay | Admitting: Oncology

## 2019-06-23 ENCOUNTER — Other Ambulatory Visit: Payer: Self-pay

## 2019-06-23 ENCOUNTER — Inpatient Hospital Stay (HOSPITAL_BASED_OUTPATIENT_CLINIC_OR_DEPARTMENT_OTHER): Payer: No Typology Code available for payment source | Admitting: Oncology

## 2019-06-23 ENCOUNTER — Inpatient Hospital Stay: Payer: No Typology Code available for payment source | Attending: Oncology

## 2019-06-23 ENCOUNTER — Inpatient Hospital Stay: Payer: No Typology Code available for payment source

## 2019-06-23 VITALS — BP 124/69 | HR 59 | Temp 97.8°F | Ht 76.0 in | Wt 198.0 lb

## 2019-06-23 DIAGNOSIS — T451X5A Adverse effect of antineoplastic and immunosuppressive drugs, initial encounter: Secondary | ICD-10-CM

## 2019-06-23 DIAGNOSIS — C187 Malignant neoplasm of sigmoid colon: Secondary | ICD-10-CM | POA: Diagnosis not present

## 2019-06-23 DIAGNOSIS — Z5111 Encounter for antineoplastic chemotherapy: Secondary | ICD-10-CM | POA: Diagnosis present

## 2019-06-23 DIAGNOSIS — F17210 Nicotine dependence, cigarettes, uncomplicated: Secondary | ICD-10-CM | POA: Insufficient documentation

## 2019-06-23 DIAGNOSIS — Z79899 Other long term (current) drug therapy: Secondary | ICD-10-CM | POA: Insufficient documentation

## 2019-06-23 DIAGNOSIS — D6959 Other secondary thrombocytopenia: Secondary | ICD-10-CM | POA: Diagnosis not present

## 2019-06-23 DIAGNOSIS — D696 Thrombocytopenia, unspecified: Secondary | ICD-10-CM | POA: Diagnosis not present

## 2019-06-23 DIAGNOSIS — G620 Drug-induced polyneuropathy: Secondary | ICD-10-CM | POA: Diagnosis not present

## 2019-06-23 LAB — COMPREHENSIVE METABOLIC PANEL
ALT: 25 U/L (ref 0–44)
AST: 18 U/L (ref 15–41)
Albumin: 4 g/dL (ref 3.5–5.0)
Alkaline Phosphatase: 116 U/L (ref 38–126)
Anion gap: 8 (ref 5–15)
BUN: 12 mg/dL (ref 6–20)
CO2: 25 mmol/L (ref 22–32)
Calcium: 8.8 mg/dL — ABNORMAL LOW (ref 8.9–10.3)
Chloride: 107 mmol/L (ref 98–111)
Creatinine, Ser: 0.78 mg/dL (ref 0.61–1.24)
GFR calc Af Amer: >60 mL/min (ref >60)
GFR calc non Af Amer: >60 mL/min (ref >60)
Glucose, Bld: 99 mg/dL (ref 70–99)
Potassium: 3.9 mmol/L (ref 3.5–5.1)
Sodium: 140 mmol/L (ref 135–145)
Total Bilirubin: 0.5 mg/dL (ref 0.3–1.2)
Total Protein: 6.3 g/dL — ABNORMAL LOW (ref 6.5–8.1)

## 2019-06-23 LAB — CBC WITH DIFFERENTIAL/PLATELET
Abs Immature Granulocytes: 0.14 10*3/uL — ABNORMAL HIGH (ref 0.00–0.07)
Basophils Absolute: 0 10*3/uL (ref 0.0–0.1)
Basophils Relative: 0 %
Eosinophils Absolute: 0.1 10*3/uL (ref 0.0–0.5)
Eosinophils Relative: 1 %
HCT: 35 % — ABNORMAL LOW (ref 39.0–52.0)
Hemoglobin: 11.4 g/dL — ABNORMAL LOW (ref 13.0–17.0)
Immature Granulocytes: 1 %
Lymphocytes Relative: 9 %
Lymphs Abs: 1.1 10*3/uL (ref 0.7–4.0)
MCH: 31.3 pg (ref 26.0–34.0)
MCHC: 32.6 g/dL (ref 30.0–36.0)
MCV: 96.2 fL (ref 80.0–100.0)
Monocytes Absolute: 0.7 10*3/uL (ref 0.1–1.0)
Monocytes Relative: 5 %
Neutro Abs: 10.3 10*3/uL — ABNORMAL HIGH (ref 1.7–7.7)
Neutrophils Relative %: 84 %
Platelets: 105 10*3/uL — ABNORMAL LOW (ref 150–400)
RBC: 3.64 MIL/uL — ABNORMAL LOW (ref 4.22–5.81)
RDW: 15.7 % — ABNORMAL HIGH (ref 11.5–15.5)
WBC: 12.3 10*3/uL — ABNORMAL HIGH (ref 4.0–10.5)
nRBC: 0 % (ref 0.0–0.2)

## 2019-06-23 MED ORDER — LEUCOVORIN CALCIUM INJECTION 350 MG
850.0000 mg | Freq: Once | INTRAVENOUS | Status: AC
  Administered 2019-06-23: 850 mg via INTRAVENOUS
  Filled 2019-06-23: qty 25

## 2019-06-23 MED ORDER — SODIUM CHLORIDE 0.9 % IV SOLN
10.0000 mg | Freq: Once | INTRAVENOUS | Status: AC
  Administered 2019-06-23: 10 mg via INTRAVENOUS
  Filled 2019-06-23: qty 10

## 2019-06-23 MED ORDER — SODIUM CHLORIDE 0.9 % IV SOLN
INTRAVENOUS | Status: DC
  Filled 2019-06-23: qty 250

## 2019-06-23 MED ORDER — SODIUM CHLORIDE 0.9 % IV SOLN
2400.0000 mg/m2 | INTRAVENOUS | Status: DC
  Administered 2019-06-23: 5200 mg via INTRAVENOUS
  Filled 2019-06-23: qty 100

## 2019-06-23 MED ORDER — PALONOSETRON HCL INJECTION 0.25 MG/5ML
0.2500 mg | Freq: Once | INTRAVENOUS | Status: AC
  Administered 2019-06-23: 0.25 mg via INTRAVENOUS
  Filled 2019-06-23: qty 5

## 2019-06-23 NOTE — Progress Notes (Signed)
Patient stated that he had been doing well with no complaints. 

## 2019-06-25 ENCOUNTER — Other Ambulatory Visit: Payer: Self-pay

## 2019-06-25 ENCOUNTER — Inpatient Hospital Stay: Payer: No Typology Code available for payment source

## 2019-06-25 VITALS — BP 126/62 | HR 72 | Temp 97.7°F | Resp 18

## 2019-06-25 DIAGNOSIS — C187 Malignant neoplasm of sigmoid colon: Secondary | ICD-10-CM

## 2019-06-25 DIAGNOSIS — Z5111 Encounter for antineoplastic chemotherapy: Secondary | ICD-10-CM | POA: Diagnosis not present

## 2019-06-25 MED ORDER — SODIUM CHLORIDE 0.9% FLUSH
10.0000 mL | INTRAVENOUS | Status: DC | PRN
  Administered 2019-06-25: 10 mL
  Filled 2019-06-25: qty 10

## 2019-06-25 MED ORDER — HEPARIN SOD (PORK) LOCK FLUSH 100 UNIT/ML IV SOLN
500.0000 [IU] | Freq: Once | INTRAVENOUS | Status: AC | PRN
  Administered 2019-06-25: 500 [IU]
  Filled 2019-06-25: qty 5

## 2019-06-25 NOTE — Progress Notes (Signed)
Patient here for dc pump, udenyca on the treatment plan but not scheduled. Do not give injection today per Dr Janese Banks.

## 2019-06-26 NOTE — Progress Notes (Signed)
Hematology/Oncology Consult note [facility]  [phone] [phone]  Patient Care Team: Kendell Bane, NP as PCP - General (Adult Health Nurse Practitioner) Clent Jacks, RN as Oncology Nurse Navigator   Name of the patient: Keith Trujillo  [id number]  05-Sep-1960   Date of visit: 06/26/19  Diagnosis- stage IIIb colon adenocarcinoma  Chief complaint/ Reason for visit-on treatment assessment prior to cycle 7 of adjuvant FOLFOX chemotherapy  Heme/Onc history: patient is a 59 year old male who was seen by Dr. Remi Haggard GI for symptoms of diarrhea as well as abdominal pain.He underwent a colonoscopy on 01/02/2019 which showed an ulcerated partially obstructing large mass in the sigmoid colon which measured 10 cm in length biopsy showed adenocarcinoma moderately differentiated. Baseline CEA elevated at 71.7. He was seen by Dr. Dahlia Byes and will be undergoing laparoscopic hemicolectomy and lymph node sampling next week.  CT abdomen pelvis with contrast showed irregular marked circumferential wall thickening in the sigmoid colon near the rectosigmoid junction. The lesion longitudinally extends for approximately 5 to 6 cm. 4.1 x 2.8 cm ill-defined irregular paracolonic soft tissue mass may represent direct tumor extension or contiguous markedly enlarged lymph node. Abnormal lymph nodes also seen in the perirectal and presacral space concerning for metastatic disease. 1.5 cm ill-defined hypoattenuating lesion in the medial segment of the left liver which may be focal fatty deposition but cannot be definitively characterized.MRI did not show any evidence of liver metastases. CT chest was negative for metastatic disease.  Patient underwent a low anterior resection of the sigmoid mass. Final pathology showed 8 cm invasive colorectal adenocarcinoma grade 2. Tumor invades through the muscularis propria into pericolorectal tissue. All margins are  uninvolvedLymphovascular invasion and perineural invasion present. Lymph nodes 1+ out of 20 PT3PN1A. MSI stable  Adjuvant FOLFOX chemotherapy was started on 03/17/2019  Interval history-patient is tolerating chemotherapy well so far without any significant nausea or vomiting.  Peripheral neuropathy is mild and intermittent and has been reducing in intensity since we stopped giving him oxaliplatin.  ECOG PS- 1 Pain scale- 0  Review of systems- Review of Systems  Constitutional: Positive for malaise/fatigue. Negative for chills, fever and weight loss.  HENT: Negative for congestion, ear discharge and nosebleeds.   Eyes: Negative for blurred vision.  Respiratory: Negative for cough, hemoptysis, sputum production, shortness of breath and wheezing.   Cardiovascular: Negative for chest pain, palpitations, orthopnea and claudication.  Gastrointestinal: Negative for abdominal pain, blood in stool, constipation, diarrhea, heartburn, melena, nausea and vomiting.  Genitourinary: Negative for dysuria, flank pain, frequency, hematuria and urgency.  Musculoskeletal: Negative for back pain, joint pain and myalgias.  Skin: Negative for rash.  Neurological: Positive for sensory change (Peripheral neuropathy). Negative for dizziness, tingling, focal weakness, seizures, weakness and headaches.  Endo/Heme/Allergies: Does not bruise/bleed easily.  Psychiatric/Behavioral: Negative for depression and suicidal ideas. The patient does not have insomnia.       Allergies  Allergen Reactions   Sulfa Antibiotics Rash     Past Medical History:  Diagnosis Date   Abdominal pain    Allergy    Cancer ([place])    Cellulitis    Colon cancer (HCC)    Dyspnea    Liver spot    Loose stools    Weight loss      Past Surgical History:  Procedure Laterality Date   COLONOSCOPY WITH PROPOFOL N/A 01/02/2019   Procedure: COLONOSCOPY WITH BIOPSY;  Surgeon: [name], MD;  Location: [place];  Service: Endoscopy;  Laterality: N/A;  Clip x2 at Biopsy Site   COLOSTOMY Right 02/06/2019   Procedure: COLOSTOMY;  Surgeon: [name], MD;  Location: [facility];  Service: General;  Laterality: Right;   excision of skin lesion     LAPAROSCOPIC SIGMOID COLECTOMY N/A 02/06/2019   Procedure: LAPAROSCOPIC SIGMOID COLECTOMY converted to open procedure;  Surgeon: [name], MD;  Location: [facility];  Service: General;  Laterality: N/A;   PORTACATH PLACEMENT Right 03/06/2019   Procedure: INSERTION PORT-A-CATH;  Surgeon: [name], MD;  Location: [facility];  Service: General;  Laterality: Right;   TONSILLECTOMY AND ADENOIDECTOMY  1969    Social History   Socioeconomic History   Marital status: Married    Spouse name: Not on file   Number of children: Not on file   Years of education: Not on file   Highest education level: Not on file  Occupational History   Not on file  Tobacco Use   Smoking status: Light Tobacco Smoker    Types: Cigars   Smokeless tobacco: Former Systems developer    Quit date: 1980  Substance and Sexual Activity   Alcohol use: Yes    Comment: rare   Drug use: Never   Sexual activity: Yes  Other Topics Concern   Not on file  Social History Narrative   Not on file   Social Determinants of Health   Financial Resource Strain:    Difficulty of Paying Living Expenses:   Food Insecurity:    Worried About Charity fundraiser in the Last Year:    Arboriculturist in the Last Year:   Transportation Needs:    Film/video editor (Medical):    Lack of Transportation (Non-Medical):   Physical Activity:    Days of Exercise per Week:    Minutes of Exercise per Session:   Stress:    Feeling of Stress :   Social Connections:    Frequency of Communication with Friends and Family:    Frequency of Social Gatherings with Friends and Family:    Attends Religious Services:    Active Member of Clubs or Organizations:    Attends Programme researcher, broadcasting/film/video:    Marital Status:   Intimate Partner Violence:    Fear of Current or Ex-Partner:    Emotionally Abused:    Physically Abused:    Sexually Abused:     Family History  Problem Relation Age of Onset   Lung cancer Father      Current Outpatient Medications:    dexamethasone (DECADRON) 4 MG tablet, Take 2 tablets (8 mg total) by mouth daily. Start the day after chemotherapy for 2 days. Take with food., Disp: 30 tablet, Rfl: 1   lidocaine-prilocaine (EMLA) cream, Apply to affected area once, Disp: 30 g, Rfl: 3   Loperamide HCl (IMODIUM PO), Take 2 mg by mouth 2 (two) times daily as needed (diarrhea or loose stools). , Disp: , Rfl:    loratadine (CLARITIN) 10 MG tablet, Take 10 mg by mouth daily. Just around chemo for 4 days, Disp: , Rfl:    ondansetron (ZOFRAN) 8 MG tablet, Take 1 tablet (8 mg total) by mouth 2 (two) times daily as needed for refractory nausea / vomiting. Start on day 3 after chemotherapy. (Patient not taking: Reported on 05/19/2019), Disp: 30 tablet, Rfl: 1   prochlorperazine (COMPAZINE) 10 MG tablet, Take 1 tablet (10 mg total) by mouth every 6 (six) hours as needed (Nausea or  vomiting). (Patient not taking: Reported on 05/19/2019), Disp: 30 tablet, Rfl: 1  Physical exam:  Vitals:   06/23/19 0823  BP: 124/69  Pulse: (!) 59  Temp: 97.8 F (36.6 C)  TempSrc: Tympanic  Weight: 198 lb (89.8 kg)  Height: _0  (1.93 m)   Physical Exam HENT:     Head: Normocephalic and atraumatic.  Cardiovascular:     Rate and Rhythm: Normal rate and regular rhythm.     Heart sounds: Normal heart sounds.  Pulmonary:     Effort: Pulmonary effort is normal.     Breath sounds: Normal breath sounds.  Abdominal:     General: Bowel sounds are normal.     Palpations: Abdomen is soft.     Comments: Colostomy in place  Skin:    General: Skin is warm and dry.  Neurological:     Mental Status: He is alert and oriented to person, place, and time.       CMP Latest Ref Rng & Units 06/23/2019  Glucose 70 - 99 mg/dL 99  BUN 6 - 20 mg/dL 12  Creatinine 0.61 - 1.24 mg/dL 0.78  Sodium 135 - 145 mmol/L 140  Potassium 3.5 - 5.1 mmol/L 3.9  Chloride 98 - 111 mmol/L 107  CO2 22 - 32 mmol/L 25  Calcium 8.9 - 10.3 mg/dL 8.8(L)  Total Protein 6.5 - 8.1 g/dL 6.3(L)  Total Bilirubin 0.3 - 1.2 mg/dL 0.5  Alkaline Phos 38 - 126 U/L 116  AST 15 - 41 U/L 18  ALT 0 - 44 U/L 25   CBC Latest Ref Rng & Units 06/23/2019  WBC 4.0 - 10.5 K/uL 12.3(H)  Hemoglobin 13.0 - 17.0 g/dL 11.4(L)  Hematocrit 39.0 - 52.0 % 35.0(L)  Platelets 150 - 400 K/uL 105(L)    No images are attached to the encounter.  DG BE (COLON)W SINGLE CM (SOL OR THIN BA)  Result Date: 06/04/2019 CLINICAL DATA:  Status post sigmoid colectomy. History of colon cancer. EXAM: SINGLE COLUMN BARIUM ENEMA TECHNIQUE: Initial scout AP supine abdominal image obtained to insure adequate colon cleansing. Barium was introduced into the colon in a retrograde fashion and refluxed from the rectum to the cecum. Spot images of the colon followed by overhead radiographs were obtained. FLUOROSCOPY TIME:  Fluoroscopy Time:  1 minutes 48 seconds Radiation Exposure Index (if provided by the fluoroscopic device): 80.3 mGy Number of Acquired Spot Images: 0 COMPARISON:  None. FINDINGS: An enema catheter was inserted and retention balloon distended under fluoroscopy. Barium was then introduced. Examination was negative for filling defects to indicate the presence of a polyp or large mass lesion. Focal narrowing of the rectosigmoid colon at the surgical site which may reflect a focal area of scarring and stricture versus less likely under distension as this area persists throughout the examination with normal distension of the colon both proximal and distal to this short-segment. Contrast flows into the more proximal sigmoid colon into the descending colon, transverse colon and into the ascending colon without delay. No large  annular constricting lesion. No extraluminal contrast. IMPRESSION: Focal narrowing of the rectosigmoid colon at the surgical site which may reflect a focal area of scarring and stricture as this area persists throughout the examination with normal distension of the colon both proximal and distal to this short-segment. Electronically Signed   By: Kathreen Devoid   On: 06/04/2019 14:58     Assessment and plan- Patient is a 59 y.o. male with sigmoid adenocarcinoma stage IIIB p T3PN1A s/p  low anterior resection.  He is here for on treatment assessment prior to cycle 7 of adjuvant FOLFOX chemotherapy  Counts okay to proceed with cycle 7 of adjuvant FOLFOX chemotherapy today.  We will continue to hold his oxaliplatin at this time as well as his 5-FU bolus and  he is getting only getting infusional 5-FU chemotherapy due to ongoing thrombocytopenia.  Chemo-induced thrombocytopenia: Likely secondary to 5-FU and oxaliplatin.  Modifications as above.  Stable continue to monitor  Chemo-induced peripheral neuropathy: Mild grade 1 continue to monitor.  He is not on any medications at this time  I will see him back in 2 weeks time with CBC with differential and CMP for cycle 8 of infusional 5-FU chemotherapy   Visit Diagnosis 1. Encounter for antineoplastic chemotherapy   2. Chemotherapy-induced thrombocytopenia   3. Cancer of sigmoid ([place])   4. Chemotherapy-induced peripheral neuropathy ([place])      Dr. Randa Evens, MD, MPH [facility] at [facility] [phone] 06/26/2019 4:40 AM

## 2019-06-30 NOTE — Progress Notes (Signed)
Pharmacist Chemotherapy Monitoring - Follow Up Assessment    I verify that I have reviewed each item in the below checklist:  . Regimen for the patient is scheduled for the appropriate day and plan matches scheduled date. Marland Kitchen Appropriate non-routine labs are ordered dependent on drug ordered. . If applicable, additional medications reviewed and ordered per protocol based on lifetime cumulative doses and/or treatment regimen.   Plan for follow-up and/or issues identified: No . I-vent associated with next due treatment: No . MD and/or nursing notified: No  Keith Trujillo K 06/30/2019 8:26 AM

## 2019-07-07 ENCOUNTER — Inpatient Hospital Stay: Payer: No Typology Code available for payment source

## 2019-07-07 ENCOUNTER — Ambulatory Visit: Payer: 59 | Admitting: Oncology

## 2019-07-07 ENCOUNTER — Other Ambulatory Visit: Payer: 59

## 2019-07-07 ENCOUNTER — Other Ambulatory Visit: Payer: Self-pay

## 2019-07-07 ENCOUNTER — Encounter: Payer: Self-pay | Admitting: Oncology

## 2019-07-07 ENCOUNTER — Ambulatory Visit: Payer: 59 | Admitting: Surgery

## 2019-07-07 ENCOUNTER — Ambulatory Visit: Payer: 59

## 2019-07-07 ENCOUNTER — Inpatient Hospital Stay (HOSPITAL_BASED_OUTPATIENT_CLINIC_OR_DEPARTMENT_OTHER): Payer: No Typology Code available for payment source | Admitting: Oncology

## 2019-07-07 VITALS — BP 144/69 | Resp 18

## 2019-07-07 VITALS — BP 119/93 | HR 93 | Temp 98.1°F | Wt 192.0 lb

## 2019-07-07 DIAGNOSIS — G620 Drug-induced polyneuropathy: Secondary | ICD-10-CM | POA: Diagnosis not present

## 2019-07-07 DIAGNOSIS — C187 Malignant neoplasm of sigmoid colon: Secondary | ICD-10-CM

## 2019-07-07 DIAGNOSIS — T451X5A Adverse effect of antineoplastic and immunosuppressive drugs, initial encounter: Secondary | ICD-10-CM

## 2019-07-07 DIAGNOSIS — D6481 Anemia due to antineoplastic chemotherapy: Secondary | ICD-10-CM

## 2019-07-07 DIAGNOSIS — D6959 Other secondary thrombocytopenia: Secondary | ICD-10-CM

## 2019-07-07 DIAGNOSIS — Z5111 Encounter for antineoplastic chemotherapy: Secondary | ICD-10-CM | POA: Diagnosis not present

## 2019-07-07 LAB — CBC WITH DIFFERENTIAL/PLATELET
Abs Immature Granulocytes: 0.01 10*3/uL (ref 0.00–0.07)
Basophils Absolute: 0 10*3/uL (ref 0.0–0.1)
Basophils Relative: 0 %
Eosinophils Absolute: 0 10*3/uL (ref 0.0–0.5)
Eosinophils Relative: 0 %
HCT: 35.9 % — ABNORMAL LOW (ref 39.0–52.0)
Hemoglobin: 11.7 g/dL — ABNORMAL LOW (ref 13.0–17.0)
Immature Granulocytes: 0 %
Lymphocytes Relative: 7 %
Lymphs Abs: 0.5 10*3/uL — ABNORMAL LOW (ref 0.7–4.0)
MCH: 31.8 pg (ref 26.0–34.0)
MCHC: 32.6 g/dL (ref 30.0–36.0)
MCV: 97.6 fL (ref 80.0–100.0)
Monocytes Absolute: 0.8 10*3/uL (ref 0.1–1.0)
Monocytes Relative: 13 %
Neutro Abs: 5.2 10*3/uL (ref 1.7–7.7)
Neutrophils Relative %: 80 %
Platelets: 124 10*3/uL — ABNORMAL LOW (ref 150–400)
RBC: 3.68 MIL/uL — ABNORMAL LOW (ref 4.22–5.81)
RDW: 15.7 % — ABNORMAL HIGH (ref 11.5–15.5)
WBC: 6.5 10*3/uL (ref 4.0–10.5)
nRBC: 0 % (ref 0.0–0.2)

## 2019-07-07 LAB — COMPREHENSIVE METABOLIC PANEL
ALT: 32 U/L (ref 0–44)
AST: 30 U/L (ref 15–41)
Albumin: 4.2 g/dL (ref 3.5–5.0)
Alkaline Phosphatase: 89 U/L (ref 38–126)
Anion gap: 9 (ref 5–15)
BUN: 19 mg/dL (ref 6–20)
CO2: 25 mmol/L (ref 22–32)
Calcium: 8.7 mg/dL — ABNORMAL LOW (ref 8.9–10.3)
Chloride: 104 mmol/L (ref 98–111)
Creatinine, Ser: 0.8 mg/dL (ref 0.61–1.24)
GFR calc Af Amer: >60 mL/min (ref >60)
GFR calc non Af Amer: >60 mL/min (ref >60)
Glucose, Bld: 140 mg/dL — ABNORMAL HIGH (ref 70–99)
Potassium: 3.8 mmol/L (ref 3.5–5.1)
Sodium: 138 mmol/L (ref 135–145)
Total Bilirubin: 0.8 mg/dL (ref 0.3–1.2)
Total Protein: 6.7 g/dL (ref 6.5–8.1)

## 2019-07-07 MED ORDER — SODIUM CHLORIDE 0.9 % IV SOLN
10.0000 mg | Freq: Once | INTRAVENOUS | Status: AC
  Administered 2019-07-07: 10 mg via INTRAVENOUS
  Filled 2019-07-07: qty 10

## 2019-07-07 MED ORDER — OXALIPLATIN CHEMO INJECTION 100 MG/20ML
65.0000 mg/m2 | Freq: Once | INTRAVENOUS | Status: AC
  Administered 2019-07-07: 140 mg via INTRAVENOUS
  Filled 2019-07-07: qty 20

## 2019-07-07 MED ORDER — PALONOSETRON HCL INJECTION 0.25 MG/5ML
0.2500 mg | Freq: Once | INTRAVENOUS | Status: AC
  Administered 2019-07-07: 0.25 mg via INTRAVENOUS
  Filled 2019-07-07: qty 5

## 2019-07-07 MED ORDER — LEUCOVORIN CALCIUM INJECTION 350 MG
850.0000 mg | Freq: Once | INTRAVENOUS | Status: AC
  Administered 2019-07-07: 850 mg via INTRAVENOUS
  Filled 2019-07-07: qty 25

## 2019-07-07 MED ORDER — DEXTROSE 5 % IV SOLN
Freq: Once | INTRAVENOUS | Status: AC
  Filled 2019-07-07: qty 250

## 2019-07-07 MED ORDER — SODIUM CHLORIDE 0.9 % IV SOLN
2400.0000 mg/m2 | INTRAVENOUS | Status: DC
  Administered 2019-07-07: 5200 mg via INTRAVENOUS
  Filled 2019-07-07: qty 104

## 2019-07-07 MED ORDER — SODIUM CHLORIDE 0.9% FLUSH
10.0000 mL | INTRAVENOUS | Status: DC | PRN
  Administered 2019-07-07: 10 mL via INTRAVENOUS
  Filled 2019-07-07: qty 10

## 2019-07-07 MED ORDER — SODIUM CHLORIDE 0.9% FLUSH
10.0000 mL | INTRAVENOUS | Status: DC | PRN
  Administered 2019-07-07: 10 mL
  Filled 2019-07-07: qty 10

## 2019-07-07 NOTE — Progress Notes (Signed)
Patient stated that he had been doing well with no concerns. Patient stated that his heart rate had been elevated since earlier this AM.

## 2019-07-07 NOTE — Progress Notes (Signed)
Hematology/Oncology Consult note [facility]  Telephone:([phone] [phone]  Patient Care Team: Kendell Bane, NP as PCP - General (Adult Health Nurse Practitioner) Clent Jacks, RN as Oncology Nurse Navigator   Name of the patient: Keith Trujillo  [id number]  31-Jan-1961   Date of visit: 07/07/19  Diagnosis- stage IIIb colon adenocarcinoma  Chief complaint/ Reason for visit-on treatment assessment prior to cycle 8 of adjuvant FOLFOX chemotherapy  Heme/Onc history: patient is a 59 year old male who was seen by Dr. Remi Haggard GI for symptoms of diarrhea as well as abdominal pain.He underwent a colonoscopy on 01/02/2019 which showed an ulcerated partially obstructing large mass in the sigmoid colon which measured 10 cm in length biopsy showed adenocarcinoma moderately differentiated. Baseline CEA elevated at 71.7. He was seen by Dr. Dahlia Byes and will be undergoing laparoscopic hemicolectomy and lymph node sampling next week.  CT abdomen pelvis with contrast showed irregular marked circumferential wall thickening in the sigmoid colon near the rectosigmoid junction. The lesion longitudinally extends for approximately 5 to 6 cm. 4.1 x 2.8 cm ill-defined irregular paracolonic soft tissue mass may represent direct tumor extension or contiguous markedly enlarged lymph node. Abnormal lymph nodes also seen in the perirectal and presacral space concerning for metastatic disease. 1.5 cm ill-defined hypoattenuating lesion in the medial segment of the left liver which may be focal fatty deposition but cannot be definitively characterized.MRI did not show any evidence of liver metastases. CT chest was negative for metastatic disease.  Patient underwent a low anterior resection of the sigmoid mass. Final pathology showed 8 cm invasive colorectal adenocarcinoma grade 2. Tumor invades through the muscularis propria into pericolorectal tissue. All margins are  uninvolvedLymphovascular invasion and perineural invasion present. Lymph nodes 1+ out of 20 PT3PN1A. MSI stable  Adjuvant FOLFOX chemotherapy was started on 03/17/2019   Interval history-is tolerating chemotherapy well without any significant nausea or vomiting.  He has mild tingling numbness in his hands and feet which is intermittent.  Denies other complaints at this time.  Colostomy is working well and bowel movements have been regular  ECOG PS- 1 Pain scale- 0  Review of systems- Review of Systems  Constitutional: Negative for chills, fever, malaise/fatigue and weight loss.  HENT: Negative for congestion, ear discharge and nosebleeds.   Eyes: Negative for blurred vision.  Respiratory: Negative for cough, hemoptysis, sputum production, shortness of breath and wheezing.   Cardiovascular: Negative for chest pain, palpitations, orthopnea and claudication.  Gastrointestinal: Negative for abdominal pain, blood in stool, constipation, diarrhea, heartburn, melena, nausea and vomiting.  Genitourinary: Negative for dysuria, flank pain, frequency, hematuria and urgency.  Musculoskeletal: Negative for back pain, joint pain and myalgias.  Skin: Negative for rash.  Neurological: Negative for dizziness, tingling, focal weakness, seizures, weakness and headaches.  Endo/Heme/Allergies: Does not bruise/bleed easily.  Psychiatric/Behavioral: Negative for depression and suicidal ideas. The patient does not have insomnia.        Allergies  Allergen Reactions  . Sulfa Antibiotics Rash     Past Medical History:  Diagnosis Date  . Abdominal pain   . Allergy   . Cancer ([place])   . Cellulitis   . Colon cancer ([place])   . Dyspnea   . Liver spot   . Loose stools   . Weight loss      Past Surgical History:  Procedure Laterality Date  . COLONOSCOPY WITH PROPOFOL N/A 01/02/2019   Procedure: COLONOSCOPY WITH BIOPSY;  Surgeon: [name], MD;  [facility];  Service:  Endoscopy;  Laterality: N/A;  Clip x2 at Biopsy Site  . COLOSTOMY Right 02/06/2019   Procedure: COLOSTOMY;  Surgeon: [name], MD;  Location: [facility] ORS;  Service: General;  Laterality: Right;  . excision of skin lesion    . LAPAROSCOPIC SIGMOID COLECTOMY N/A 02/06/2019   Procedure: LAPAROSCOPIC SIGMOID COLECTOMY converted to open procedure;  Surgeon: [name], MD;  Location: [facility];  Service: General;  Laterality: N/A;  . PORTACATH PLACEMENT Right 03/06/2019   Procedure: INSERTION PORT-A-CATH;  Surgeon: [name], MD;  Location: [facility] ORS;  Service: General;  Laterality: Right;  . TONSILLECTOMY AND ADENOIDECTOMY  1969    Social History   Socioeconomic History  . Marital status: Married    Spouse name: Not on file  . Number of children: Not on file  . Years of education: Not on file  . Highest education level: Not on file  Occupational History  . Not on file  Tobacco Use  . Smoking status: Light Tobacco Smoker    Types: Cigars  . Smokeless tobacco: Former Systems developer    Quit date: 1980  Substance and Sexual Activity  . Alcohol use: Yes    Comment: rare  . Drug use: Never  . Sexual activity: Yes  Other Topics Concern  . Not on file  Social History Narrative  . Not on file   Social Determinants of Health   Financial Resource Strain:   . Difficulty of Paying Living Expenses:   Food Insecurity:   . Worried About Charity fundraiser in the Last Year:   . Arboriculturist in the Last Year:   Transportation Needs:   . Film/video editor (Medical):   Marland Kitchen Lack of Transportation (Non-Medical):   Physical Activity:   . Days of Exercise per Week:   . Minutes of Exercise per Session:   Stress:   . Feeling of Stress :   Social Connections:   . Frequency of Communication with Friends and Family:   . Frequency of Social Gatherings with Friends and Family:   . Attends Religious Services:   . Active Member of Clubs or Organizations:   . Attends Archivist  Meetings:   Marland Kitchen Marital Status:   Intimate Partner Violence:   . Fear of Current or Ex-Partner:   . Emotionally Abused:   Marland Kitchen Physically Abused:   . Sexually Abused:     Family History  Problem Relation Age of Onset  . Lung cancer Father      Current Outpatient Medications:  .  dexamethasone (DECADRON) 4 MG tablet, Take 2 tablets (8 mg total) by mouth daily. Start the day after chemotherapy for 2 days. Take with food., Disp: 30 tablet, Rfl: 1 .  lidocaine-prilocaine (EMLA) cream, Apply to affected area once, Disp: 30 g, Rfl: 3 .  Loperamide HCl (IMODIUM PO), Take 2 mg by mouth 2 (two) times daily as needed (diarrhea or loose stools). , Disp: , Rfl:  .  loratadine (CLARITIN) 10 MG tablet, Take 10 mg by mouth daily. Just around chemo for 4 days, Disp: , Rfl:  .  ondansetron (ZOFRAN) 8 MG tablet, Take 1 tablet (8 mg total) by mouth 2 (two) times daily as needed for refractory nausea / vomiting. Start on day 3 after chemotherapy. (Patient not taking: Reported on 05/19/2019), Disp: 30 tablet, Rfl: 1 .  prochlorperazine (COMPAZINE) 10 MG tablet, Take 1 tablet (10 mg total) by mouth every 6 (six) hours  as needed (Nausea or vomiting). (Patient not taking: Reported on 05/19/2019), Disp: 30 tablet, Rfl: 1 No current facility-administered medications for this visit.  Facility-Administered Medications Ordered in Other Visits:  .  sodium chloride flush (NS) 0.9 % injection 10 mL, 10 mL, Intravenous, PRN, Sindy Guadeloupe, MD, 10 mL at 07/07/19 0818  Physical exam:  Vitals:   07/07/19 0841  BP: (!) 119/93  Pulse: 93  Temp: 98.1 F (36.7 C)  TempSrc: Tympanic  Weight: 192 lb (87.1 kg)   Physical Exam Cardiovascular:     Rate and Rhythm: Normal rate and regular rhythm.     Heart sounds: Normal heart sounds.  Pulmonary:     Effort: Pulmonary effort is normal.     Breath sounds: Normal breath sounds.  Abdominal:     General: Bowel sounds are normal.     Palpations: Abdomen is soft.     Comments:  Colostomy in place  Skin:    General: Skin is warm and dry.  Neurological:     Mental Status: He is alert and oriented to person, place, and time.      CMP Latest Ref Rng & Units 06/23/2019  Glucose 70 - 99 mg/dL 99  BUN 6 - 20 mg/dL 12  Creatinine 0.61 - 1.24 mg/dL 0.78  Sodium 135 - 145 mmol/L 140  Potassium 3.5 - 5.1 mmol/L 3.9  Chloride 98 - 111 mmol/L 107  CO2 22 - 32 mmol/L 25  Calcium 8.9 - 10.3 mg/dL 8.8(L)  Total Protein 6.5 - 8.1 g/dL 6.3(L)  Total Bilirubin 0.3 - 1.2 mg/dL 0.5  Alkaline Phos 38 - 126 U/L 116  AST 15 - 41 U/L 18  ALT 0 - 44 U/L 25   CBC Latest Ref Rng & Units 07/07/2019  WBC 4.0 - 10.5 K/uL 6.5  Hemoglobin 13.0 - 17.0 g/dL 11.7(L)  Hematocrit 39.0 - 52.0 % 35.9(L)  Platelets 150 - 400 K/uL 124(L)      Assessment and plan- Patient is a 59 y.o. 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  He is here for on treatment assessment prior to cycle 8 of adjuvant FOLFOX chemotherapy  Counts okay to proceed with cycle 8 of adjuvant FOLFOX chemotherapy today.  I will plan to give him oxaliplatin today at 65 mg per metered square since his platelet counts are up to 124.  For the most part he has not been able to receive oxaliplatin due to significant thrombocytopenia.  We have also been holding his 5-FU bolus.  He will receive his infusional 5-FU chemotherapy today and come back on day 3 for pump disconnect.  He will receive Udenyca on that day since he has had chemo-induced neutropenia in the past.  Chemo-induced peripheral neuropathy: Stable continue to monitor  Chemo-induced thrombocytopenia: Improved today and will receive oxaliplatin as above.  Chemo-induced anemia: Stable continue to monitor  I will see him back in 2 weeks time with CBC with differential, CMP for cycle 9 of infusional 5-FU chemotherapy plus minus oxaliplatin on day 1 and pump disconnect and Udenyca on day 3   Visit Diagnosis 1. Chemotherapy-induced  peripheral neuropathy ([place])   2. Chemotherapy-induced thrombocytopenia   3. Encounter for antineoplastic chemotherapy   4. Cancer of sigmoid ([place])   5. Antineoplastic chemotherapy induced anemia      Dr. Randa Evens, MD, MPH [facility] [phone] 07/07/2019 8:36 AM

## 2019-07-08 ENCOUNTER — Encounter: Payer: Self-pay | Admitting: Anesthesiology

## 2019-07-08 ENCOUNTER — Other Ambulatory Visit: Payer: Self-pay

## 2019-07-08 ENCOUNTER — Encounter: Payer: Self-pay | Admitting: Gastroenterology

## 2019-07-09 ENCOUNTER — Ambulatory Visit: Payer: 59 | Admitting: Surgery

## 2019-07-09 ENCOUNTER — Inpatient Hospital Stay: Payer: No Typology Code available for payment source

## 2019-07-09 DIAGNOSIS — Z5111 Encounter for antineoplastic chemotherapy: Secondary | ICD-10-CM | POA: Diagnosis not present

## 2019-07-09 DIAGNOSIS — C187 Malignant neoplasm of sigmoid colon: Secondary | ICD-10-CM

## 2019-07-09 MED ORDER — PEGFILGRASTIM-CBQV 6 MG/0.6ML ~~LOC~~ SOSY
6.0000 mg | PREFILLED_SYRINGE | Freq: Once | SUBCUTANEOUS | Status: AC
  Administered 2019-07-09: 6 mg via SUBCUTANEOUS
  Filled 2019-07-09: qty 0.6

## 2019-07-09 MED ORDER — HEPARIN SOD (PORK) LOCK FLUSH 100 UNIT/ML IV SOLN
500.0000 [IU] | Freq: Once | INTRAVENOUS | Status: AC | PRN
  Administered 2019-07-09: 15:00:00 500 [IU]
  Filled 2019-07-09: qty 5

## 2019-07-10 ENCOUNTER — Other Ambulatory Visit
Admission: RE | Admit: 2019-07-10 | Discharge: 2019-07-10 | Disposition: A | Discharge status: Home / Self Care | Payer: 59 | Source: Ambulatory Visit | Attending: Gastroenterology | Admitting: Gastroenterology

## 2019-07-10 ENCOUNTER — Other Ambulatory Visit: Payer: Self-pay

## 2019-07-10 DIAGNOSIS — Z01812 Encounter for preprocedural laboratory examination: Secondary | ICD-10-CM | POA: Insufficient documentation

## 2019-07-10 DIAGNOSIS — U071 COVID-19: Secondary | ICD-10-CM | POA: Insufficient documentation

## 2019-07-10 LAB — SARS CORONAVIRUS 2 (TAT 6-24 HRS): SARS Coronavirus 2: POSITIVE — AB

## 2019-07-11 ENCOUNTER — Telehealth: Payer: Self-pay

## 2019-07-11 ENCOUNTER — Telehealth: Payer: Self-pay | Admitting: Nurse Practitioner

## 2019-07-11 NOTE — Telephone Encounter (Signed)
Called to Discuss with patient about Covid symptoms and the use of bamlanivimab, a monoclonal antibody infusion for those with mild to moderate Covid symptoms and at a high risk of hospitalization.     Pt is qualified for this infusion at the [facility] infusion center due to co-morbid conditions and/or a member of an at-risk group.     Patient would like to get infusion at [facility] clinic if possible. Contact info given to patient.

## 2019-07-11 NOTE — Telephone Encounter (Signed)
Pt notified of COVID results.

## 2019-07-11 NOTE — Telephone Encounter (Signed)
-----   Message from [name], MD sent at 07/11/2019 10:01 AM EDT ----- I left a message with the patient this morning on his cell phone with my cell phone number to tell him that he should contact me as soon as possible.  I have also tried his wife's phone both phones gave me a voicemail.  Can you please try and contact the patient to let him know that his Covid test was positive and he should quarantine and not expose others to it.

## 2019-07-14 ENCOUNTER — Ambulatory Visit: Admission: RE | Admit: 2019-07-14 | Payer: 59 | Source: Home / Self Care | Admitting: Gastroenterology

## 2019-07-14 SURGERY — SIGMOIDOSCOPY, FLEXIBLE
Anesthesia: Choice

## 2019-07-14 NOTE — Progress Notes (Signed)
Pharmacist Chemotherapy Monitoring - Follow Up Assessment    I verify that I have reviewed each item in the below checklist:  . Regimen for the patient is scheduled for the appropriate day and plan matches scheduled date. Marland Kitchen Appropriate non-routine labs are ordered dependent on drug ordered. . If applicable, additional medications reviewed and ordered per protocol based on lifetime cumulative doses and/or treatment regimen.   Plan for follow-up and/or issues identified: No . I-vent associated with next due treatment: No . MD and/or nursing notified: No  Fallynn Gravett K 07/14/2019 8:51 AM

## 2019-07-16 ENCOUNTER — Ambulatory Visit: Payer: 59 | Admitting: Surgery

## 2019-07-21 ENCOUNTER — Other Ambulatory Visit: Payer: Self-pay

## 2019-07-21 ENCOUNTER — Inpatient Hospital Stay: Payer: No Typology Code available for payment source

## 2019-07-21 ENCOUNTER — Encounter: Payer: Self-pay | Admitting: Oncology

## 2019-07-21 ENCOUNTER — Inpatient Hospital Stay: Payer: No Typology Code available for payment source | Attending: Oncology

## 2019-07-21 ENCOUNTER — Inpatient Hospital Stay (HOSPITAL_BASED_OUTPATIENT_CLINIC_OR_DEPARTMENT_OTHER): Payer: No Typology Code available for payment source | Admitting: Oncology

## 2019-07-21 VITALS — BP 101/70 | HR 67 | Temp 96.4°F | Resp 16 | Ht 76.0 in | Wt 181.3 lb

## 2019-07-21 DIAGNOSIS — F17210 Nicotine dependence, cigarettes, uncomplicated: Secondary | ICD-10-CM | POA: Insufficient documentation

## 2019-07-21 DIAGNOSIS — Z5111 Encounter for antineoplastic chemotherapy: Secondary | ICD-10-CM | POA: Insufficient documentation

## 2019-07-21 DIAGNOSIS — R634 Abnormal weight loss: Secondary | ICD-10-CM | POA: Diagnosis not present

## 2019-07-21 DIAGNOSIS — C187 Malignant neoplasm of sigmoid colon: Secondary | ICD-10-CM

## 2019-07-21 DIAGNOSIS — R0981 Nasal congestion: Secondary | ICD-10-CM | POA: Diagnosis not present

## 2019-07-21 DIAGNOSIS — D6481 Anemia due to antineoplastic chemotherapy: Secondary | ICD-10-CM | POA: Diagnosis not present

## 2019-07-21 DIAGNOSIS — G620 Drug-induced polyneuropathy: Secondary | ICD-10-CM | POA: Diagnosis not present

## 2019-07-21 DIAGNOSIS — U071 COVID-19: Secondary | ICD-10-CM | POA: Diagnosis not present

## 2019-07-21 DIAGNOSIS — Z8616 Personal history of COVID-19: Secondary | ICD-10-CM | POA: Diagnosis not present

## 2019-07-21 DIAGNOSIS — T451X5A Adverse effect of antineoplastic and immunosuppressive drugs, initial encounter: Secondary | ICD-10-CM | POA: Insufficient documentation

## 2019-07-21 DIAGNOSIS — Z5189 Encounter for other specified aftercare: Secondary | ICD-10-CM | POA: Diagnosis not present

## 2019-07-21 DIAGNOSIS — R202 Paresthesia of skin: Secondary | ICD-10-CM | POA: Diagnosis not present

## 2019-07-21 DIAGNOSIS — R200 Anesthesia of skin: Secondary | ICD-10-CM | POA: Diagnosis not present

## 2019-07-21 DIAGNOSIS — Z801 Family history of malignant neoplasm of trachea, bronchus and lung: Secondary | ICD-10-CM | POA: Insufficient documentation

## 2019-07-21 DIAGNOSIS — Z79899 Other long term (current) drug therapy: Secondary | ICD-10-CM | POA: Insufficient documentation

## 2019-07-21 LAB — COMPREHENSIVE METABOLIC PANEL
ALT: 29 U/L (ref 0–44)
AST: 21 U/L (ref 15–41)
Albumin: 3.8 g/dL (ref 3.5–5.0)
Alkaline Phosphatase: 96 U/L (ref 38–126)
Anion gap: 9 (ref 5–15)
BUN: 22 mg/dL — ABNORMAL HIGH (ref 6–20)
CO2: 23 mmol/L (ref 22–32)
Calcium: 8.8 mg/dL — ABNORMAL LOW (ref 8.9–10.3)
Chloride: 105 mmol/L (ref 98–111)
Creatinine, Ser: 1.03 mg/dL (ref 0.61–1.24)
GFR calc Af Amer: >60 mL/min (ref >60)
GFR calc non Af Amer: >60 mL/min (ref >60)
Glucose, Bld: 120 mg/dL — ABNORMAL HIGH (ref 70–99)
Potassium: 4 mmol/L (ref 3.5–5.1)
Sodium: 137 mmol/L (ref 135–145)
Total Bilirubin: 0.7 mg/dL (ref 0.3–1.2)
Total Protein: 6.7 g/dL (ref 6.5–8.1)

## 2019-07-21 LAB — CBC WITH DIFFERENTIAL/PLATELET
Abs Immature Granulocytes: 0.22 10*3/uL — ABNORMAL HIGH (ref 0.00–0.07)
Basophils Absolute: 0 10*3/uL (ref 0.0–0.1)
Basophils Relative: 0 %
Eosinophils Absolute: 0.1 10*3/uL (ref 0.0–0.5)
Eosinophils Relative: 1 %
HCT: 37.5 % — ABNORMAL LOW (ref 39.0–52.0)
Hemoglobin: 12.1 g/dL — ABNORMAL LOW (ref 13.0–17.0)
Immature Granulocytes: 2 %
Lymphocytes Relative: 11 %
Lymphs Abs: 1.5 10*3/uL (ref 0.7–4.0)
MCH: 30.9 pg (ref 26.0–34.0)
MCHC: 32.3 g/dL (ref 30.0–36.0)
MCV: 95.7 fL (ref 80.0–100.0)
Monocytes Absolute: 0.6 10*3/uL (ref 0.1–1.0)
Monocytes Relative: 5 %
Neutro Abs: 10.8 10*3/uL — ABNORMAL HIGH (ref 1.7–7.7)
Neutrophils Relative %: 81 %
Platelets: 174 10*3/uL (ref 150–400)
RBC: 3.92 MIL/uL — ABNORMAL LOW (ref 4.22–5.81)
RDW: 14.8 % (ref 11.5–15.5)
WBC: 13.2 10*3/uL — ABNORMAL HIGH (ref 4.0–10.5)
nRBC: 0 % (ref 0.0–0.2)

## 2019-07-21 MED ORDER — HEPARIN SOD (PORK) LOCK FLUSH 100 UNIT/ML IV SOLN
500.0000 [IU] | Freq: Once | INTRAVENOUS | Status: AC
  Administered 2019-07-21: 500 [IU] via INTRAVENOUS
  Filled 2019-07-21: qty 5

## 2019-07-21 NOTE — Progress Notes (Signed)
Pt cont. To have diarrhea but imodium helps. Pt eating and drinking good. Pt was to have procedure and was having sinus issues and tested positive for covid. He now can's smell vinegar.

## 2019-07-23 ENCOUNTER — Inpatient Hospital Stay: Payer: No Typology Code available for payment source

## 2019-07-23 NOTE — Progress Notes (Signed)
Hematology/Oncology Consult note [facility]  [phone] [phone]  Patient Care Team: Kendell Bane, NP as PCP - General (Adult Health Nurse Practitioner) Clent Jacks, RN as Oncology Nurse Navigator   Name of the patient: Keith Trujillo  [id number]  [birth year]   Date of visit: 07/23/19  Diagnosis- stage IIIb colon adenocarcinoma  Chief complaint/ Reason for visit-on treatment assessment prior to cycle 9 of adjuvant FOLFOX chemotherapy  Heme/Onc history: patient is a 59 year old male who was seen by Dr. Remi Haggard GI for symptoms of diarrhea as well as abdominal pain.He underwent a colonoscopy on 01/02/2019 which showed an ulcerated partially obstructing large mass in the sigmoid colon which measured 10 cm in length biopsy showed adenocarcinoma moderately differentiated. Baseline CEA elevated at 71.7. He was seen by Dr. Dahlia Byes and will be undergoing laparoscopic hemicolectomy and lymph node sampling next week.  CT abdomen pelvis with contrast showed irregular marked circumferential wall thickening in the sigmoid colon near the rectosigmoid junction. The lesion longitudinally extends for approximately 5 to 6 cm. 4.1 x 2.8 cm ill-defined irregular paracolonic soft tissue mass may represent direct tumor extension or contiguous markedly enlarged lymph node. Abnormal lymph nodes also seen in the perirectal and presacral space concerning for metastatic disease. 1.5 cm ill-defined hypoattenuating lesion in the medial segment of the left liver which may be focal fatty deposition but cannot be definitively characterized.MRI did not show any evidence of liver metastases. CT chest was negative for metastatic disease.  Patient underwent a low anterior resection of the sigmoid mass. Final pathology showed 8 cm invasive colorectal adenocarcinoma grade 2. Tumor invades through the muscularis propria into pericolorectal tissue. All margins are  uninvolvedLymphovascular invasion and perineural invasion present. Lymph nodes 1+ out of 20 PT3PN1A. MSI stable  Adjuvant FOLFOX chemotherapy was started on 03/17/2019   Interval history- Patient underwent barium enema with Dr. Dahlia Byes in March 2021 which showed focal narrowing of the rectosigmoid colon at the surgical site which may reflect area of scarring and stricture. Patient was referred to Dr. Allen Norris for sigmoidoscopy but incidentally tested positive for Covid. He has mild sinus symptoms but no fever cough or shortness of breath. He received outpatient dose of bamlanavamib  ECOG PS- 1 Pain scale- 0   Review of systems- Review of Systems  Constitutional: Negative for chills, fever, malaise/fatigue and weight loss.  HENT: Positive for congestion. Negative for ear discharge and nosebleeds.   Eyes: Negative for blurred vision.  Respiratory: Negative for cough, hemoptysis, sputum production, shortness of breath and wheezing.   Cardiovascular: Negative for chest pain, palpitations, orthopnea and claudication.  Gastrointestinal: Negative for abdominal pain, blood in stool, constipation, diarrhea, heartburn, melena, nausea and vomiting.  Genitourinary: Negative for dysuria, flank pain, frequency, hematuria and urgency.  Musculoskeletal: Negative for back pain, joint pain and myalgias.  Skin: Negative for rash.  Neurological: Negative for dizziness, tingling, focal weakness, seizures, weakness and headaches.  Endo/Heme/Allergies: Does not bruise/bleed easily.  Psychiatric/Behavioral: Negative for depression and suicidal ideas. The patient does not have insomnia.       Allergies  Allergen Reactions  . Sulfa Antibiotics Rash     Past Medical History:  Diagnosis Date  . Abdominal pain   . Allergy   . Cancer ([place])   . Cellulitis   . Colon cancer ([place])   . Dyspnea   . Liver spot   . Loose stools   . Weight loss      Past Surgical History:  Procedure Laterality Date  .  COLONOSCOPY WITH PROPOFOL N/A 01/02/2019   Procedure: COLONOSCOPY WITH BIOPSY;  Surgeon: [name], MD;  Location: [place];  Service: Endoscopy;  Laterality: N/A;  Clip x2 at Biopsy Site  . COLOSTOMY Right 02/06/2019   Procedure: COLOSTOMY;  Surgeon: [name], MD;  Location: [facility] OR[facility];  Service: General;  Laterality: Right;  . excision of skin lesion    . LAPAROSCOPIC SIGMOID COLECTOMY N/A 02/06/2019   Procedure: LAPAROSCOPIC SIGMOID COLECTOMY converted to open procedure;  Surgeon: [name], MD;  Location: [facility];  Service: General;  Laterality: N/A;  . PORTACATH PLACEMENT Right 03/06/2019   Procedure: INSERTION PORT-A-CATH;  Surgeon: [name], MD;  Location: [facility] ORS;  Service: General;  Laterality: Right;  . TONSILLECTOMY AND ADENOIDECTOMY  1969    Social History   Socioeconomic History  . Marital status: Married    Spouse name: Not on file  . Number of children: Not on file  . Years of education: Not on file  . Highest education level: Not on file  Occupational History  . Not on file  Tobacco Use  . Smoking status: Light Tobacco Smoker    Types: Cigars  . Smokeless tobacco: Former Systems developer    Quit date: 1980  Substance and Sexual Activity  . Alcohol use: Not Currently    Comment: rare  . Drug use: Never  . Sexual activity: Yes  Other Topics Concern  . Not on file  Social History Narrative  . Not on file   Social Determinants of Health   Financial Resource Strain:   . Difficulty of Paying Living Expenses:   Food Insecurity:   . Worried About Charity fundraiser in the Last Year:   . Arboriculturist in the Last Year:   Transportation Needs:   . Film/video editor (Medical):   Marland Kitchen Lack of Transportation (Non-Medical):   Physical Activity:   . Days of Exercise per Week:   . Minutes of Exercise per Session:   Stress:   . Feeling of Stress :   Social Connections:   . Frequency of Communication with Friends and Family:   . Frequency of  Social Gatherings with Friends and Family:   . Attends Religious Services:   . Active Member of Clubs or Organizations:   . Attends Archivist Meetings:   Marland Kitchen Marital Status:   Intimate Partner Violence:   . Fear of Current or Ex-Partner:   . Emotionally Abused:   Marland Kitchen Physically Abused:   . Sexually Abused:     Family History  Problem Relation Age of Onset  . Lung cancer Father      Current Outpatient Medications:  .  dexamethasone (DECADRON) 4 MG tablet, Take 2 tablets (8 mg total) by mouth daily. Start the day after chemotherapy for 2 days. Take with food., Disp: 30 tablet, Rfl: 1 .  lidocaine-prilocaine (EMLA) cream, Apply to affected area once, Disp: 30 g, Rfl: 3 .  Loperamide HCl (IMODIUM PO), Take 2 mg by mouth 2 (two) times daily as needed (diarrhea or loose stools). , Disp: , Rfl:  .  loratadine (CLARITIN) 10 MG tablet, Take 10 mg by mouth daily. Just around chemo for 4 days, Disp: , Rfl:  .  Multiple Vitamin (MULTIVITAMIN) tablet, Take 1 tablet by mouth daily. Centrum, Disp: , Rfl:  .  ondansetron (ZOFRAN) 8 MG tablet, Take 1 tablet (8 mg total) by mouth 2 (two) times daily as  needed for refractory nausea / vomiting. Start on day 3 after chemotherapy. (Patient not taking: Reported on 07/07/2019), Disp: 30 tablet, Rfl: 1 .  prochlorperazine (COMPAZINE) 10 MG tablet, Take 1 tablet (10 mg total) by mouth every 6 (six) hours as needed (Nausea or vomiting). (Patient not taking: Reported on 05/19/2019), Disp: 30 tablet, Rfl: 1  Physical exam:  Vitals:   07/21/19 0859  BP: 101/70  Pulse: 67  Resp: 16  Temp: (!) 96.4 F (35.8 C)  TempSrc: Tympanic  Weight: 181 lb 4.8 oz (82.2 kg)  Height: '6\' 4"'  (1.93 m)   Physical Exam Skin:    General: Skin is warm and dry.  Neurological:     Mental Status: He is alert and oriented to person, place, and time.      CMP Latest Ref Rng & Units 07/21/2019  Glucose 70 - 99 mg/dL 120(H)  BUN 6 - 20 mg/dL 22(H)  Creatinine 0.61 - 1.24  mg/dL 1.03  Sodium 135 - 145 mmol/L 137  Potassium 3.5 - 5.1 mmol/L 4.0  Chloride 98 - 111 mmol/L 105  CO2 22 - 32 mmol/L 23  Calcium 8.9 - 10.3 mg/dL 8.8(L)  Total Protein 6.5 - 8.1 g/dL 6.7  Total Bilirubin 0.3 - 1.2 mg/dL 0.7  Alkaline Phos 38 - 126 U/L 96  AST 15 - 41 U/L 21  ALT 0 - 44 U/L 29   CBC Latest Ref Rng & Units 07/21/2019  WBC 4.0 - 10.5 K/uL 13.2(H)  Hemoglobin 13.0 - 17.0 g/dL 12.1(L)  Hematocrit 39.0 - 52.0 % 37.5(L)  Platelets 150 - 400 K/uL 174      Assessment and plan- Patient is a 60 y.o. 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 He is here for on treatment assessment prior to cycle 9 of adjuvant FOLFOX chemotherapy  Patient incidentally tested positive for Covid prior to the planned sigmoidoscopy. At this time the unable to give him chemotherapy unless he is 21 days out since he tested positive. I will therefore see him back in 2 weeks for cycle 9 of adjuvant FOLFOX chemotherapy  From Covid standpoint patient is doing well and has not had any significant symptoms other than mild sinus congestion. He has also received 1 dose of bamlanavamib   Visit Diagnosis 1. Cancer of sigmoid ([place])   2. Lab test positive for detection of COVID-19 virus      Dr. Randa Evens, MD, MPH [facility] [phone] 07/23/2019 9:09 AM

## 2019-07-28 NOTE — Progress Notes (Signed)

## 2019-08-04 ENCOUNTER — Inpatient Hospital Stay: Payer: No Typology Code available for payment source

## 2019-08-04 ENCOUNTER — Ambulatory Visit: Payer: 59

## 2019-08-04 ENCOUNTER — Other Ambulatory Visit: Payer: Self-pay

## 2019-08-04 ENCOUNTER — Inpatient Hospital Stay (HOSPITAL_BASED_OUTPATIENT_CLINIC_OR_DEPARTMENT_OTHER): Payer: No Typology Code available for payment source | Admitting: Oncology

## 2019-08-04 ENCOUNTER — Encounter: Payer: Self-pay | Admitting: Oncology

## 2019-08-04 ENCOUNTER — Ambulatory Visit: Payer: 59 | Admitting: Oncology

## 2019-08-04 ENCOUNTER — Other Ambulatory Visit: Payer: 59

## 2019-08-04 VITALS — BP 109/62 | HR 67 | Temp 96.4°F | Resp 16 | Wt 194.5 lb

## 2019-08-04 DIAGNOSIS — Z5111 Encounter for antineoplastic chemotherapy: Secondary | ICD-10-CM

## 2019-08-04 DIAGNOSIS — G620 Drug-induced polyneuropathy: Secondary | ICD-10-CM | POA: Diagnosis not present

## 2019-08-04 DIAGNOSIS — Z95828 Presence of other vascular implants and grafts: Secondary | ICD-10-CM

## 2019-08-04 DIAGNOSIS — C187 Malignant neoplasm of sigmoid colon: Secondary | ICD-10-CM

## 2019-08-04 DIAGNOSIS — D6481 Anemia due to antineoplastic chemotherapy: Secondary | ICD-10-CM

## 2019-08-04 DIAGNOSIS — T451X5A Adverse effect of antineoplastic and immunosuppressive drugs, initial encounter: Secondary | ICD-10-CM

## 2019-08-04 LAB — COMPREHENSIVE METABOLIC PANEL
ALT: 26 U/L (ref 0–44)
AST: 20 U/L (ref 15–41)
Albumin: 3.7 g/dL (ref 3.5–5.0)
Alkaline Phosphatase: 71 U/L (ref 38–126)
Anion gap: 6 (ref 5–15)
BUN: 11 mg/dL (ref 6–20)
CO2: 26 mmol/L (ref 22–32)
Calcium: 8.8 mg/dL — ABNORMAL LOW (ref 8.9–10.3)
Chloride: 107 mmol/L (ref 98–111)
Creatinine, Ser: 0.8 mg/dL (ref 0.61–1.24)
GFR calc Af Amer: >60 mL/min (ref >60)
GFR calc non Af Amer: >60 mL/min (ref >60)
Glucose, Bld: 102 mg/dL — ABNORMAL HIGH (ref 70–99)
Potassium: 4.1 mmol/L (ref 3.5–5.1)
Sodium: 139 mmol/L (ref 135–145)
Total Bilirubin: 0.9 mg/dL (ref 0.3–1.2)
Total Protein: 6.3 g/dL — ABNORMAL LOW (ref 6.5–8.1)

## 2019-08-04 LAB — CBC WITH DIFFERENTIAL/PLATELET
Abs Immature Granulocytes: 0.01 10*3/uL (ref 0.00–0.07)
Basophils Absolute: 0 10*3/uL (ref 0.0–0.1)
Basophils Relative: 1 %
Eosinophils Absolute: 0.1 10*3/uL (ref 0.0–0.5)
Eosinophils Relative: 1 %
HCT: 32.2 % — ABNORMAL LOW (ref 39.0–52.0)
Hemoglobin: 10.6 g/dL — ABNORMAL LOW (ref 13.0–17.0)
Immature Granulocytes: 0 %
Lymphocytes Relative: 16 %
Lymphs Abs: 0.8 10*3/uL (ref 0.7–4.0)
MCH: 32.1 pg (ref 26.0–34.0)
MCHC: 32.9 g/dL (ref 30.0–36.0)
MCV: 97.6 fL (ref 80.0–100.0)
Monocytes Absolute: 0.5 10*3/uL (ref 0.1–1.0)
Monocytes Relative: 9 %
Neutro Abs: 3.7 10*3/uL (ref 1.7–7.7)
Neutrophils Relative %: 73 %
Platelets: 115 10*3/uL — ABNORMAL LOW (ref 150–400)
RBC: 3.3 MIL/uL — ABNORMAL LOW (ref 4.22–5.81)
RDW: 14.7 % (ref 11.5–15.5)
WBC: 5.1 10*3/uL (ref 4.0–10.5)
nRBC: 0 % (ref 0.0–0.2)

## 2019-08-04 MED ORDER — SODIUM CHLORIDE 0.9 % IV SOLN
10.0000 mg | Freq: Once | INTRAVENOUS | Status: AC
  Administered 2019-08-04: 10 mg via INTRAVENOUS
  Filled 2019-08-04: qty 10

## 2019-08-04 MED ORDER — OXALIPLATIN CHEMO INJECTION 100 MG/20ML
65.0000 mg/m2 | Freq: Once | INTRAVENOUS | Status: AC
  Administered 2019-08-04: 140 mg via INTRAVENOUS
  Filled 2019-08-04: qty 10

## 2019-08-04 MED ORDER — LEUCOVORIN CALCIUM INJECTION 350 MG
850.0000 mg | Freq: Once | INTRAVENOUS | Status: AC
  Administered 2019-08-04: 850 mg via INTRAVENOUS
  Filled 2019-08-04: qty 25

## 2019-08-04 MED ORDER — SODIUM CHLORIDE 0.9 % IV SOLN
2400.0000 mg/m2 | INTRAVENOUS | Status: DC
  Administered 2019-08-04: 5200 mg via INTRAVENOUS
  Filled 2019-08-04: qty 104

## 2019-08-04 MED ORDER — PALONOSETRON HCL INJECTION 0.25 MG/5ML
0.2500 mg | Freq: Once | INTRAVENOUS | Status: AC
  Administered 2019-08-04: 0.25 mg via INTRAVENOUS
  Filled 2019-08-04: qty 5

## 2019-08-04 MED ORDER — DEXTROSE 5 % IV SOLN
Freq: Once | INTRAVENOUS | Status: AC
  Filled 2019-08-04: qty 250

## 2019-08-04 MED ORDER — SODIUM CHLORIDE 0.9% FLUSH
10.0000 mL | INTRAVENOUS | Status: DC | PRN
  Administered 2019-08-04: 10 mL via INTRAVENOUS
  Filled 2019-08-04: qty 10

## 2019-08-04 NOTE — Progress Notes (Signed)
Patient here for oncology follow-up appointment, expresses no complaints or concerns at this time.    

## 2019-08-05 NOTE — Progress Notes (Signed)
Hematology/Oncology Consult note [facility]  Telephone:([phone] [phone]  Patient Care Team: Kendell Bane, NP as PCP - General (Adult Health Nurse Practitioner) Clent Jacks, RN as Oncology Nurse Navigator   Name of the patient: Keith Trujillo  [id number]  Jul 11, 1960   Date of visit: 08/05/19  Diagnosis- stage IIIb colon adenocarcinoma   Chief complaint/ Reason for visit-on treatment assessment prior to cycle 10 of adjuvant FOLFOX chemotherapy  Heme/Onc history: patient is a 59 year old male who was seen by Dr. Remi Haggard GI for symptoms of diarrhea as well as abdominal pain.He underwent a colonoscopy on 01/02/2019 which showed an ulcerated partially obstructing large mass in the sigmoid colon which measured 10 cm in length biopsy showed adenocarcinoma moderately differentiated. Baseline CEA elevated at 71.7. He was seen by Dr. Dahlia Byes and will be undergoing laparoscopic hemicolectomy and lymph node sampling next week.  CT abdomen pelvis with contrast showed irregular marked circumferential wall thickening in the sigmoid colon near the rectosigmoid junction. The lesion longitudinally extends for approximately 5 to 6 cm. 4.1 x 2.8 cm ill-defined irregular paracolonic soft tissue mass may represent direct tumor extension or contiguous markedly enlarged lymph node. Abnormal lymph nodes also seen in the perirectal and presacral space concerning for metastatic disease. 1.5 cm ill-defined hypoattenuating lesion in the medial segment of the left liver which may be focal fatty deposition but cannot be definitively characterized.MRI did not show any evidence of liver metastases. CT chest was negative for metastatic disease.  Patient underwent a low anterior resection of the sigmoid mass. Final pathology showed 8 cm invasive colorectal adenocarcinoma grade 2. Tumor invades through the muscularis propria into pericolorectal tissue. All margins  are uninvolvedLymphovascular invasion and perineural invasion present. Lymph nodes 1+ out of 20 PT3PN1A. MSI stable  Adjuvant FOLFOX chemotherapy was started on 03/17/2019   Interval history-patient is currently doing well.  Bowel movements are regular.  Denies any fever cough or shortness of breath.  He has mild tingling numbness in his hands and feet which is overall stable and intermittent  ECOG PS- 0 Pain scale- 0   Review of systems- Review of Systems  Constitutional: Negative for chills, fever, malaise/fatigue and weight loss.  HENT: Negative for congestion, ear discharge and nosebleeds.   Eyes: Negative for blurred vision.  Respiratory: Negative for cough, hemoptysis, sputum production, shortness of breath and wheezing.   Cardiovascular: Negative for chest pain, palpitations, orthopnea and claudication.  Gastrointestinal: Negative for abdominal pain, blood in stool, constipation, diarrhea, heartburn, melena, nausea and vomiting.  Genitourinary: Negative for dysuria, flank pain, frequency, hematuria and urgency.  Musculoskeletal: Negative for back pain, joint pain and myalgias.  Skin: Negative for rash.  Neurological: Positive for sensory change (Peripheral neuropathy). Negative for dizziness, tingling, focal weakness, seizures, weakness and headaches.  Endo/Heme/Allergies: Does not bruise/bleed easily.  Psychiatric/Behavioral: Negative for depression and suicidal ideas. The patient does not have insomnia.       Allergies  Allergen Reactions  . Sulfa Antibiotics Rash     Past Medical History:  Diagnosis Date  . Abdominal pain   . Allergy   . Cancer ([place])   . Cellulitis   . Colon cancer ([place])   . Dyspnea   . Liver spot   . Loose stools   . Weight loss      Past Surgical History:  Procedure Laterality Date  . COLONOSCOPY WITH PROPOFOL N/A 01/02/2019   Procedure: COLONOSCOPY WITH BIOPSY;  Surgeon: [name], MD;  Location:  [place];  Service:  Endoscopy;  Laterality: N/A;  Clip x2 at Biopsy Site  . COLOSTOMY Right 02/06/2019   Procedure: COLOSTOMY;  Surgeon: [name], MD;  Location: [facility];  Service: General;  Laterality: Right;  . excision of skin lesion    . LAPAROSCOPIC SIGMOID COLECTOMY N/A 02/06/2019   Procedure: LAPAROSCOPIC SIGMOID COLECTOMY converted to open procedure;  Surgeon: [name], MD;  Location: [facility];  Service: General;  Laterality: N/A;  . PORTACATH PLACEMENT Right 03/06/2019   Procedure: INSERTION PORT-A-CATH;  Surgeon: [name], MD;  Location: [facility];  Service: General;  Laterality: Right;  . TONSILLECTOMY AND ADENOIDECTOMY  1969    Social History   Socioeconomic History  . Marital status: Married    Spouse name: Not on file  . Number of children: Not on file  . Years of education: Not on file  . Highest education level: Not on file  Occupational History  . Not on file  Tobacco Use  . Smoking status: Light Tobacco Smoker    Types: Cigars  . Smokeless tobacco: Former Systems developer    Quit date: 1980  Substance and Sexual Activity  . Alcohol use: Not Currently    Comment: rare  . Drug use: Never  . Sexual activity: Yes  Other Topics Concern  . Not on file  Social History Narrative  . Not on file   Social Determinants of Health   Financial Resource Strain:   . Difficulty of Paying Living Expenses:   Food Insecurity:   . Worried About Charity fundraiser in the Last Year:   . Arboriculturist in the Last Year:   Transportation Needs:   . Film/video editor (Medical):   Marland Kitchen Lack of Transportation (Non-Medical):   Physical Activity:   . Days of Exercise per Week:   . Minutes of Exercise per Session:   Stress:   . Feeling of Stress :   Social Connections:   . Frequency of Communication with Friends and Family:   . Frequency of Social Gatherings with Friends and Family:   . Attends Religious Services:   . Active Member of Clubs or Organizations:   . Attends Theatre manager Meetings:   Marland Kitchen Marital Status:   Intimate Partner Violence:   . Fear of Current or Ex-Partner:   . Emotionally Abused:   Marland Kitchen Physically Abused:   . Sexually Abused:     Family History  Problem Relation Age of Onset  . Lung cancer Father      Current Outpatient Medications:  .  lidocaine-prilocaine (EMLA) cream, Apply to affected area once, Disp: 30 g, Rfl: 3 .  Loperamide HCl (IMODIUM PO), Take 2 mg by mouth 2 (two) times daily as needed (diarrhea or loose stools). , Disp: , Rfl:  .  loratadine (CLARITIN) 10 MG tablet, Take 10 mg by mouth daily. Just around chemo for 4 days, Disp: , Rfl:  .  Multiple Vitamin (MULTIVITAMIN) tablet, Take 1 tablet by mouth daily. Centrum, Disp: , Rfl:  .  dexamethasone (DECADRON) 4 MG tablet, Take 2 tablets (8 mg total) by mouth daily. Start the day after chemotherapy for 2 days. Take with food. (Patient not taking: Reported on 08/04/2019), Disp: 30 tablet, Rfl: 1 .  ondansetron (ZOFRAN) 8 MG tablet, Take 1 tablet (8 mg total) by mouth 2 (two) times daily as needed for refractory nausea / vomiting. Start on day 3 after chemotherapy. (Patient not taking: Reported on  07/07/2019), Disp: 30 tablet, Rfl: 1 .  prochlorperazine (COMPAZINE) 10 MG tablet, Take 1 tablet (10 mg total) by mouth every 6 (six) hours as needed (Nausea or vomiting). (Patient not taking: Reported on 05/19/2019), Disp: 30 tablet, Rfl: 1  Physical exam:  Vitals:   08/04/19 0852 08/04/19 0855  BP: 109/62   Pulse: 67   Resp: 16   Temp:  (!) 96.4 F (35.8 C)  TempSrc: Tympanic   SpO2: 100%   Weight: 194 lb 8 oz (88.2 kg)    Physical Exam Constitutional:      General: He is not in acute distress. Cardiovascular:     Rate and Rhythm: Normal rate and regular rhythm.     Heart sounds: Normal heart sounds.  Pulmonary:     Effort: Pulmonary effort is normal.     Breath sounds: Normal breath sounds.  Abdominal:     General: Bowel sounds are normal.     Palpations: Abdomen is  soft.     Comments: Colostomy in place  Skin:    General: Skin is warm and dry.  Neurological:     Mental Status: He is alert and oriented to person, place, and time.      CMP Latest Ref Rng & Units 08/04/2019  Glucose 70 - 99 mg/dL 102(H)  BUN 6 - 20 mg/dL 11  Creatinine 0.61 - 1.24 mg/dL 0.80  Sodium 135 - 145 mmol/L 139  Potassium 3.5 - 5.1 mmol/L 4.1  Chloride 98 - 111 mmol/L 107  CO2 22 - 32 mmol/L 26  Calcium 8.9 - 10.3 mg/dL 8.8(L)  Total Protein 6.5 - 8.1 g/dL 6.3(L)  Total Bilirubin 0.3 - 1.2 mg/dL 0.9  Alkaline Phos 38 - 126 U/L 71  AST 15 - 41 U/L 20  ALT 0 - 44 U/L 26   CBC Latest Ref Rng & Units 08/04/2019  WBC 4.0 - 10.5 K/uL 5.1  Hemoglobin 13.0 - 17.0 g/dL 10.6(L)  Hematocrit 39.0 - 52.0 % 32.2(L)  Platelets 150 - 400 K/uL 115(L)     Assessment and plan- Patient is a 59 y.o. 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  He is here for on treatment assessment prior to cycle 9 of adjuvant FOLFOX chemotherapy  Counts okay to proceed with cycle 9 of FOLFOX chemotherapy today.  His platelet counts have remained more than 100 and I will proceed with oxaliplatin today as well.  I will see him back in 2 weeks time with CBC with differential, CMP for cycle 10 of 5-FU chemotherapy plus minus oxaliplatin based on his platelet counts.  He will receive Udenyca on day 3 of pump disconnect  Patient is a little more anemic today as compared to before.  His baseline hemoglobin runs between 11-12.  Possibly due to chemotherapy.  Continue to monitor.  We will add ferritin iron studies B12 and folate to next set of labs  Chemo-induced peripheral neuropathy: Mild grade 1 continue to monitor.  Oxaliplatin has been dose reduced to 65 mg per metered squared he has been getting that intermittently  Patient will be finishing his chemotherapy in 6 weeks and I would prefer for GI to wait for chemo to be completed before doing his sigmoidoscopy   Visit  Diagnosis 1. Encounter for antineoplastic chemotherapy   2. Cancer of sigmoid ([place])   3. Chemotherapy-induced peripheral neuropathy ([place])   4. Antineoplastic chemotherapy induced anemia      Dr. Randa Evens, MD, MPH [place] [facility]  [facility] [phone] 08/05/2019 9:50 AM

## 2019-08-06 ENCOUNTER — Inpatient Hospital Stay: Payer: No Typology Code available for payment source

## 2019-08-06 ENCOUNTER — Other Ambulatory Visit: Payer: Self-pay

## 2019-08-06 VITALS — BP 113/67 | HR 61 | Temp 97.0°F | Resp 18

## 2019-08-06 DIAGNOSIS — C187 Malignant neoplasm of sigmoid colon: Secondary | ICD-10-CM

## 2019-08-06 DIAGNOSIS — Z5111 Encounter for antineoplastic chemotherapy: Secondary | ICD-10-CM | POA: Diagnosis not present

## 2019-08-06 MED ORDER — HEPARIN SOD (PORK) LOCK FLUSH 100 UNIT/ML IV SOLN
500.0000 [IU] | Freq: Once | INTRAVENOUS | Status: AC | PRN
  Administered 2019-08-06: 500 [IU]
  Filled 2019-08-06: qty 5

## 2019-08-06 MED ORDER — PEGFILGRASTIM-CBQV 6 MG/0.6ML ~~LOC~~ SOSY
6.0000 mg | PREFILLED_SYRINGE | Freq: Once | SUBCUTANEOUS | Status: AC
  Administered 2019-08-06: 6 mg via SUBCUTANEOUS
  Filled 2019-08-06: qty 0.6

## 2019-08-06 MED ORDER — HEPARIN SOD (PORK) LOCK FLUSH 100 UNIT/ML IV SOLN
INTRAVENOUS | Status: AC
  Filled 2019-08-06: qty 5

## 2019-08-06 MED ORDER — SODIUM CHLORIDE 0.9% FLUSH
10.0000 mL | INTRAVENOUS | Status: DC | PRN
  Administered 2019-08-06: 10 mL
  Filled 2019-08-06: qty 10

## 2019-08-11 NOTE — Progress Notes (Signed)
Pharmacist Chemotherapy Monitoring - Follow Up Assessment    I verify that I have reviewed each item in the below checklist:  . Regimen for the patient is scheduled for the appropriate day and plan matches scheduled date. Marland Kitchen Appropriate non-routine labs are ordered dependent on drug ordered. . If applicable, additional medications reviewed and ordered per protocol based on lifetime cumulative doses and/or treatment regimen.   Plan for follow-up and/or issues identified: No . I-vent associated with next due treatment: No . MD and/or nursing notified: No  Keith Trujillo 08/11/2019 2:42 PM

## 2019-08-19 ENCOUNTER — Other Ambulatory Visit: Payer: Self-pay

## 2019-08-19 ENCOUNTER — Encounter: Payer: Self-pay | Admitting: Oncology

## 2019-08-19 ENCOUNTER — Inpatient Hospital Stay: Payer: No Typology Code available for payment source | Attending: Oncology

## 2019-08-19 ENCOUNTER — Inpatient Hospital Stay (HOSPITAL_BASED_OUTPATIENT_CLINIC_OR_DEPARTMENT_OTHER): Payer: No Typology Code available for payment source | Admitting: Oncology

## 2019-08-19 ENCOUNTER — Inpatient Hospital Stay: Payer: No Typology Code available for payment source

## 2019-08-19 VITALS — BP 141/70 | HR 68 | Temp 98.0°F | Resp 16

## 2019-08-19 VITALS — BP 112/69 | HR 77 | Temp 96.7°F | Resp 18 | Wt 196.2 lb

## 2019-08-19 DIAGNOSIS — G620 Drug-induced polyneuropathy: Secondary | ICD-10-CM

## 2019-08-19 DIAGNOSIS — Z933 Colostomy status: Secondary | ICD-10-CM | POA: Insufficient documentation

## 2019-08-19 DIAGNOSIS — D6959 Other secondary thrombocytopenia: Secondary | ICD-10-CM | POA: Insufficient documentation

## 2019-08-19 DIAGNOSIS — T451X5A Adverse effect of antineoplastic and immunosuppressive drugs, initial encounter: Secondary | ICD-10-CM | POA: Diagnosis not present

## 2019-08-19 DIAGNOSIS — C187 Malignant neoplasm of sigmoid colon: Secondary | ICD-10-CM | POA: Diagnosis not present

## 2019-08-19 DIAGNOSIS — Z5111 Encounter for antineoplastic chemotherapy: Secondary | ICD-10-CM

## 2019-08-19 DIAGNOSIS — Z5112 Encounter for antineoplastic immunotherapy: Secondary | ICD-10-CM | POA: Diagnosis present

## 2019-08-19 DIAGNOSIS — Z7689 Persons encountering health services in other specified circumstances: Secondary | ICD-10-CM | POA: Insufficient documentation

## 2019-08-19 DIAGNOSIS — R110 Nausea: Secondary | ICD-10-CM | POA: Insufficient documentation

## 2019-08-19 DIAGNOSIS — Z79899 Other long term (current) drug therapy: Secondary | ICD-10-CM | POA: Insufficient documentation

## 2019-08-19 DIAGNOSIS — Z87891 Personal history of nicotine dependence: Secondary | ICD-10-CM | POA: Diagnosis not present

## 2019-08-19 LAB — VITAMIN B12: Vitamin B-12: 2337 pg/mL — ABNORMAL HIGH (ref 180–914)

## 2019-08-19 LAB — CBC WITH DIFFERENTIAL/PLATELET
Abs Immature Granulocytes: 0.08 10*3/uL — ABNORMAL HIGH (ref 0.00–0.07)
Basophils Absolute: 0.1 10*3/uL (ref 0.0–0.1)
Basophils Relative: 0 %
Eosinophils Absolute: 0.1 10*3/uL (ref 0.0–0.5)
Eosinophils Relative: 1 %
HCT: 35.6 % — ABNORMAL LOW (ref 39.0–52.0)
Hemoglobin: 11.7 g/dL — ABNORMAL LOW (ref 13.0–17.0)
Immature Granulocytes: 1 %
Lymphocytes Relative: 9 %
Lymphs Abs: 1.1 10*3/uL (ref 0.7–4.0)
MCH: 32.3 pg (ref 26.0–34.0)
MCHC: 32.9 g/dL (ref 30.0–36.0)
MCV: 98.3 fL (ref 80.0–100.0)
Monocytes Absolute: 0.6 10*3/uL (ref 0.1–1.0)
Monocytes Relative: 5 %
Neutro Abs: 9.7 10*3/uL — ABNORMAL HIGH (ref 1.7–7.7)
Neutrophils Relative %: 84 %
Platelets: 114 10*3/uL — ABNORMAL LOW (ref 150–400)
RBC: 3.62 MIL/uL — ABNORMAL LOW (ref 4.22–5.81)
RDW: 14.9 % (ref 11.5–15.5)
WBC: 11.5 10*3/uL — ABNORMAL HIGH (ref 4.0–10.5)
nRBC: 0 % (ref 0.0–0.2)

## 2019-08-19 LAB — IRON AND TIBC
Iron: 71 ug/dL (ref 45–182)
Saturation Ratios: 22 % (ref 17.9–39.5)
TIBC: 319 ug/dL (ref 250–450)
UIBC: 248 ug/dL

## 2019-08-19 LAB — COMPREHENSIVE METABOLIC PANEL
ALT: 29 U/L (ref 0–44)
AST: 26 U/L (ref 15–41)
Albumin: 4.1 g/dL (ref 3.5–5.0)
Alkaline Phosphatase: 107 U/L (ref 38–126)
Anion gap: 8 (ref 5–15)
BUN: 10 mg/dL (ref 6–20)
CO2: 24 mmol/L (ref 22–32)
Calcium: 8.7 mg/dL — ABNORMAL LOW (ref 8.9–10.3)
Chloride: 106 mmol/L (ref 98–111)
Creatinine, Ser: 0.74 mg/dL (ref 0.61–1.24)
GFR calc Af Amer: >60 mL/min (ref >60)
GFR calc non Af Amer: >60 mL/min (ref >60)
Glucose, Bld: 106 mg/dL — ABNORMAL HIGH (ref 70–99)
Potassium: 4 mmol/L (ref 3.5–5.1)
Sodium: 138 mmol/L (ref 135–145)
Total Bilirubin: 0.6 mg/dL (ref 0.3–1.2)
Total Protein: 6.6 g/dL (ref 6.5–8.1)

## 2019-08-19 LAB — FOLATE: Folate: 56.7 ng/mL (ref >5.9)

## 2019-08-19 LAB — FERRITIN: Ferritin: 117 ng/mL (ref 24–336)

## 2019-08-19 MED ORDER — HEPARIN SOD (PORK) LOCK FLUSH 100 UNIT/ML IV SOLN
INTRAVENOUS | Status: AC
  Filled 2019-08-19: qty 5

## 2019-08-19 MED ORDER — LEUCOVORIN CALCIUM INJECTION 350 MG
850.0000 mg | Freq: Once | INTRAVENOUS | Status: AC
  Administered 2019-08-19: 850 mg via INTRAVENOUS
  Filled 2019-08-19: qty 25

## 2019-08-19 MED ORDER — SODIUM CHLORIDE 0.9 % IV SOLN
10.0000 mg | Freq: Once | INTRAVENOUS | Status: AC
  Administered 2019-08-19: 10 mg via INTRAVENOUS
  Filled 2019-08-19: qty 10

## 2019-08-19 MED ORDER — SODIUM CHLORIDE 0.9% FLUSH
10.0000 mL | INTRAVENOUS | Status: DC | PRN
  Filled 2019-08-19: qty 10

## 2019-08-19 MED ORDER — PALONOSETRON HCL INJECTION 0.25 MG/5ML
0.2500 mg | Freq: Once | INTRAVENOUS | Status: AC
  Administered 2019-08-19: 0.25 mg via INTRAVENOUS
  Filled 2019-08-19: qty 5

## 2019-08-19 MED ORDER — OXALIPLATIN CHEMO INJECTION 100 MG/20ML
65.0000 mg/m2 | Freq: Once | INTRAVENOUS | Status: AC
  Administered 2019-08-19: 140 mg via INTRAVENOUS
  Filled 2019-08-19: qty 20

## 2019-08-19 MED ORDER — DEXTROSE 5 % IV SOLN
Freq: Once | INTRAVENOUS | Status: AC
  Filled 2019-08-19: qty 250

## 2019-08-19 MED ORDER — SODIUM CHLORIDE 0.9 % IV SOLN
2400.0000 mg/m2 | INTRAVENOUS | Status: DC
  Administered 2019-08-19: 5200 mg via INTRAVENOUS
  Filled 2019-08-19: qty 104

## 2019-08-19 MED ORDER — SODIUM CHLORIDE 0.9% FLUSH
10.0000 mL | Freq: Once | INTRAVENOUS | Status: AC
  Administered 2019-08-19: 10 mL via INTRAVENOUS
  Filled 2019-08-19: qty 10

## 2019-08-19 NOTE — Progress Notes (Signed)
No concerns at this time. `

## 2019-08-21 ENCOUNTER — Inpatient Hospital Stay: Payer: No Typology Code available for payment source

## 2019-08-21 ENCOUNTER — Other Ambulatory Visit: Payer: Self-pay

## 2019-08-21 DIAGNOSIS — C187 Malignant neoplasm of sigmoid colon: Secondary | ICD-10-CM

## 2019-08-21 DIAGNOSIS — Z5112 Encounter for antineoplastic immunotherapy: Secondary | ICD-10-CM | POA: Diagnosis not present

## 2019-08-21 MED ORDER — SODIUM CHLORIDE 0.9% FLUSH
10.0000 mL | INTRAVENOUS | Status: DC | PRN
  Administered 2019-08-21: 10 mL
  Filled 2019-08-21: qty 10

## 2019-08-21 MED ORDER — PEGFILGRASTIM-CBQV 6 MG/0.6ML ~~LOC~~ SOSY
6.0000 mg | PREFILLED_SYRINGE | Freq: Once | SUBCUTANEOUS | Status: AC
  Administered 2019-08-21: 6 mg via SUBCUTANEOUS
  Filled 2019-08-21: qty 0.6

## 2019-08-21 MED ORDER — HEPARIN SOD (PORK) LOCK FLUSH 100 UNIT/ML IV SOLN
500.0000 [IU] | Freq: Once | INTRAVENOUS | Status: AC | PRN
  Administered 2019-08-21: 500 [IU]
  Filled 2019-08-21: qty 5

## 2019-08-21 NOTE — Progress Notes (Signed)
Hematology/Oncology Consult note [facility]  [phone] [phone]  Patient Care Team: Kendell Bane, NP as PCP - General (Adult Health Nurse Practitioner) Clent Jacks, RN as Oncology Nurse Navigator Sindy Guadeloupe, MD as Consulting Physician (Oncology)   Name of the patient: Keith Trujillo  [id number]  Sep 13, 1960   Date of visit: 08/21/19  Diagnosis- stage IIIb colon adenocarcinoma  Chief complaint/ Reason for visit-on treatment assessment prior to cycle 10 of adjuvant FOLFOX chemotherapy  Heme/Onc history: patient is a 59 year old male who was seen by Dr. Remi Haggard GI for symptoms of diarrhea as well as abdominal pain.He underwent a colonoscopy on 01/02/2019 which showed an ulcerated partially obstructing large mass in the sigmoid colon which measured 10 cm in length biopsy showed adenocarcinoma moderately differentiated. Baseline CEA elevated at 71.7. He was seen by Dr. Dahlia Byes and will be undergoing laparoscopic hemicolectomy and lymph node sampling next week.  CT abdomen pelvis with contrast showed irregular marked circumferential wall thickening in the sigmoid colon near the rectosigmoid junction. The lesion longitudinally extends for approximately 5 to 6 cm. 4.1 x 2.8 cm ill-defined irregular paracolonic soft tissue mass may represent direct tumor extension or contiguous markedly enlarged lymph node. Abnormal lymph nodes also seen in the perirectal and presacral space concerning for metastatic disease. 1.5 cm ill-defined hypoattenuating lesion in the medial segment of the left liver which may be focal fatty deposition but cannot be definitively characterized.MRI did not show any evidence of liver metastases. CT chest was negative for metastatic disease.  Patient underwent a low anterior resection of the sigmoid mass. Final pathology showed 8 cm invasive colorectal adenocarcinoma grade 2. Tumor invades through the  muscularis propria into pericolorectal tissue. All margins are uninvolvedLymphovascular invasion and perineural invasion present. Lymph nodes 1+ out of 20 PT3PN1A. MSI stable  Adjuvant FOLFOX chemotherapy was started on 03/17/2019    Interval history-patient is tolerating chemotherapy well.  Denies any nausea or vomiting.  Has a colostomy in place and bowel movements have been fairly regular.  Has mild tingling numbness in his bilateral extremities which is overall stable.  ECOG PS- 1 Pain scale- 0   Review of systems- Review of Systems  Constitutional: Negative for chills, fever, malaise/fatigue and weight loss.  HENT: Negative for congestion, ear discharge and nosebleeds.   Eyes: Negative for blurred vision.  Respiratory: Negative for cough, hemoptysis, sputum production, shortness of breath and wheezing.   Cardiovascular: Negative for chest pain, palpitations, orthopnea and claudication.  Gastrointestinal: Negative for abdominal pain, blood in stool, constipation, diarrhea, heartburn, melena, nausea and vomiting.  Genitourinary: Negative for dysuria, flank pain, frequency, hematuria and urgency.  Musculoskeletal: Negative for back pain, joint pain and myalgias.  Skin: Negative for rash.  Neurological: Positive for sensory change (Peripheral neuropathy). Negative for dizziness, tingling, focal weakness, seizures, weakness and headaches.  Endo/Heme/Allergies: Does not bruise/bleed easily.  Psychiatric/Behavioral: Negative for depression and suicidal ideas. The patient does not have insomnia.       Allergies  Allergen Reactions  . Sulfa Antibiotics Rash     Past Medical History:  Diagnosis Date  . Abdominal pain   . Allergy   . Cancer ([place])   . Cellulitis   . Colon cancer ([place])   . Dyspnea   . Liver spot   . Loose stools   . Weight loss      Past Surgical History:  Procedure Laterality Date  . COLONOSCOPY WITH PROPOFOL N/A 01/02/2019   Procedure: COLONOSCOPY  WITH BIOPSY;  Surgeon: [name], MD;  Location: [place];  Service: Endoscopy;  Laterality: N/A;  Clip x2 at Biopsy Site  . COLOSTOMY Right 02/06/2019   Procedure: COLOSTOMY;  Surgeon: [name], MD;  Location: [facility];  Service: General;  Laterality: Right;  . excision of skin lesion    . LAPAROSCOPIC SIGMOID COLECTOMY N/A 02/06/2019   Procedure: LAPAROSCOPIC SIGMOID COLECTOMY converted to open procedure;  Surgeon: [name], MD;  Location: [facility];  Service: General;  Laterality: N/A;  . PORTACATH PLACEMENT Right 03/06/2019   Procedure: INSERTION PORT-A-CATH;  Surgeon: [name], MD;  Location: [facility];  Service: General;  Laterality: Right;  . TONSILLECTOMY AND ADENOIDECTOMY  1969    Social History   Socioeconomic History  . Marital status: Married    Spouse name: Not on file  . Number of children: Not on file  . Years of education: Not on file  . Highest education level: Not on file  Occupational History  . Not on file  Tobacco Use  . Smoking status: Light Tobacco Smoker    Types: Cigars  . Smokeless tobacco: Former Systems developer    Quit date: 1980  Substance and Sexual Activity  . Alcohol use: Not Currently    Comment: rare  . Drug use: Never  . Sexual activity: Yes  Other Topics Concern  . Not on file  Social History Narrative  . Not on file   Social Determinants of Health   Financial Resource Strain:   . Difficulty of Paying Living Expenses:   Food Insecurity:   . Worried About Charity fundraiser in the Last Year:   . Arboriculturist in the Last Year:   Transportation Needs:   . Film/video editor (Medical):   Marland Kitchen Lack of Transportation (Non-Medical):   Physical Activity:   . Days of Exercise per Week:   . Minutes of Exercise per Session:   Stress:   . Feeling of Stress :   Social Connections:   . Frequency of Communication with Friends and Family:   . Frequency of Social Gatherings with Friends and Family:   . Attends Religious  Services:   . Active Member of Clubs or Organizations:   . Attends Archivist Meetings:   Marland Kitchen Marital Status:   Intimate Partner Violence:   . Fear of Current or Ex-Partner:   . Emotionally Abused:   Marland Kitchen Physically Abused:   . Sexually Abused:     Family History  Problem Relation Age of Onset  . Lung cancer Father      Current Outpatient Medications:  .  dexamethasone (DECADRON) 4 MG tablet, Take 2 tablets (8 mg total) by mouth daily. Start the day after chemotherapy for 2 days. Take with food., Disp: 30 tablet, Rfl: 1 .  lidocaine-prilocaine (EMLA) cream, Apply to affected area once, Disp: 30 g, Rfl: 3 .  Loperamide HCl (IMODIUM PO), Take 2 mg by mouth 2 (two) times daily as needed (diarrhea or loose stools). , Disp: , Rfl:  .  loratadine (CLARITIN) 10 MG tablet, Take 10 mg by mouth daily. Just around chemo for 4 days, Disp: , Rfl:  .  Multiple Vitamin (MULTIVITAMIN) tablet, Take 1 tablet by mouth daily. Centrum, Disp: , Rfl:  .  ondansetron (ZOFRAN) 8 MG tablet, Take 1 tablet (8 mg total) by mouth 2 (two) times daily as needed for refractory nausea / vomiting. Start on day 3 after chemotherapy., Disp: 30 tablet,  Rfl: 1 .  prochlorperazine (COMPAZINE) 10 MG tablet, Take 1 tablet (10 mg total) by mouth every 6 (six) hours as needed (Nausea or vomiting)., Disp: 30 tablet, Rfl: 1  Physical exam:  Vitals:   08/19/19 0955  BP: 112/69  Pulse: 77  Resp: 18  Temp: (!) 96.7 F (35.9 C)  TempSrc: Oral  SpO2: 100%  Weight: 196 lb 3.2 oz (89 kg)   Physical Exam Constitutional:      General: He is not in acute distress. Cardiovascular:     Rate and Rhythm: Normal rate and regular rhythm.     Heart sounds: Normal heart sounds.  Pulmonary:     Effort: Pulmonary effort is normal.     Breath sounds: Normal breath sounds.  Abdominal:     General: Bowel sounds are normal.     Palpations: Abdomen is soft.  Skin:    General: Skin is warm and dry.  Neurological:     Mental  Status: He is alert and oriented to person, place, and time.      CMP Latest Ref Rng & Units 08/19/2019  Glucose 70 - 99 mg/dL 106(H)  BUN 6 - 20 mg/dL 10  Creatinine 0.61 - 1.24 mg/dL 0.74  Sodium 135 - 145 mmol/L 138  Potassium 3.5 - 5.1 mmol/L 4.0  Chloride 98 - 111 mmol/L 106  CO2 22 - 32 mmol/L 24  Calcium 8.9 - 10.3 mg/dL 8.7(L)  Total Protein 6.5 - 8.1 g/dL 6.6  Total Bilirubin 0.3 - 1.2 mg/dL 0.6  Alkaline Phos 38 - 126 U/L 107  AST 15 - 41 U/L 26  ALT 0 - 44 U/L 29   CBC Latest Ref Rng & Units 08/19/2019  WBC 4.0 - 10.5 K/uL 11.5(H)  Hemoglobin 13.0 - 17.0 g/dL 11.7(L)  Hematocrit 39.0 - 52.0 % 35.6(L)  Platelets 150 - 400 K/uL 114(L)      Assessment and plan- Patient is a 59 y.o. 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   He is here for on treatment assessment prior to cycle 10 of adjuvant FOLFOX chemotherapy  Counts okay to proceed with cycle 10 of adjuvant FOLFOX chemotherapy today.  His platelet count is 114 today and he will proceed with oxaliplatin today at 65 mg per metered square. I will see him back in 2 weeks for cycle 11.  He does receive Udenyca on day 3 of pump disconnect.   Chemo-induced peripheral neuropathy: Currently mild and stable.  Continue to monitor.  Normocytic anemia: Likely secondary to chemotherapy.  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  Presently platelet counts remain greater than 100.  Continue to monitor   Visit Diagnosis 1. Encounter for antineoplastic chemotherapy   2. Chemotherapy-induced thrombocytopenia   3. Chemotherapy-induced peripheral neuropathy ([place])   4. Cancer of sigmoid [facility])      Dr. Randa Evens, MD, MPH [facility] [phone] 08/21/2019 8:13 AM

## 2019-09-02 ENCOUNTER — Inpatient Hospital Stay (HOSPITAL_BASED_OUTPATIENT_CLINIC_OR_DEPARTMENT_OTHER): Payer: No Typology Code available for payment source | Admitting: Oncology

## 2019-09-02 ENCOUNTER — Inpatient Hospital Stay: Payer: No Typology Code available for payment source

## 2019-09-02 ENCOUNTER — Other Ambulatory Visit: Payer: Self-pay

## 2019-09-02 ENCOUNTER — Encounter: Payer: Self-pay | Admitting: Oncology

## 2019-09-02 VITALS — BP 127/71 | HR 64 | Temp 98.0°F | Resp 18 | Wt 196.0 lb

## 2019-09-02 DIAGNOSIS — T451X5A Adverse effect of antineoplastic and immunosuppressive drugs, initial encounter: Secondary | ICD-10-CM

## 2019-09-02 DIAGNOSIS — C187 Malignant neoplasm of sigmoid colon: Secondary | ICD-10-CM | POA: Diagnosis not present

## 2019-09-02 DIAGNOSIS — D6959 Other secondary thrombocytopenia: Secondary | ICD-10-CM

## 2019-09-02 DIAGNOSIS — G620 Drug-induced polyneuropathy: Secondary | ICD-10-CM

## 2019-09-02 DIAGNOSIS — Z5111 Encounter for antineoplastic chemotherapy: Secondary | ICD-10-CM

## 2019-09-02 DIAGNOSIS — Z95828 Presence of other vascular implants and grafts: Secondary | ICD-10-CM

## 2019-09-02 DIAGNOSIS — Z5112 Encounter for antineoplastic immunotherapy: Secondary | ICD-10-CM | POA: Diagnosis not present

## 2019-09-02 LAB — CBC WITH DIFFERENTIAL/PLATELET
Abs Immature Granulocytes: 0.17 10*3/uL — ABNORMAL HIGH (ref 0.00–0.07)
Basophils Absolute: 0 10*3/uL (ref 0.0–0.1)
Basophils Relative: 0 %
Eosinophils Absolute: 0.1 10*3/uL (ref 0.0–0.5)
Eosinophils Relative: 1 %
HCT: 34.9 % — ABNORMAL LOW (ref 39.0–52.0)
Hemoglobin: 11.5 g/dL — ABNORMAL LOW (ref 13.0–17.0)
Immature Granulocytes: 2 %
Lymphocytes Relative: 10 %
Lymphs Abs: 1 10*3/uL (ref 0.7–4.0)
MCH: 32.1 pg (ref 26.0–34.0)
MCHC: 33 g/dL (ref 30.0–36.0)
MCV: 97.5 fL (ref 80.0–100.0)
Monocytes Absolute: 0.7 10*3/uL (ref 0.1–1.0)
Monocytes Relative: 6 %
Neutro Abs: 8.7 10*3/uL — ABNORMAL HIGH (ref 1.7–7.7)
Neutrophils Relative %: 81 %
Platelets: 102 10*3/uL — ABNORMAL LOW (ref 150–400)
RBC: 3.58 MIL/uL — ABNORMAL LOW (ref 4.22–5.81)
RDW: 14.9 % (ref 11.5–15.5)
WBC: 10.8 10*3/uL — ABNORMAL HIGH (ref 4.0–10.5)
nRBC: 0 % (ref 0.0–0.2)

## 2019-09-02 LAB — COMPREHENSIVE METABOLIC PANEL
ALT: 25 U/L (ref 0–44)
AST: 22 U/L (ref 15–41)
Albumin: 3.9 g/dL (ref 3.5–5.0)
Alkaline Phosphatase: 115 U/L (ref 38–126)
Anion gap: 6 (ref 5–15)
BUN: 13 mg/dL (ref 6–20)
CO2: 26 mmol/L (ref 22–32)
Calcium: 9.1 mg/dL (ref 8.9–10.3)
Chloride: 107 mmol/L (ref 98–111)
Creatinine, Ser: 0.76 mg/dL (ref 0.61–1.24)
GFR calc Af Amer: >60 mL/min (ref >60)
GFR calc non Af Amer: >60 mL/min (ref >60)
Glucose, Bld: 126 mg/dL — ABNORMAL HIGH (ref 70–99)
Potassium: 4 mmol/L (ref 3.5–5.1)
Sodium: 139 mmol/L (ref 135–145)
Total Bilirubin: 0.7 mg/dL (ref 0.3–1.2)
Total Protein: 6.3 g/dL — ABNORMAL LOW (ref 6.5–8.1)

## 2019-09-02 MED ORDER — SODIUM CHLORIDE 0.9% FLUSH
10.0000 mL | INTRAVENOUS | Status: DC | PRN
  Administered 2019-09-02: 10 mL
  Filled 2019-09-02: qty 10

## 2019-09-02 MED ORDER — SODIUM CHLORIDE 0.9 % IV SOLN
10.0000 mg | Freq: Once | INTRAVENOUS | Status: AC
  Administered 2019-09-02: 10 mg via INTRAVENOUS
  Filled 2019-09-02: qty 10

## 2019-09-02 MED ORDER — SODIUM CHLORIDE 0.9% FLUSH
10.0000 mL | INTRAVENOUS | Status: DC | PRN
  Administered 2019-09-02: 10 mL via INTRAVENOUS
  Filled 2019-09-02: qty 10

## 2019-09-02 MED ORDER — PALONOSETRON HCL INJECTION 0.25 MG/5ML
0.2500 mg | Freq: Once | INTRAVENOUS | Status: AC
  Administered 2019-09-02: 0.25 mg via INTRAVENOUS
  Filled 2019-09-02: qty 5

## 2019-09-02 MED ORDER — OXALIPLATIN CHEMO INJECTION 100 MG/20ML
65.0000 mg/m2 | Freq: Once | INTRAVENOUS | Status: AC
  Administered 2019-09-02: 140 mg via INTRAVENOUS
  Filled 2019-09-02: qty 20

## 2019-09-02 MED ORDER — DEXTROSE 5 % IV SOLN
Freq: Once | INTRAVENOUS | Status: AC
  Filled 2019-09-02: qty 250

## 2019-09-02 MED ORDER — LEUCOVORIN CALCIUM INJECTION 350 MG
850.0000 mg | Freq: Once | INTRAVENOUS | Status: AC
  Administered 2019-09-02: 850 mg via INTRAVENOUS
  Filled 2019-09-02: qty 25

## 2019-09-02 MED ORDER — SODIUM CHLORIDE 0.9 % IV SOLN
2400.0000 mg/m2 | INTRAVENOUS | Status: DC
  Administered 2019-09-02: 5200 mg via INTRAVENOUS
  Filled 2019-09-02: qty 104

## 2019-09-02 NOTE — Progress Notes (Signed)
Patient here for oncology follow-up appointment, expresses no complaints or concerns at this time.    

## 2019-09-03 LAB — CEA: CEA: 1.7 ng/mL (ref 0.0–4.7)

## 2019-09-04 ENCOUNTER — Inpatient Hospital Stay: Payer: No Typology Code available for payment source

## 2019-09-04 ENCOUNTER — Other Ambulatory Visit: Payer: Self-pay

## 2019-09-04 VITALS — BP 118/64 | HR 62 | Temp 98.4°F

## 2019-09-04 DIAGNOSIS — C187 Malignant neoplasm of sigmoid colon: Secondary | ICD-10-CM

## 2019-09-04 DIAGNOSIS — Z5112 Encounter for antineoplastic immunotherapy: Secondary | ICD-10-CM | POA: Diagnosis not present

## 2019-09-04 MED ORDER — SODIUM CHLORIDE 0.9% FLUSH
10.0000 mL | INTRAVENOUS | Status: DC | PRN
  Administered 2019-09-04: 10 mL
  Filled 2019-09-04: qty 10

## 2019-09-04 MED ORDER — HEPARIN SOD (PORK) LOCK FLUSH 100 UNIT/ML IV SOLN
INTRAVENOUS | Status: AC
  Filled 2019-09-04: qty 5

## 2019-09-04 MED ORDER — PEGFILGRASTIM-CBQV 6 MG/0.6ML ~~LOC~~ SOSY
6.0000 mg | PREFILLED_SYRINGE | Freq: Once | SUBCUTANEOUS | Status: AC
  Administered 2019-09-04: 6 mg via SUBCUTANEOUS
  Filled 2019-09-04: qty 0.6

## 2019-09-04 MED ORDER — HEPARIN SOD (PORK) LOCK FLUSH 100 UNIT/ML IV SOLN
500.0000 [IU] | Freq: Once | INTRAVENOUS | Status: AC | PRN
  Administered 2019-09-04: 500 [IU]
  Filled 2019-09-04: qty 5

## 2019-09-04 NOTE — Progress Notes (Signed)
Hematology/Oncology Consult note [facility]  Telephone:([phone] [phone]  Patient Care Team: Kendell Bane, NP as PCP - General (Adult Health Nurse Practitioner) Clent Jacks, RN as Oncology Nurse Navigator Sindy Guadeloupe, MD as Consulting Physician (Oncology)   Name of the patient: Keith Trujillo  [id number]  1960-08-09   Date of visit: 09/04/19  Diagnosis- stage IIIb colon adenocarcinoma   Chief complaint/ Reason for visit-on treatment assessment prior to cycle 11 of adjuvant FOLFOX chemotherapy  Heme/Onc history: patient is a 59 year old male who was seen by Dr. Remi Haggard GI for symptoms of diarrhea as well as abdominal pain.He underwent a colonoscopy on 01/02/2019 which showed an ulcerated partially obstructing large mass in the sigmoid colon which measured 10 cm in length biopsy showed adenocarcinoma moderately differentiated. Baseline CEA elevated at 71.7. He was seen by Dr. Dahlia Byes and will be undergoing laparoscopic hemicolectomy and lymph node sampling next week.  CT abdomen pelvis with contrast showed irregular marked circumferential wall thickening in the sigmoid colon near the rectosigmoid junction. The lesion longitudinally extends for approximately 5 to 6 cm. 4.1 x 2.8 cm ill-defined irregular paracolonic soft tissue mass may represent direct tumor extension or contiguous markedly enlarged lymph node. Abnormal lymph nodes also seen in the perirectal and presacral space concerning for metastatic disease. 1.5 cm ill-defined hypoattenuating lesion in the medial segment of the left liver which may be focal fatty deposition but cannot be definitively characterized.MRI did not show any evidence of liver metastases. CT chest was negative for metastatic disease.  Patient underwent a low anterior resection of the sigmoid mass. Final pathology showed 8 cm invasive colorectal adenocarcinoma grade 2. Tumor invades through the  muscularis propria into pericolorectal tissue. All margins are uninvolvedLymphovascular invasion and perineural invasion present. Lymph nodes 1+ out of 20 PT3PN1A. MSI stable  Adjuvant FOLFOX chemotherapy was started on 03/17/2019   Interval history-patient feels well.  Denies any significant nausea vomiting.  Denies any bleeding or bruising.  Has mild tingling numbness in his hands and feet which is intermittent.1  ECOG PS- 1 Pain scale- 0  Review of systems- Review of Systems  Constitutional: Positive for malaise/fatigue. Negative for chills, fever and weight loss.  HENT: Negative for congestion, ear discharge and nosebleeds.   Eyes: Negative for blurred vision.  Respiratory: Negative for cough, hemoptysis, sputum production, shortness of breath and wheezing.   Cardiovascular: Negative for chest pain, palpitations, orthopnea and claudication.  Gastrointestinal: Negative for abdominal pain, blood in stool, constipation, diarrhea, heartburn, melena, nausea and vomiting.  Genitourinary: Negative for dysuria, flank pain, frequency, hematuria and urgency.  Musculoskeletal: Negative for back pain, joint pain and myalgias.  Skin: Negative for rash.  Neurological: Positive for sensory change (Peripheral neuropathy). Negative for dizziness, tingling, focal weakness, seizures, weakness and headaches.  Endo/Heme/Allergies: Does not bruise/bleed easily.  Psychiatric/Behavioral: Negative for depression and suicidal ideas. The patient does not have insomnia.       Allergies  Allergen Reactions  . Sulfa Antibiotics Rash     Past Medical History:  Diagnosis Date  . Abdominal pain   . Allergy   . Cancer ([place])   . Cellulitis   . Colon cancer ([place])   . Dyspnea   . Liver spot   . Loose stools   . Weight loss      Past Surgical History:  Procedure Laterality Date  . COLONOSCOPY WITH PROPOFOL N/A 01/02/2019   Procedure: COLONOSCOPY WITH BIOPSY;  Surgeon: [name], MD;  Location:  [place];  Service: Endoscopy;  Laterality: N/A;  Clip x2 at Biopsy Site  . COLOSTOMY Right 02/06/2019   Procedure: COLOSTOMY;  Surgeon: [name], MD;  Location: [facility];  Service: General;  Laterality: Right;  . excision of skin lesion    . LAPAROSCOPIC SIGMOID COLECTOMY N/A 02/06/2019   Procedure: LAPAROSCOPIC SIGMOID COLECTOMY converted to open procedure;  Surgeon: [name], MD;  Location: [facility];  Service: General;  Laterality: N/A;  . PORTACATH PLACEMENT Right 03/06/2019   Procedure: INSERTION PORT-A-CATH;  Surgeon: [name], MD;  Location: [facility];  Service: General;  Laterality: Right;  . TONSILLECTOMY AND ADENOIDECTOMY  1969    Social History   Socioeconomic History  . Marital status: Married    Spouse name: Not on file  . Number of children: Not on file  . Years of education: Not on file  . Highest education level: Not on file  Occupational History  . Not on file  Tobacco Use  . Smoking status: Light Tobacco Smoker    Types: Cigars  . Smokeless tobacco: Former Systems developer    Quit date: Comptroller  . Vaping Use: Never used  Substance and Sexual Activity  . Alcohol use: Not Currently    Comment: rare  . Drug use: Never  . Sexual activity: Yes  Other Topics Concern  . Not on file  Social History Narrative  . Not on file   Social Determinants of Health   Financial Resource Strain:   . Difficulty of Paying Living Expenses:   Food Insecurity:   . Worried About Charity fundraiser in the Last Year:   . Arboriculturist in the Last Year:   Transportation Needs:   . Film/video editor (Medical):   Marland Kitchen Lack of Transportation (Non-Medical):   Physical Activity:   . Days of Exercise per Week:   . Minutes of Exercise per Session:   Stress:   . Feeling of Stress :   Social Connections:   . Frequency of Communication with Friends and Family:   . Frequency of Social Gatherings with Friends and Family:   . Attends Religious Services:     . Active Member of Clubs or Organizations:   . Attends Archivist Meetings:   Marland Kitchen Marital Status:   Intimate Partner Violence:   . Fear of Current or Ex-Partner:   . Emotionally Abused:   Marland Kitchen Physically Abused:   . Sexually Abused:     Family History  Problem Relation Age of Onset  . Lung cancer Father      Current Outpatient Medications:  .  Loperamide HCl (IMODIUM PO), Take 2 mg by mouth 2 (two) times daily as needed (diarrhea or loose stools). , Disp: , Rfl:  .  loratadine (CLARITIN) 10 MG tablet, Take 10 mg by mouth daily. Just around chemo for 4 days, Disp: , Rfl:  .  Multiple Vitamin (MULTIVITAMIN) tablet, Take 1 tablet by mouth daily. Centrum, Disp: , Rfl:  .  dexamethasone (DECADRON) 4 MG tablet, Take 2 tablets (8 mg total) by mouth daily. Start the day after chemotherapy for 2 days. Take with food. (Patient not taking: Reported on 09/02/2019), Disp: 30 tablet, Rfl: 1 .  lidocaine-prilocaine (EMLA) cream, Apply to affected area once (Patient not taking: Reported on 09/02/2019), Disp: 30 g, Rfl: 3 .  ondansetron (ZOFRAN) 8 MG tablet, Take 1 tablet (8 mg total) by mouth 2 (two) times daily as  needed for refractory nausea / vomiting. Start on day 3 after chemotherapy. (Patient not taking: Reported on 09/02/2019), Disp: 30 tablet, Rfl: 1 .  prochlorperazine (COMPAZINE) 10 MG tablet, Take 1 tablet (10 mg total) by mouth every 6 (six) hours as needed (Nausea or vomiting). (Patient not taking: Reported on 09/02/2019), Disp: 30 tablet, Rfl: 1 No current facility-administered medications for this visit.  Facility-Administered Medications Ordered in Other Visits:  .  sodium chloride flush (NS) 0.9 % injection 10 mL, 10 mL, Intravenous, PRN, Sindy Guadeloupe, MD, 10 mL at 09/02/19 0832  Physical exam:  Vitals:   09/02/19 0908  BP: 127/71  Pulse: 64  Resp: 18  Temp: 98 F (36.7 C)  TempSrc: Oral  SpO2: 100%  Weight: 196 lb (88.9 kg)   Physical Exam Cardiovascular:     Rate  and Rhythm: Normal rate and regular rhythm.     Heart sounds: Normal heart sounds.  Pulmonary:     Effort: Pulmonary effort is normal.     Breath sounds: Normal breath sounds.  Abdominal:     General: Bowel sounds are normal.     Palpations: Abdomen is soft.     Comments: Colostomy in place  Skin:    General: Skin is warm and dry.  Neurological:     Mental Status: He is alert and oriented to person, place, and time.      CMP Latest Ref Rng & Units 09/02/2019  Glucose 70 - 99 mg/dL 126(H)  BUN 6 - 20 mg/dL 13  Creatinine 0.61 - 1.24 mg/dL 0.76  Sodium 135 - 145 mmol/L 139  Potassium 3.5 - 5.1 mmol/L 4.0  Chloride 98 - 111 mmol/L 107  CO2 22 - 32 mmol/L 26  Calcium 8.9 - 10.3 mg/dL 9.1  Total Protein 6.5 - 8.1 g/dL 6.3(L)  Total Bilirubin 0.3 - 1.2 mg/dL 0.7  Alkaline Phos 38 - 126 U/L 115  AST 15 - 41 U/L 22  ALT 0 - 44 U/L 25   CBC Latest Ref Rng & Units 09/02/2019  WBC 4.0 - 10.5 K/uL 10.8(H)  Hemoglobin 13.0 - 17.0 g/dL 11.5(L)  Hematocrit 39 - 52 % 34.9(L)  Platelets 150 - 400 K/uL 102(L)     Assessment and plan- Patient is a 59 y.o. 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   He is here for on treatment assessment prior to cycle 11 of adjuvant FOLFOX chemotherapy  Counts okay to proceed with cycle 11 of adjuvant FOLFOX chemotherapy today.  He will be receiving oxaliplatin today at 65 mg per metered squared.  I will see him back in 2 weeks with CBC with differential, CMP for cycle 12 of FOLFOX chemotherapy which will be his last cycle.  If his platelet counts at that time are less than 100 he will not receive oxaliplatin.  Patient also comes back on day 3 for pump disconnect and receives Udenyca  I will plan to repeat CT chest abdomen and pelvis with contrast after 12 cycles  Chemo-induced peripheral neuropathy: Mild.  Grade 1 continue to monitor  Chemo-induced thrombocytopenia: Platelet counts currently stable in the 100s to  110s.  Continue to monitor   Visit Diagnosis 1. Chemotherapy-induced thrombocytopenia   2. Chemotherapy-induced peripheral neuropathy (HCC)   3. Cancer of sigmoid ([place])   4. Encounter for antineoplastic chemotherapy      Dr. Randa Evens, MD, MPH [facility] [phone] 09/04/2019 1:27 PM

## 2019-09-16 ENCOUNTER — Inpatient Hospital Stay (HOSPITAL_BASED_OUTPATIENT_CLINIC_OR_DEPARTMENT_OTHER): Payer: No Typology Code available for payment source | Admitting: Oncology

## 2019-09-16 ENCOUNTER — Inpatient Hospital Stay: Payer: No Typology Code available for payment source

## 2019-09-16 ENCOUNTER — Telehealth: Payer: Self-pay

## 2019-09-16 ENCOUNTER — Other Ambulatory Visit: Payer: Self-pay

## 2019-09-16 ENCOUNTER — Encounter: Payer: Self-pay | Admitting: Oncology

## 2019-09-16 VITALS — BP 113/65 | HR 62 | Temp 96.3°F | Resp 16 | Wt 197.3 lb

## 2019-09-16 DIAGNOSIS — T451X5A Adverse effect of antineoplastic and immunosuppressive drugs, initial encounter: Secondary | ICD-10-CM

## 2019-09-16 DIAGNOSIS — D6959 Other secondary thrombocytopenia: Secondary | ICD-10-CM

## 2019-09-16 DIAGNOSIS — Z5111 Encounter for antineoplastic chemotherapy: Secondary | ICD-10-CM | POA: Diagnosis not present

## 2019-09-16 DIAGNOSIS — C187 Malignant neoplasm of sigmoid colon: Secondary | ICD-10-CM

## 2019-09-16 DIAGNOSIS — G620 Drug-induced polyneuropathy: Secondary | ICD-10-CM

## 2019-09-16 DIAGNOSIS — Z5112 Encounter for antineoplastic immunotherapy: Secondary | ICD-10-CM | POA: Diagnosis not present

## 2019-09-16 LAB — COMPREHENSIVE METABOLIC PANEL
ALT: 25 U/L (ref 0–44)
AST: 21 U/L (ref 15–41)
Albumin: 3.9 g/dL (ref 3.5–5.0)
Alkaline Phosphatase: 127 U/L — ABNORMAL HIGH (ref 38–126)
Anion gap: 6 (ref 5–15)
BUN: 17 mg/dL (ref 6–20)
CO2: 25 mmol/L (ref 22–32)
Calcium: 8.9 mg/dL (ref 8.9–10.3)
Chloride: 106 mmol/L (ref 98–111)
Creatinine, Ser: 0.86 mg/dL (ref 0.61–1.24)
GFR calc Af Amer: >60 mL/min (ref >60)
GFR calc non Af Amer: >60 mL/min (ref >60)
Glucose, Bld: 104 mg/dL — ABNORMAL HIGH (ref 70–99)
Potassium: 4.2 mmol/L (ref 3.5–5.1)
Sodium: 137 mmol/L (ref 135–145)
Total Bilirubin: 0.5 mg/dL (ref 0.3–1.2)
Total Protein: 6.6 g/dL (ref 6.5–8.1)

## 2019-09-16 LAB — CBC WITH DIFFERENTIAL/PLATELET
Abs Immature Granulocytes: 0.32 10*3/uL — ABNORMAL HIGH (ref 0.00–0.07)
Basophils Absolute: 0.1 10*3/uL (ref 0.0–0.1)
Basophils Relative: 0 %
Eosinophils Absolute: 0.1 10*3/uL (ref 0.0–0.5)
Eosinophils Relative: 1 %
HCT: 37.3 % — ABNORMAL LOW (ref 39.0–52.0)
Hemoglobin: 12.3 g/dL — ABNORMAL LOW (ref 13.0–17.0)
Immature Granulocytes: 2 %
Lymphocytes Relative: 8 %
Lymphs Abs: 1.2 10*3/uL (ref 0.7–4.0)
MCH: 32.4 pg (ref 26.0–34.0)
MCHC: 33 g/dL (ref 30.0–36.0)
MCV: 98.2 fL (ref 80.0–100.0)
Monocytes Absolute: 1 10*3/uL (ref 0.1–1.0)
Monocytes Relative: 7 %
Neutro Abs: 11.4 10*3/uL — ABNORMAL HIGH (ref 1.7–7.7)
Neutrophils Relative %: 82 %
Platelets: 109 10*3/uL — ABNORMAL LOW (ref 150–400)
RBC: 3.8 MIL/uL — ABNORMAL LOW (ref 4.22–5.81)
RDW: 14.8 % (ref 11.5–15.5)
WBC: 14.1 10*3/uL — ABNORMAL HIGH (ref 4.0–10.5)
nRBC: 0 % (ref 0.0–0.2)

## 2019-09-16 MED ORDER — HEPARIN SOD (PORK) LOCK FLUSH 100 UNIT/ML IV SOLN
500.0000 [IU] | Freq: Once | INTRAVENOUS | Status: DC
  Filled 2019-09-16: qty 5

## 2019-09-16 MED ORDER — PROCHLORPERAZINE MALEATE 10 MG PO TABS: 10.0000 mg | ORAL_TABLET | Freq: Four times a day (QID) | ORAL | 1 refills | Status: DC | PRN

## 2019-09-16 MED ORDER — PALONOSETRON HCL INJECTION 0.25 MG/5ML
0.2500 mg | Freq: Once | INTRAVENOUS | Status: AC
  Administered 2019-09-16: 0.25 mg via INTRAVENOUS
  Filled 2019-09-16: qty 5

## 2019-09-16 MED ORDER — SODIUM CHLORIDE 0.9% FLUSH
10.0000 mL | Freq: Once | INTRAVENOUS | Status: AC
  Administered 2019-09-16: 10 mL via INTRAVENOUS
  Filled 2019-09-16: qty 10

## 2019-09-16 MED ORDER — SODIUM CHLORIDE 0.9% FLUSH
10.0000 mL | INTRAVENOUS | Status: DC | PRN
  Filled 2019-09-16: qty 10

## 2019-09-16 MED ORDER — SODIUM CHLORIDE 0.9 % IV SOLN
2400.0000 mg/m2 | INTRAVENOUS | Status: DC
  Administered 2019-09-16: 5200 mg via INTRAVENOUS
  Filled 2019-09-16: qty 104

## 2019-09-16 MED ORDER — SODIUM CHLORIDE 0.9 % IV SOLN
10.0000 mg | Freq: Once | INTRAVENOUS | Status: AC
  Administered 2019-09-16: 10 mg via INTRAVENOUS
  Filled 2019-09-16: qty 10

## 2019-09-16 MED ORDER — DEXTROSE 5 % IV SOLN
Freq: Once | INTRAVENOUS | Status: AC
  Filled 2019-09-16: qty 250

## 2019-09-16 MED ORDER — OXALIPLATIN CHEMO INJECTION 100 MG/20ML
65.0000 mg/m2 | Freq: Once | INTRAVENOUS | Status: AC
  Administered 2019-09-16: 140 mg via INTRAVENOUS
  Filled 2019-09-16: qty 20

## 2019-09-16 MED ORDER — LEUCOVORIN CALCIUM INJECTION 350 MG
850.0000 mg | Freq: Once | INTRAVENOUS | Status: AC
  Administered 2019-09-16: 850 mg via INTRAVENOUS
  Filled 2019-09-16: qty 42.5

## 2019-09-16 NOTE — Progress Notes (Signed)
Hematology/Oncology Consult note [facility]  Telephone:([phone] [phone]  Patient Care Team: Kendell Bane, NP as PCP - General (Adult Health Nurse Practitioner) Clent Jacks, RN as Oncology Nurse Navigator Sindy Guadeloupe, MD as Consulting Physician (Oncology)   Name of the patient: Keith Trujillo  [id number]  08/27/1960   Date of visit: 09/16/19  Diagnosis- stage IIIb colon adenocarcinoma  Chief complaint/ Reason for visit-on treatment assessment prior to cycle 12 of adjuvant FOLFOX chemotherapy  Heme/Onc history: patient is a 59 year old male who was seen by Dr. Remi Haggard GI for symptoms of diarrhea as well as abdominal pain.He underwent a colonoscopy on 01/02/2019 which showed an ulcerated partially obstructing large mass in the sigmoid colon which measured 10 cm in length biopsy showed adenocarcinoma moderately differentiated. Baseline CEA elevated at 71.7. He was seen by Dr. Dahlia Byes and will be undergoing laparoscopic hemicolectomy and lymph node sampling next week.  CT abdomen pelvis with contrast showed irregular marked circumferential wall thickening in the sigmoid colon near the rectosigmoid junction. The lesion longitudinally extends for approximately 5 to 6 cm. 4.1 x 2.8 cm ill-defined irregular paracolonic soft tissue mass may represent direct tumor extension or contiguous markedly enlarged lymph node. Abnormal lymph nodes also seen in the perirectal and presacral space concerning for metastatic disease. 1.5 cm ill-defined hypoattenuating lesion in the medial segment of the left liver which may be focal fatty deposition but cannot be definitively characterized.MRI did not show any evidence of liver metastases. CT chest was negative for metastatic disease.  Patient underwent a low anterior resection of the sigmoid mass. Final pathology showed 8 cm invasive colorectal adenocarcinoma grade 2. Tumor invades through the  muscularis propria into pericolorectal tissue. All margins are uninvolvedLymphovascular invasion and perineural invasion present. Lymph nodes 1+ out of 20 PT3PN1A. MSI stable  Adjuvant FOLFOX chemotherapy was started on 03/17/2019    Interval history-he is doing well overall.  Tingling numbness in his hands and feet is overall mild.  Also had some self-limited nausea for which she used Compazine ECOG PS- 1 Pain scale- 0   Review of systems- Review of Systems  Constitutional: Negative for chills, fever, malaise/fatigue and weight loss.  HENT: Negative for congestion, ear discharge and nosebleeds.   Eyes: Negative for blurred vision.  Respiratory: Negative for cough, hemoptysis, sputum production, shortness of breath and wheezing.   Cardiovascular: Negative for chest pain, palpitations, orthopnea and claudication.  Gastrointestinal: Positive for nausea. Negative for abdominal pain, blood in stool, constipation, diarrhea, heartburn, melena and vomiting.  Genitourinary: Negative for dysuria, flank pain, frequency, hematuria and urgency.  Musculoskeletal: Negative for back pain, joint pain and myalgias.  Skin: Negative for rash.  Neurological: Positive for sensory change (Peripheral neuropathy). Negative for dizziness, tingling, focal weakness, seizures, weakness and headaches.  Endo/Heme/Allergies: Does not bruise/bleed easily.  Psychiatric/Behavioral: Negative for depression and suicidal ideas. The patient does not have insomnia.        Allergies  Allergen Reactions  . Sulfa Antibiotics Rash     Past Medical History:  Diagnosis Date  . Abdominal pain   . Allergy   . Cancer ([place])   . Cellulitis   . Colon cancer ([place])   . Dyspnea   . Liver spot   . Loose stools   . Weight loss      Past Surgical History:  Procedure Laterality Date  . COLONOSCOPY WITH PROPOFOL N/A 01/02/2019   Procedure: COLONOSCOPY WITH BIOPSY;  Surgeon: [name], MD;  Location: [place];  Service: Endoscopy;  Laterality: N/A;  Clip x2 at Biopsy Site  . COLOSTOMY Right 02/06/2019   Procedure: COLOSTOMY;  Surgeon: [name], MD;  Location: [facility];  Service: General;  Laterality: Right;  . excision of skin lesion    . LAPAROSCOPIC SIGMOID COLECTOMY N/A 02/06/2019   Procedure: LAPAROSCOPIC SIGMOID COLECTOMY converted to open procedure;  Surgeon: [name], MD;  Location: [facility];  Service: General;  Laterality: N/A;  . PORTACATH PLACEMENT Right 03/06/2019   Procedure: INSERTION PORT-A-CATH;  Surgeon: [name], MD;  Location: [facility];  Service: General;  Laterality: Right;  . TONSILLECTOMY AND ADENOIDECTOMY  1969    Social History   Socioeconomic History  . Marital status: Married    Spouse name: Not on file  . Number of children: Not on file  . Years of education: Not on file  . Highest education level: Not on file  Occupational History  . Not on file  Tobacco Use  . Smoking status: Light Tobacco Smoker    Types: Cigars  . Smokeless tobacco: Former Systems developer    Quit date: Comptroller  . Vaping Use: Never used  Substance and Sexual Activity  . Alcohol use: Not Currently    Comment: rare  . Drug use: Never  . Sexual activity: Yes  Other Topics Concern  . Not on file  Social History Narrative  . Not on file   Social Determinants of Health   Financial Resource Strain:   . Difficulty of Paying Living Expenses:   Food Insecurity:   . Worried About Charity fundraiser in the Last Year:   . Arboriculturist in the Last Year:   Transportation Needs:   . Film/video editor (Medical):   Marland Kitchen Lack of Transportation (Non-Medical):   Physical Activity:   . Days of Exercise per Week:   . Minutes of Exercise per Session:   Stress:   . Feeling of Stress :   Social Connections:   . Frequency of Communication with Friends and Family:   . Frequency of Social Gatherings with Friends and Family:   . Attends Religious Services:   . Active  Member of Clubs or Organizations:   . Attends Archivist Meetings:   Marland Kitchen Marital Status:   Intimate Partner Violence:   . Fear of Current or Ex-Partner:   . Emotionally Abused:   Marland Kitchen Physically Abused:   . Sexually Abused:     Family History  Problem Relation Age of Onset  . Lung cancer Father      Current Outpatient Medications:  .  dexamethasone (DECADRON) 4 MG tablet, Take 2 tablets (8 mg total) by mouth daily. Start the day after chemotherapy for 2 days. Take with food. (Patient not taking: Reported on 09/02/2019), Disp: 30 tablet, Rfl: 1 .  lidocaine-prilocaine (EMLA) cream, Apply to affected area once (Patient not taking: Reported on 09/02/2019), Disp: 30 g, Rfl: 3 .  Loperamide HCl (IMODIUM PO), Take 2 mg by mouth 2 (two) times daily as needed (diarrhea or loose stools). , Disp: , Rfl:  .  loratadine (CLARITIN) 10 MG tablet, Take 10 mg by mouth daily. Just around chemo for 4 days, Disp: , Rfl:  .  Multiple Vitamin (MULTIVITAMIN) tablet, Take 1 tablet by mouth daily. Centrum, Disp: , Rfl:  .  ondansetron (ZOFRAN) 8 MG tablet, Take 1 tablet (8 mg total) by mouth 2 (two) times daily as needed  for refractory nausea / vomiting. Start on day 3 after chemotherapy. (Patient not taking: Reported on 09/02/2019), Disp: 30 tablet, Rfl: 1 .  prochlorperazine (COMPAZINE) 10 MG tablet, Take 1 tablet (10 mg total) by mouth every 6 (six) hours as needed (Nausea or vomiting). (Patient not taking: Reported on 09/02/2019), Disp: 30 tablet, Rfl: 1 No current facility-administered medications for this visit.  Facility-Administered Medications Ordered in Other Visits:  .  heparin lock flush 100 unit/mL, 500 Units, Intravenous, Once, Randa Evens C, MD .  sodium chloride flush (NS) 0.9 % injection 10 mL, 10 mL, Intravenous, PRN, Sindy Guadeloupe, MD, 10 mL at 09/02/19 0832  Physical exam:  Vitals:   09/16/19 0835  BP: 113/65  Pulse: 62  Resp: 16  Temp: (!) 96.3 F (35.7 C)  TempSrc: Tympanic    SpO2: 100%  Weight: 197 lb 4.8 oz (89.5 kg)   Physical Exam Cardiovascular:     Rate and Rhythm: Normal rate and regular rhythm.     Heart sounds: Normal heart sounds.  Pulmonary:     Effort: Pulmonary effort is normal.     Breath sounds: Normal breath sounds.  Abdominal:     General: Bowel sounds are normal.     Palpations: Abdomen is soft.     Comments: Colostomy in place  Skin:    General: Skin is warm and dry.  Neurological:     Mental Status: He is alert and oriented to person, place, and time.      CMP Latest Ref Rng & Units 09/16/2019  Glucose 70 - 99 mg/dL 104(H)  BUN 6 - 20 mg/dL 17  Creatinine 0.61 - 1.24 mg/dL 0.86  Sodium 135 - 145 mmol/L 137  Potassium 3.5 - 5.1 mmol/L 4.2  Chloride 98 - 111 mmol/L 106  CO2 22 - 32 mmol/L 25  Calcium 8.9 - 10.3 mg/dL 8.9  Total Protein 6.5 - 8.1 g/dL 6.6  Total Bilirubin 0.3 - 1.2 mg/dL 0.5  Alkaline Phos 38 - 126 U/L 127(H)  AST 15 - 41 U/L 21  ALT 0 - 44 U/L 25   CBC Latest Ref Rng & Units 09/16/2019  WBC 4.0 - 10.5 K/uL 14.1(H)  Hemoglobin 13.0 - 17.0 g/dL 12.3(L)  Hematocrit 39 - 52 % 37.3(L)  Platelets 150 - 400 K/uL 109(L)    Assessment and plan- Patient is a 59 y.o. 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 He is here for on treatment assessment prior to cycle 12 of adjuvant FOLFOX chemotherapy  Counts okay to proceed with cycle 12 of adjuvant FOLFOX chemotherapy today.  This would be his last chemo.  Overall he has tolerated well other than mild peripheral neuropathy and mild thrombocytopenia secondary to oxaliplatin.  Thrombocytopenia: Secondary to oxaliplatin.  This is his last chemotherapy.  Continue to monitor  Chemo-induced peripheral neuropathy: Mild grade 1 he is not on any medications at this time.  Continue to monitor  I will get repeat CT chest abdomen and pelvis with contrast at this time and see him in 2 weeks time  Patient can get his colostomy takedown as  well as port taken out by Dr. Dahlia Byes   Visit Diagnosis 1. Chemotherapy-induced peripheral neuropathy ([place])   2. Chemotherapy-induced thrombocytopenia   3. Encounter for antineoplastic chemotherapy   4. Cancer of sigmoid [facility])      Dr. Randa Evens, MD, MPH [facility] [phone] 09/16/2019 8:21 AM

## 2019-09-16 NOTE — Telephone Encounter (Signed)
Left message for patient to return call to office- patient scheduled for 09/24/19 @ 4pm to discuss Colostomy takedown and port removal per Dr.Pabon.

## 2019-09-16 NOTE — Addendum Note (Signed)
Addended by: Sindy Guadeloupe on: 09/16/2019 09:17 AM   Modules accepted: Orders

## 2019-09-17 LAB — CEA: CEA: 1.5 ng/mL (ref 0.0–4.7)

## 2019-09-18 ENCOUNTER — Inpatient Hospital Stay: Payer: No Typology Code available for payment source | Attending: Oncology

## 2019-09-18 ENCOUNTER — Other Ambulatory Visit: Payer: Self-pay

## 2019-09-18 VITALS — BP 132/69 | HR 66 | Temp 98.5°F | Resp 18

## 2019-09-18 DIAGNOSIS — R109 Unspecified abdominal pain: Secondary | ICD-10-CM | POA: Insufficient documentation

## 2019-09-18 DIAGNOSIS — C187 Malignant neoplasm of sigmoid colon: Secondary | ICD-10-CM | POA: Diagnosis present

## 2019-09-18 DIAGNOSIS — R0602 Shortness of breath: Secondary | ICD-10-CM | POA: Diagnosis not present

## 2019-09-18 DIAGNOSIS — R911 Solitary pulmonary nodule: Secondary | ICD-10-CM | POA: Diagnosis not present

## 2019-09-18 DIAGNOSIS — F17210 Nicotine dependence, cigarettes, uncomplicated: Secondary | ICD-10-CM | POA: Diagnosis not present

## 2019-09-18 DIAGNOSIS — R599 Enlarged lymph nodes, unspecified: Secondary | ICD-10-CM | POA: Insufficient documentation

## 2019-09-18 DIAGNOSIS — L03115 Cellulitis of right lower limb: Secondary | ICD-10-CM | POA: Diagnosis not present

## 2019-09-18 DIAGNOSIS — Z9221 Personal history of antineoplastic chemotherapy: Secondary | ICD-10-CM | POA: Insufficient documentation

## 2019-09-18 DIAGNOSIS — Z5189 Encounter for other specified aftercare: Secondary | ICD-10-CM | POA: Diagnosis not present

## 2019-09-18 DIAGNOSIS — I700 Atherosclerosis of aorta: Secondary | ICD-10-CM | POA: Diagnosis not present

## 2019-09-18 DIAGNOSIS — Z79899 Other long term (current) drug therapy: Secondary | ICD-10-CM | POA: Insufficient documentation

## 2019-09-18 DIAGNOSIS — D696 Thrombocytopenia, unspecified: Secondary | ICD-10-CM | POA: Diagnosis not present

## 2019-09-18 DIAGNOSIS — R634 Abnormal weight loss: Secondary | ICD-10-CM | POA: Insufficient documentation

## 2019-09-18 DIAGNOSIS — C189 Malignant neoplasm of colon, unspecified: Secondary | ICD-10-CM

## 2019-09-18 MED ORDER — PEGFILGRASTIM-CBQV 6 MG/0.6ML ~~LOC~~ SOSY
6.0000 mg | PREFILLED_SYRINGE | Freq: Once | SUBCUTANEOUS | Status: AC
  Administered 2019-09-18: 6 mg via SUBCUTANEOUS

## 2019-09-18 MED ORDER — HEPARIN SOD (PORK) LOCK FLUSH 100 UNIT/ML IV SOLN
500.0000 [IU] | Freq: Once | INTRAVENOUS | Status: AC | PRN
  Administered 2019-09-18: 500 [IU]
  Filled 2019-09-18: qty 5

## 2019-09-18 MED ORDER — SODIUM CHLORIDE 0.9% FLUSH
10.0000 mL | INTRAVENOUS | Status: DC | PRN
  Administered 2019-09-18: 10 mL
  Filled 2019-09-18: qty 10

## 2019-09-24 ENCOUNTER — Ambulatory Visit: Payer: Self-pay | Admitting: Surgery

## 2019-09-25 ENCOUNTER — Other Ambulatory Visit: Payer: Self-pay

## 2019-09-25 ENCOUNTER — Ambulatory Visit
Admission: RE | Admit: 2019-09-25 | Discharge: 2019-09-25 | Disposition: A | Discharge status: Home / Self Care | Payer: No Typology Code available for payment source | Source: Ambulatory Visit | Attending: Oncology | Admitting: Oncology

## 2019-09-25 ENCOUNTER — Ambulatory Visit: Payer: No Typology Code available for payment source

## 2019-09-25 DIAGNOSIS — T451X5A Adverse effect of antineoplastic and immunosuppressive drugs, initial encounter: Secondary | ICD-10-CM | POA: Diagnosis present

## 2019-09-25 DIAGNOSIS — G620 Drug-induced polyneuropathy: Secondary | ICD-10-CM | POA: Insufficient documentation

## 2019-09-25 DIAGNOSIS — D6959 Other secondary thrombocytopenia: Secondary | ICD-10-CM | POA: Diagnosis present

## 2019-09-25 DIAGNOSIS — C187 Malignant neoplasm of sigmoid colon: Secondary | ICD-10-CM | POA: Diagnosis present

## 2019-09-25 DIAGNOSIS — Z5111 Encounter for antineoplastic chemotherapy: Secondary | ICD-10-CM | POA: Diagnosis present

## 2019-09-25 MED ORDER — IOHEXOL 300 MG/ML  SOLN
100.0000 mL | Freq: Once | INTRAMUSCULAR | Status: AC | PRN
  Administered 2019-09-25: 100 mL via INTRAVENOUS

## 2019-09-30 ENCOUNTER — Inpatient Hospital Stay: Payer: No Typology Code available for payment source

## 2019-09-30 ENCOUNTER — Inpatient Hospital Stay (HOSPITAL_BASED_OUTPATIENT_CLINIC_OR_DEPARTMENT_OTHER): Payer: No Typology Code available for payment source | Admitting: Oncology

## 2019-09-30 ENCOUNTER — Other Ambulatory Visit: Payer: Self-pay

## 2019-09-30 VITALS — BP 117/64 | HR 73 | Temp 97.8°F | Resp 18 | Wt 202.6 lb

## 2019-09-30 DIAGNOSIS — Z08 Encounter for follow-up examination after completed treatment for malignant neoplasm: Secondary | ICD-10-CM

## 2019-09-30 DIAGNOSIS — C187 Malignant neoplasm of sigmoid colon: Secondary | ICD-10-CM

## 2019-09-30 DIAGNOSIS — L03115 Cellulitis of right lower limb: Secondary | ICD-10-CM

## 2019-09-30 DIAGNOSIS — D696 Thrombocytopenia, unspecified: Secondary | ICD-10-CM

## 2019-09-30 DIAGNOSIS — Z85038 Personal history of other malignant neoplasm of large intestine: Secondary | ICD-10-CM

## 2019-09-30 DIAGNOSIS — Z95828 Presence of other vascular implants and grafts: Secondary | ICD-10-CM

## 2019-09-30 LAB — CBC WITH DIFFERENTIAL/PLATELET
Abs Immature Granulocytes: 0.12 10*3/uL — ABNORMAL HIGH (ref 0.00–0.07)
Basophils Absolute: 0.1 10*3/uL (ref 0.0–0.1)
Basophils Relative: 0 %
Eosinophils Absolute: 0.1 10*3/uL (ref 0.0–0.5)
Eosinophils Relative: 1 %
HCT: 33.7 % — ABNORMAL LOW (ref 39.0–52.0)
Hemoglobin: 11.5 g/dL — ABNORMAL LOW (ref 13.0–17.0)
Immature Granulocytes: 1 %
Lymphocytes Relative: 5 %
Lymphs Abs: 0.8 10*3/uL (ref 0.7–4.0)
MCH: 32.8 pg (ref 26.0–34.0)
MCHC: 34.1 g/dL (ref 30.0–36.0)
MCV: 96 fL (ref 80.0–100.0)
Monocytes Absolute: 1 10*3/uL (ref 0.1–1.0)
Monocytes Relative: 7 %
Neutro Abs: 13.3 10*3/uL — ABNORMAL HIGH (ref 1.7–7.7)
Neutrophils Relative %: 86 %
Platelets: 108 10*3/uL — ABNORMAL LOW (ref 150–400)
RBC: 3.51 MIL/uL — ABNORMAL LOW (ref 4.22–5.81)
RDW: 15.2 % (ref 11.5–15.5)
WBC: 15.4 10*3/uL — ABNORMAL HIGH (ref 4.0–10.5)
nRBC: 0 % (ref 0.0–0.2)

## 2019-09-30 LAB — COMPREHENSIVE METABOLIC PANEL
ALT: 29 U/L (ref 0–44)
AST: 24 U/L (ref 15–41)
Albumin: 4.2 g/dL (ref 3.5–5.0)
Alkaline Phosphatase: 125 U/L (ref 38–126)
Anion gap: 7 (ref 5–15)
BUN: 16 mg/dL (ref 6–20)
CO2: 26 mmol/L (ref 22–32)
Calcium: 8.8 mg/dL — ABNORMAL LOW (ref 8.9–10.3)
Chloride: 104 mmol/L (ref 98–111)
Creatinine, Ser: 0.77 mg/dL (ref 0.61–1.24)
GFR calc Af Amer: >60 mL/min (ref >60)
GFR calc non Af Amer: >60 mL/min (ref >60)
Glucose, Bld: 109 mg/dL — ABNORMAL HIGH (ref 70–99)
Potassium: 4.1 mmol/L (ref 3.5–5.1)
Sodium: 137 mmol/L (ref 135–145)
Total Bilirubin: 0.6 mg/dL (ref 0.3–1.2)
Total Protein: 6.6 g/dL (ref 6.5–8.1)

## 2019-09-30 MED ORDER — HEPARIN SOD (PORK) LOCK FLUSH 100 UNIT/ML IV SOLN
INTRAVENOUS | Status: AC
  Filled 2019-09-30: qty 5

## 2019-09-30 MED ORDER — SODIUM CHLORIDE 0.9% FLUSH
10.0000 mL | Freq: Once | INTRAVENOUS | Status: AC
  Administered 2019-09-30: 10 mL via INTRAVENOUS
  Filled 2019-09-30: qty 10

## 2019-09-30 MED ORDER — DOXYCYCLINE HYCLATE 100 MG PO TABS
100.0000 mg | ORAL_TABLET | Freq: Two times a day (BID) | ORAL | 0 refills | Status: DC
Start: 2019-09-30 — End: 2019-11-17

## 2019-09-30 MED ORDER — HEPARIN SOD (PORK) LOCK FLUSH 100 UNIT/ML IV SOLN
500.0000 [IU] | Freq: Once | INTRAVENOUS | Status: AC
  Administered 2019-09-30: 500 [IU] via INTRAVENOUS
  Filled 2019-09-30: qty 5

## 2019-09-30 NOTE — Progress Notes (Signed)
Hematology/Oncology Consult note [facility]  [phone] [phone]  Patient Care Team: Kendell Bane, NP as PCP - General (Adult Health Nurse Practitioner) Clent Jacks, RN as Oncology Nurse Navigator Sindy Guadeloupe, MD as Consulting Physician (Oncology)   Name of the patient: Keith Trujillo  [id number]  [birth year]   Date of visit: 09/30/19  Diagnosis- stage IIIb colon adenocarcinoma  Chief complaint/ Reason for visit-discuss CT scan results and further management  Heme/Onc history: patient is a 59 year old male who was seen by Dr. Remi Haggard GI for symptoms of diarrhea as well as abdominal pain.He underwent a colonoscopy on 01/02/2019 which showed an ulcerated partially obstructing large mass in the sigmoid colon which measured 10 cm in length biopsy showed adenocarcinoma moderately differentiated. Baseline CEA elevated at 71.7. He was seen by Dr. Dahlia Byes and will be undergoing laparoscopic hemicolectomy and lymph node sampling next week.  CT abdomen pelvis with contrast showed irregular marked circumferential wall thickening in the sigmoid colon near the rectosigmoid junction. The lesion longitudinally extends for approximately 5 to 6 cm. 4.1 x 2.8 cm ill-defined irregular paracolonic soft tissue mass may represent direct tumor extension or contiguous markedly enlarged lymph node. Abnormal lymph nodes also seen in the perirectal and presacral space concerning for metastatic disease. 1.5 cm ill-defined hypoattenuating lesion in the medial segment of the left liver which may be focal fatty deposition but cannot be definitively characterized.MRI did not show any evidence of liver metastases. CT chest was negative for metastatic disease.  Patient underwent a low anterior resection of the sigmoid mass. Final pathology showed 8 cm invasive colorectal adenocarcinoma grade 2. Tumor invades through the muscularis propria into  pericolorectal tissue. All margins are uninvolvedLymphovascular invasion and perineural invasion present. Lymph nodes 1+ out of 20 PT3PN1A. MSI stable  Patient completed 12 cycles of adjuvant FOLFOX chemotherapy on 09/16/2019  Interval history-patient reports some redness and pain in his right lower extremity which she has noticed over the last few days.  Denies any fever.  Neuropathy in his hands and feet is almost resolved  ECOG PS- 1 Pain scale- 0   Review of systems- Review of Systems  Constitutional: Negative for chills, fever, malaise/fatigue and weight loss.  HENT: Negative for congestion, ear discharge and nosebleeds.   Eyes: Negative for blurred vision.  Respiratory: Negative for cough, hemoptysis, sputum production, shortness of breath and wheezing.   Cardiovascular: Negative for chest pain, palpitations, orthopnea and claudication.  Gastrointestinal: Negative for abdominal pain, blood in stool, constipation, diarrhea, heartburn, melena, nausea and vomiting.  Genitourinary: Negative for dysuria, flank pain, frequency, hematuria and urgency.  Musculoskeletal: Negative for back pain, joint pain and myalgias.       Right lower extremity redness and swelling  Skin: Negative for rash.  Neurological: Negative for dizziness, tingling, focal weakness, seizures, weakness and headaches.  Endo/Heme/Allergies: Does not bruise/bleed easily.  Psychiatric/Behavioral: Negative for depression and suicidal ideas. The patient does not have insomnia.      Allergies  Allergen Reactions   Sulfa Antibiotics Rash     Past Medical History:  Diagnosis Date   Abdominal pain    Allergy    Cancer ([place])    Cellulitis    Colon cancer ([place])    Dyspnea    Liver spot    Loose stools    Weight loss      Past Surgical History:  Procedure Laterality Date   COLONOSCOPY WITH PROPOFOL N/A 01/02/2019   Procedure: COLONOSCOPY WITH  BIOPSY;  Surgeon: [name], MD;  Location: [place];  Service: Endoscopy;  Laterality: N/A;  Clip x2 at Biopsy Site   COLOSTOMY Right 02/06/2019   Procedure: COLOSTOMY;  Surgeon: [name], MD;  Location: [facility];  Service: General;  Laterality: Right;   excision of skin lesion     LAPAROSCOPIC SIGMOID COLECTOMY N/A 02/06/2019   Procedure: LAPAROSCOPIC SIGMOID COLECTOMY converted to open procedure;  Surgeon: [name], MD;  Location: [facility];  Service: General;  Laterality: N/A;   PORTACATH PLACEMENT Right 03/06/2019   Procedure: INSERTION PORT-A-CATH;  Surgeon: [name], MD;  Location: [facility];  Service: General;  Laterality: Right;   TONSILLECTOMY AND ADENOIDECTOMY  1969    Social History   Socioeconomic History   Marital status: Married    Spouse name: Not on file   Number of children: Not on file   Years of education: Not on file   Highest education level: Not on file  Occupational History   Not on file  Tobacco Use   Smoking status: Light Tobacco Smoker    Types: Cigars   Smokeless tobacco: Former Systems developer    Quit date: 1980  Scientific laboratory technician Use: Never used  Substance and Sexual Activity   Alcohol use: Not Currently    Comment: rare   Drug use: Never   Sexual activity: Yes  Other Topics Concern   Not on file  Social History Narrative   Not on file   Social Determinants of Health   Financial Resource Strain:    Difficulty of Paying Living Expenses:   Food Insecurity:    Worried About Charity fundraiser in the Last Year:    Arboriculturist in the Last Year:   Transportation Needs:    Film/video editor (Medical):    Lack of Transportation (Non-Medical):   Physical Activity:    Days of Exercise per Week:    Minutes of Exercise per Session:   Stress:    Feeling of Stress :   Social Connections:    Frequency of Communication with Friends and Family:    Frequency of Social Gatherings with Friends and Family:    Attends Religious Services:     Active Member of Clubs or Organizations:    Attends Music therapist:    Marital Status:   Intimate Partner Violence:    Fear of Current or Ex-Partner:    Emotionally Abused:    Physically Abused:    Sexually Abused:     Family History  Problem Relation Age of Onset   Lung cancer Father      Current Outpatient Medications:    Loperamide HCl (IMODIUM PO), Take 2 mg by mouth 2 (two) times daily as needed (diarrhea or loose stools). , Disp: , Rfl:    loratadine (CLARITIN) 10 MG tablet, Take 10 mg by mouth daily. Just around chemo for 4 days, Disp: , Rfl:    Multiple Vitamin (MULTIVITAMIN) tablet, Take 1 tablet by mouth daily. Centrum, Disp: , Rfl:    prochlorperazine (COMPAZINE) 10 MG tablet, Take 1 tablet (10 mg total) by mouth every 6 (six) hours as needed (Nausea or vomiting)., Disp: 30 tablet, Rfl: 1   dexamethasone (DECADRON) 4 MG tablet, Take 2 tablets (8 mg total) by mouth daily. Start the day after chemotherapy for 2 days. Take with food. (Patient not taking: Reported on 09/30/2019), Disp: 30 tablet, Rfl: 1   lidocaine-prilocaine (EMLA) cream, Apply  to affected area once (Patient not taking: Reported on 09/02/2019), Disp: 30 g, Rfl: 3   ondansetron (ZOFRAN) 8 MG tablet, Take 1 tablet (8 mg total) by mouth 2 (two) times daily as needed for refractory nausea / vomiting. Start on day 3 after chemotherapy. (Patient not taking: Reported on 09/02/2019), Disp: 30 tablet, Rfl: 1 No current facility-administered medications for this visit.  Facility-Administered Medications Ordered in Other Visits:    sodium chloride flush (NS) 0.9 % injection 10 mL, 10 mL, Intravenous, PRN, Sindy Guadeloupe, MD, 10 mL at 09/02/19 0832  Physical exam:  Vitals:   09/30/19 1349  BP: 117/64  Pulse: 73  Resp: 18  Temp: 97.8 F (36.6 C)  TempSrc: Tympanic  SpO2: 100%  Weight: 202 lb 9.6 oz (91.9 kg)   Physical Exam Pulmonary:     Effort: Pulmonary effort is normal.    Abdominal:     General: Bowel sounds are normal.     Palpations: Abdomen is soft.     Comments: Colostomy in place  Musculoskeletal:     Comments: There is local warmth as well as redness noted over the span of 8 cm about the right medial malleolus consistent with cellulitis  Skin:    General: Skin is warm and dry.  Neurological:     Mental Status: He is alert and oriented to person, place, and time.      CMP Latest Ref Rng & Units 09/30/2019  Glucose 70 - 99 mg/dL 109(H)  BUN 6 - 20 mg/dL 16  Creatinine 0.61 - 1.24 mg/dL 0.77  Sodium 135 - 145 mmol/L 137  Potassium 3.5 - 5.1 mmol/L 4.1  Chloride 98 - 111 mmol/L 104  CO2 22 - 32 mmol/L 26  Calcium 8.9 - 10.3 mg/dL 8.8(L)  Total Protein 6.5 - 8.1 g/dL 6.6  Total Bilirubin 0.3 - 1.2 mg/dL 0.6  Alkaline Phos 38 - 126 U/L 125  AST 15 - 41 U/L 24  ALT 0 - 44 U/L 29   CBC Latest Ref Rng & Units 09/30/2019  WBC 4.0 - 10.5 K/uL 15.4(H)  Hemoglobin 13.0 - 17.0 g/dL 11.5(L)  Hematocrit 39 - 52 % 33.7(L)  Platelets 150 - 400 K/uL 108(L)    No images are attached to the encounter.  CT Chest W Contrast  Result Date: 09/25/2019 CLINICAL DATA:  Chemotherapy, history of colon cancer. EXAM: CT CHEST, ABDOMEN, AND PELVIS WITH CONTRAST TECHNIQUE: Multidetector CT imaging of the chest, abdomen and pelvis was performed following the standard protocol during bolus administration of intravenous contrast. CONTRAST:  111m OMNIPAQUE IOHEXOL 300 MG/ML  SOLN COMPARISON:  CT chest from December of 2020, MRI abdomen from December of 2020 and CT abdomen and pelvis from November of 2020 FINDINGS: CT CHEST FINDINGS Cardiovascular: Heart size is normal. Unchanged. No pericardial effusion. Aortic caliber and contour is normal. Central pulmonary arteries on venous phase assessment are normal. RIGHT IJ Port-A-Cath terminates at the caval to atrial junction. Mediastinum/Nodes: Thoracic inlet structures are normal. No axillary lymphadenopathy. No mediastinal  lymphadenopathy. No hilar adenopathy. Lungs/Pleura: No consolidation.  No sign of pleural effusion. Basilar atelectasis. Airways are patent. Tiny LEFT lower lobe nodule approximately 3 mm is unchanged. (Image 116,) Musculoskeletal: No chest wall lesion. No acute bone finding. No destructive bone process involving the bony thorax. CT ABDOMEN PELVIS FINDINGS Hepatobiliary: Mildly lobular hepatic contours. No suspicious focal hepatic lesion. No pericholecystic stranding. Mild distension of the common bile duct intrahepatic biliary tree with a similar appearance to previous imaging,  common exceeding 6 mm in the absence of cholecystectomy. Pancreas: Pancreas without ductal dilation, inflammation or focal lesion. Spleen: Spleen mildly enlarged 14 cm greatest craniocaudal extent as compared to approximately 11 cm on the prior study. Adrenals/Urinary Tract: Adrenal glands are normal. No sign of hydronephrosis. No suspicious renal lesion. Stomach/Bowel: Marked artifact from retained barium within the fecal stream from prior barium enema likely from March of 2021. This limits assessment of bowel loops particularly in the lower abdomen and of adjacent structures. RIGHT lower quadrant diverting ostomy. Mildly nodular changes along pelvic fascial planes (image 118, series 2) to the LEFT of the rectal anastomosis measuring 7 mm greatest thickness. Small amount of fluid density also seen in the pelvis measuring approximately 2 x 1.8 cm again difficult to assess due to marked increased density in the setting of retained barium. Stomach filled with ingested contrast material. Stomach and duodenum are distended. Filled with ingested enteric contrast material. Distal small bowel loops are normal though bowel loops in the RIGHT lower quadrant show limited assessment due to the barium described above. Vascular/Lymphatic: No adenopathy in the retroperitoneum. No adenopathy in the pelvis. Aortic caliber is normal. Vascular structures in  the abdomen are patent. Reproductive: Prostate grossly normal though with very limited assessment due to barium and on CT. Other: Diverting ostomy in the RIGHT lower quadrant. Musculoskeletal: Presacral density measuring approximately 13 by 20 mm on image 118 of series 2 adjacent to fluid and fascial nodularity discussed above. No destructive bone lesion or acute bone finding. IMPRESSION: 1. Marked artifact from retained barium within the fecal stream from prior barium enema likely from March of 2021. This limits assessment of bowel loops particularly in the lower abdomen and of adjacent structures. 2. Nodular changes and areas of fluid and or soft tissue density in the pelvis adjacent to the anastomosis may reflect post surgical/post treatment changes. This will serve as a baseline for further follow-up. The lack of stranding argues against acute process but would correlate with any clinical symptoms in the pelvis. No gas is present within these areas. 3. Mild distension of the common bile duct intrahepatic biliary tree with a similar appearance to previous imaging, common bile duct exceeding 6 mm in the absence of cholecystectomy. Clinical and laboratory correlation may be helpful to determine whether further biliary evaluation may be warranted. 4. Slightly increased size of the spleen compared to previous imaging, of uncertain significance but without focal abnormality. 5. Tiny 3 mm LEFT lower lobe nodule is unchanged. Attention on follow-up. 6. No evidence of metastatic disease to the chest. 7. Aortic atherosclerosis. Aortic Atherosclerosis (ICD10-I70.0). Electronically Signed   By: Zetta Bills M.D.   On: 09/25/2019 14:54   CT Abdomen Pelvis W Contrast  Result Date: 09/25/2019 CLINICAL DATA:  Chemotherapy, history of colon cancer. EXAM: CT CHEST, ABDOMEN, AND PELVIS WITH CONTRAST TECHNIQUE: Multidetector CT imaging of the chest, abdomen and pelvis was performed following the standard protocol during bolus  administration of intravenous contrast. CONTRAST:  161m OMNIPAQUE IOHEXOL 300 MG/ML  SOLN COMPARISON:  CT chest from December of 2020, MRI abdomen from December of 2020 and CT abdomen and pelvis from November of 2020 FINDINGS: CT CHEST FINDINGS Cardiovascular: Heart size is normal. Unchanged. No pericardial effusion. Aortic caliber and contour is normal. Central pulmonary arteries on venous phase assessment are normal. RIGHT IJ Port-A-Cath terminates at the caval to atrial junction. Mediastinum/Nodes: Thoracic inlet structures are normal. No axillary lymphadenopathy. No mediastinal lymphadenopathy. No hilar adenopathy. Lungs/Pleura: No consolidation.  No  sign of pleural effusion. Basilar atelectasis. Airways are patent. Tiny LEFT lower lobe nodule approximately 3 mm is unchanged. (Image 116,) Musculoskeletal: No chest wall lesion. No acute bone finding. No destructive bone process involving the bony thorax. CT ABDOMEN PELVIS FINDINGS Hepatobiliary: Mildly lobular hepatic contours. No suspicious focal hepatic lesion. No pericholecystic stranding. Mild distension of the common bile duct intrahepatic biliary tree with a similar appearance to previous imaging, common exceeding 6 mm in the absence of cholecystectomy. Pancreas: Pancreas without ductal dilation, inflammation or focal lesion. Spleen: Spleen mildly enlarged 14 cm greatest craniocaudal extent as compared to approximately 11 cm on the prior study. Adrenals/Urinary Tract: Adrenal glands are normal. No sign of hydronephrosis. No suspicious renal lesion. Stomach/Bowel: Marked artifact from retained barium within the fecal stream from prior barium enema likely from March of 2021. This limits assessment of bowel loops particularly in the lower abdomen and of adjacent structures. RIGHT lower quadrant diverting ostomy. Mildly nodular changes along pelvic fascial planes (image 118, series 2) to the LEFT of the rectal anastomosis measuring 7 mm greatest thickness.  Small amount of fluid density also seen in the pelvis measuring approximately 2 x 1.8 cm again difficult to assess due to marked increased density in the setting of retained barium. Stomach filled with ingested contrast material. Stomach and duodenum are distended. Filled with ingested enteric contrast material. Distal small bowel loops are normal though bowel loops in the RIGHT lower quadrant show limited assessment due to the barium described above. Vascular/Lymphatic: No adenopathy in the retroperitoneum. No adenopathy in the pelvis. Aortic caliber is normal. Vascular structures in the abdomen are patent. Reproductive: Prostate grossly normal though with very limited assessment due to barium and on CT. Other: Diverting ostomy in the RIGHT lower quadrant. Musculoskeletal: Presacral density measuring approximately 13 by 20 mm on image 118 of series 2 adjacent to fluid and fascial nodularity discussed above. No destructive bone lesion or acute bone finding. IMPRESSION: 1. Marked artifact from retained barium within the fecal stream from prior barium enema likely from March of 2021. This limits assessment of bowel loops particularly in the lower abdomen and of adjacent structures. 2. Nodular changes and areas of fluid and or soft tissue density in the pelvis adjacent to the anastomosis may reflect post surgical/post treatment changes. This will serve as a baseline for further follow-up. The lack of stranding argues against acute process but would correlate with any clinical symptoms in the pelvis. No gas is present within these areas. 3. Mild distension of the common bile duct intrahepatic biliary tree with a similar appearance to previous imaging, common bile duct exceeding 6 mm in the absence of cholecystectomy. Clinical and laboratory correlation may be helpful to determine whether further biliary evaluation may be warranted. 4. Slightly increased size of the spleen compared to previous imaging, of uncertain  significance but without focal abnormality. 5. Tiny 3 mm LEFT lower lobe nodule is unchanged. Attention on follow-up. 6. No evidence of metastatic disease to the chest. 7. Aortic atherosclerosis. Aortic Atherosclerosis (ICD10-I70.0). Electronically Signed   By: Zetta Bills M.D.   On: 09/25/2019 14:54     Assessment and plan- Patient is a 59 y.o. 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   Patient has completed 12 cycles of adjuvant FOLFOX chemotherapy and is this is a routine follow-up visit  Colon cancer: I have reviewed CT chest abdomen pelvis images independently and discussed findings with the patient.  Evaluation of the bowel and adjacent  structures was limited due to retention from prior barium enema.  Overtly there was no progressive or recurrent disease seen on his present CT scan.  Patient will be undergoing a repeat sigmoidoscopy in early August by Dr. Allen Norris.  After that he will be seen by Dr. Dahlia Byes for colostomy takedown.  At the same time he can also get his port taken out.  His insurance will only allow yearly surveillance scan.  I will see him back in 3 months with CBC with differential CMP and CEA  Right lower extremity cellulitis: I have given him prescription for doxycycline 100 mg twice daily for 7 days.  If symptoms do not improve he will call us and let us know.  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  Continue to monitor.  Hopefully it should resolve over the next 3 months   Visit Diagnosis 1. Encounter for follow-up surveillance of colon cancer   2. Cancer of sigmoid [facility])      Dr. Randa Evens, MD, MPH [facility] [phone] 09/30/2019 2:26 PM

## 2019-10-01 LAB — CEA: CEA: 1.7 ng/mL (ref 0.0–4.7)

## 2019-10-16 ENCOUNTER — Encounter: Payer: Self-pay | Admitting: Gastroenterology

## 2019-10-16 ENCOUNTER — Other Ambulatory Visit: Payer: Self-pay

## 2019-10-21 ENCOUNTER — Other Ambulatory Visit: Payer: Self-pay

## 2019-10-21 ENCOUNTER — Other Ambulatory Visit
Admission: RE | Admit: 2019-10-21 | Discharge: 2019-10-21 | Disposition: A | Discharge status: Home / Self Care | Payer: No Typology Code available for payment source | Source: Ambulatory Visit | Attending: Gastroenterology | Admitting: Gastroenterology

## 2019-10-21 DIAGNOSIS — Z01812 Encounter for preprocedural laboratory examination: Secondary | ICD-10-CM | POA: Insufficient documentation

## 2019-10-21 DIAGNOSIS — Z20822 Contact with and (suspected) exposure to covid-19: Secondary | ICD-10-CM | POA: Diagnosis not present

## 2019-10-21 LAB — SARS CORONAVIRUS 2 (TAT 6-24 HRS): SARS Coronavirus 2: NEGATIVE

## 2019-10-22 NOTE — Discharge Instructions (Signed)
General Anesthesia, Adult, Care After This sheet gives you information about how to care for yourself after your procedure. Your health care provider may also give you more specific instructions. If you have problems or questions, contact your health care provider. What can I expect after the procedure? After the procedure, the following side effects are common:  Pain or discomfort at the IV site.  Nausea.  Vomiting.  Sore throat.  Trouble concentrating.  Feeling cold or chills.  Weak or tired.  Sleepiness and fatigue.  Soreness and body aches. These side effects can affect parts of the body that were not involved in surgery. Follow these instructions at home:  For at least 24 hours after the procedure:  Have a responsible adult stay with you. It is important to have someone help care for you until you are awake and alert.  Rest as needed.  Do not: ? Participate in activities in which you could fall or become injured. ? Drive. ? Use heavy machinery. ? Drink alcohol. ? Take sleeping pills or medicines that cause drowsiness. ? Make important decisions or sign legal documents. ? Take care of children on your own. Eating and drinking  Follow any instructions from your health care provider about eating or drinking restrictions.  When you feel hungry, start by eating small amounts of foods that are soft and easy to digest (bland), such as toast. Gradually return to your regular diet.  Drink enough fluid to keep your urine pale yellow.  If you vomit, rehydrate by drinking water, juice, or clear broth. General instructions  If you have sleep apnea, surgery and certain medicines can increase your risk for breathing problems. Follow instructions from your health care provider about wearing your sleep device: ? Anytime you are sleeping, including during daytime naps. ? While taking prescription pain medicines, sleeping medicines, or medicines that make you drowsy.  Return to  your normal activities as told by your health care provider. Ask your health care provider what activities are safe for you.  Take over-the-counter and prescription medicines only as told by your health care provider.  If you smoke, do not smoke without supervision.  Keep all follow-up visits as told by your health care provider. This is important. Contact a health care provider if:  You have nausea or vomiting that does not get better with medicine.  You cannot eat or drink without vomiting.  You have pain that does not get better with medicine.  You are unable to pass urine.  You develop a skin rash.  You have a fever.  You have redness around your IV site that gets worse. Get help right away if:  You have difficulty breathing.  You have chest pain.  You have blood in your urine or stool, or you vomit blood. Summary  After the procedure, it is common to have a sore throat or nausea. It is also common to feel tired.  Have a responsible adult stay with you for the first 24 hours after general anesthesia. It is important to have someone help care for you until you are awake and alert.  When you feel hungry, start by eating small amounts of foods that are soft and easy to digest (bland), such as toast. Gradually return to your regular diet.  Drink enough fluid to keep your urine pale yellow.  Return to your normal activities as told by your health care provider. Ask your health care provider what activities are safe for you. This information is not   intended to replace advice given to you by your health care provider. Make sure you discuss any questions you have with your health care provider. Document Revised: 03/09/2017 Document Reviewed: 10/20/2016 Elsevier Patient Education  2020 [employer] Inc.  

## 2019-10-23 ENCOUNTER — Ambulatory Visit: Payer: No Typology Code available for payment source | Admitting: Anesthesiology

## 2019-10-23 ENCOUNTER — Other Ambulatory Visit: Payer: Self-pay | Admitting: Gastroenterology

## 2019-10-23 ENCOUNTER — Encounter
Admission: RE | Disposition: A | Discharge status: Home / Self Care | Payer: Self-pay | Source: Home / Self Care | Attending: Gastroenterology

## 2019-10-23 ENCOUNTER — Encounter: Payer: Self-pay | Admitting: Gastroenterology

## 2019-10-23 ENCOUNTER — Ambulatory Visit
Admission: RE | Admit: 2019-10-23 | Discharge: 2019-10-23 | Disposition: A | Discharge status: Home / Self Care | Payer: No Typology Code available for payment source | Attending: Gastroenterology | Admitting: Gastroenterology

## 2019-10-23 ENCOUNTER — Other Ambulatory Visit: Payer: Self-pay

## 2019-10-23 DIAGNOSIS — Z9049 Acquired absence of other specified parts of digestive tract: Secondary | ICD-10-CM | POA: Insufficient documentation

## 2019-10-23 DIAGNOSIS — K633 Ulcer of intestine: Secondary | ICD-10-CM | POA: Diagnosis not present

## 2019-10-23 DIAGNOSIS — F17290 Nicotine dependence, other tobacco product, uncomplicated: Secondary | ICD-10-CM | POA: Insufficient documentation

## 2019-10-23 DIAGNOSIS — K9189 Other postprocedural complications and disorders of digestive system: Secondary | ICD-10-CM | POA: Diagnosis not present

## 2019-10-23 DIAGNOSIS — K635 Polyp of colon: Secondary | ICD-10-CM | POA: Diagnosis not present

## 2019-10-23 DIAGNOSIS — Y832 Surgical operation with anastomosis, bypass or graft as the cause of abnormal reaction of the patient, or of later complication, without mention of misadventure at the time of the procedure: Secondary | ICD-10-CM | POA: Diagnosis not present

## 2019-10-23 DIAGNOSIS — Z933 Colostomy status: Secondary | ICD-10-CM | POA: Insufficient documentation

## 2019-10-23 DIAGNOSIS — C189 Malignant neoplasm of colon, unspecified: Secondary | ICD-10-CM

## 2019-10-23 DIAGNOSIS — K56609 Unspecified intestinal obstruction, unspecified as to partial versus complete obstruction: Secondary | ICD-10-CM | POA: Insufficient documentation

## 2019-10-23 DIAGNOSIS — Z85038 Personal history of other malignant neoplasm of large intestine: Secondary | ICD-10-CM

## 2019-10-23 DIAGNOSIS — Z79899 Other long term (current) drug therapy: Secondary | ICD-10-CM | POA: Insufficient documentation

## 2019-10-23 DIAGNOSIS — K56699 Other intestinal obstruction unspecified as to partial versus complete obstruction: Secondary | ICD-10-CM

## 2019-10-23 HISTORY — PX: POLYPECTOMY: SHX5525

## 2019-10-23 HISTORY — PX: FLEXIBLE SIGMOIDOSCOPY: SHX5431

## 2019-10-23 SURGERY — SIGMOIDOSCOPY, FLEXIBLE
Anesthesia: General | Site: Rectum

## 2019-10-23 MED ORDER — LACTATED RINGERS IV SOLN: INTRAVENOUS | Status: DC

## 2019-10-23 MED ORDER — STERILE WATER FOR IRRIGATION IR SOLN: Status: DC | PRN

## 2019-10-23 MED ORDER — PROPOFOL 10 MG/ML IV BOLUS
INTRAVENOUS | Status: DC | PRN
  Administered 2019-10-23 (×4): 30 mg via INTRAVENOUS
  Administered 2019-10-23: 150 mg via INTRAVENOUS
  Administered 2019-10-23: 20 mg via INTRAVENOUS
  Administered 2019-10-23: 50 mg via INTRAVENOUS

## 2019-10-23 MED ORDER — PHENYLEPHRINE HCL (PRESSORS) 10 MG/ML IV SOLN
INTRAVENOUS | Status: DC | PRN
Start: 2019-10-23 — End: 2019-10-23
  Administered 2019-10-23: 100 ug via INTRAVENOUS
  Administered 2019-10-23: 50 ug via INTRAVENOUS

## 2019-10-23 MED ORDER — SODIUM CHLORIDE 0.9 % IV SOLN: INTRAVENOUS | Status: DC

## 2019-10-23 MED ORDER — LIDOCAINE HCL (CARDIAC) PF 100 MG/5ML IV SOSY
PREFILLED_SYRINGE | INTRAVENOUS | Status: DC | PRN
  Administered 2019-10-23: 30 mg via INTRAVENOUS

## 2019-10-23 SURGICAL SUPPLY — 12 items
BALLN DILATOR 12-15 8 (BALLOONS) ×3
BALLOON DILATOR 12-15 8 (BALLOONS) ×1 IMPLANT
BLOCK BITE 60FR ADLT L/F GRN (MISCELLANEOUS) ×3 IMPLANT
FORCEPS BIOP RAD 4 LRG CAP 4 (CUTTING FORCEPS) IMPLANT
GOWN CVR UNV OPN BCK APRN NK (MISCELLANEOUS) ×2 IMPLANT
GOWN ISOL THUMB LOOP REG UNIV (MISCELLANEOUS) ×6
KIT ENDO PROCEDURE OLY (KITS) ×3 IMPLANT
MANIFOLD NEPTUNE II (INSTRUMENTS) ×3 IMPLANT
SNARE SHORT THROW 13M SML OVAL (MISCELLANEOUS) ×3 IMPLANT
SYR INFLATION 60ML (SYRINGE) ×3 IMPLANT
TRAP ETRAP POLY (MISCELLANEOUS) ×3 IMPLANT
WATER STERILE IRR 250ML POUR (IV SOLUTION) ×3 IMPLANT

## 2019-10-23 NOTE — Transfer of Care (Signed)
Immediate Anesthesia Transfer of Care Note  Patient: Keith Trujillo  Procedure(s) Performed: FLEXIBLE SIGMOIDOSCOPY WITH BIOPSY and DILATION (N/A Rectum) POLYPECTOMY (N/A Rectum)  Patient Location: PACU  Anesthesia Type: General  Level of Consciousness: awake, alert  and patient cooperative  Airway and Oxygen Therapy: Patient Spontanous Breathing and Patient connected to supplemental oxygen  Post-op Assessment: Post-op Vital signs reviewed, Patient's Cardiovascular Status Stable, Respiratory Function Stable, Patent Airway and No signs of Nausea or vomiting  Post-op Vital Signs: Reviewed and stable  Complications: No complications documented.

## 2019-10-23 NOTE — Anesthesia Procedure Notes (Signed)
Date/Time: 10/23/2019 9:02 AM Performed by: Cameron Ali, CRNA Pre-anesthesia Checklist: Patient identified, Emergency Drugs available, Suction available, Timeout performed and Patient being monitored Patient Re-evaluated:Patient Re-evaluated prior to induction Oxygen Delivery Method: Nasal cannula Placement Confirmation: positive ETCO2

## 2019-10-23 NOTE — Op Note (Signed)
[facility] Gastroenterology Patient Name: Keith Trujillo Procedure Date: 10/23/2019 8:54 AM MRN: [id number] Account #: [id number] Date of Birth: 1961/01/23 Admit Type: Outpatient Age: 59 Room: [facility] OR ROOM 01 Gender: Male Note Status: Finalized Procedure:             Flexible Sigmoidoscopy Indications:           High risk colon cancer surveillance: Personal history                         of colon cancer Providers:             [name] MD, MD Referring MD:          Lavera Guise, MD (Referring MD) Medicines:             Propofol per Anesthesia Complications:         No immediate complications. Procedure:             Pre-Anesthesia Assessment:                        - Prior to the procedure, a History and Physical was                         performed, and patient medications and allergies were                         reviewed. The patient's tolerance of previous                         anesthesia was also reviewed. The risks and benefits                         of the procedure and the sedation options and risks                         were discussed with the patient. All questions were                         answered, and informed consent was obtained. Prior                         Anticoagulants: The patient has taken no previous                         anticoagulant or antiplatelet agents. ASA Grade                         Assessment: II - A patient with mild systemic disease.                         After reviewing the risks and benefits, the patient                         was deemed in satisfactory condition to undergo the                         procedure.  After obtaining informed consent, the scope was passed                         under direct vision. The was introduced through the                         anus and advanced to the the sigmoid colon. The                         flexible sigmoidoscopy was accomplished without                          difficulty. The patient tolerated the procedure well.                         The quality of the bowel preparation was excellent. Findings:      The perianal and digital rectal examinations were normal.      A benign-appearing, intrinsic severe stenosis measuring 9 mm (inner       diameter) was found at the anastomosis and was traversed after dilation.       A TTS dilator was passed through the scope. Dilation with a 12-13.5-15       mm balloon dilator was performed to 15 mm. The dilation site was       examined following endoscope reinsertion and showed moderate improvement       in luminal narrowing.      A 12 mm polyp was found in the anastomosis. The polyp was sessile. The       polyp was removed with a cold snare. Resection and retrieval were       complete. 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 Dilated.                        - One 12 mm polyp at the anastomosis, removed with a                         cold snare. Resected and retrieved. Recommendation:        - Discharge patient to home.                        - Resume previous diet.                        - Await pathology results.                        - If adenomatous then should consider revision. Procedure Code(s):     --- Professional ---                        [phone], Sigmoidoscopy, flexible; with removal of                         tumor(s), polyp(s), or other lesion(s) by snare                         technique  [id number], Sigmoidoscopy, flexible; with transendoscopic                         balloon dilation Diagnosis Code(s):     --- Professional ---                        Z85.038, Personal history of other malignant neoplasm                         of large intestine                        K56.699, Other intestinal obstruction unspecified as                         to partial versus complete obstruction                        K63.5, Polyp of colon CPT  copyright 2019 American Medical Association. All rights reserved. The codes documented in this report are preliminary and upon coder review may  be revised to meet current compliance requirements. [name] MD, MD 10/23/2019 9:28:50 AM This report has been signed electronically. Number of Addenda: 0 Note Initiated On: 10/23/2019 8:54 AM Total Procedure Duration: 0 hours 15 minutes 4 seconds  Estimated Blood Loss:  Estimated blood loss: none.      [facility]

## 2019-10-23 NOTE — Anesthesia Preprocedure Evaluation (Signed)
Anesthesia Evaluation  Patient identified by MRN, date of birth, ID band Patient awake    Reviewed: Allergy & Precautions, NPO status   Airway Mallampati: II  TM Distance: >3 FB     Dental   Pulmonary Current Smoker and Patient abstained from smoking.,    breath sounds clear to auscultation       Cardiovascular negative cardio ROS   Rhythm:Regular Rate:Normal     Neuro/Psych    GI/Hepatic Colon cancer   Endo/Other  negative endocrine ROS  Renal/GU negative Renal ROS     Musculoskeletal   Abdominal   Peds  Hematology negative hematology ROS (+)   Anesthesia Other Findings   Reproductive/Obstetrics                             Anesthesia Physical Anesthesia Plan  ASA: III  Anesthesia Plan: General   Post-op Pain Management:    Induction: Intravenous  PONV Risk Score and Plan: Propofol infusion, TIVA and Treatment may vary due to age or medical condition  Airway Management Planned: Natural Airway and Nasal Cannula  Additional Equipment:   Intra-op Plan:   Post-operative Plan:   Informed Consent: I have reviewed the patients History and Physical, chart, labs and discussed the procedure including the risks, benefits and alternatives for the proposed anesthesia with the patient or authorized representative who has indicated his/her understanding and acceptance.       Plan Discussed with: CRNA  Anesthesia Plan Comments:         Anesthesia Quick Evaluation

## 2019-10-23 NOTE — Anesthesia Postprocedure Evaluation (Signed)
Anesthesia Post Note  Patient: Keith Trujillo  Procedure(s) Performed: FLEXIBLE SIGMOIDOSCOPY WITH BIOPSY and DILATION (N/A Rectum) POLYPECTOMY (N/A Rectum)     Patient location during evaluation: PACU Anesthesia Type: General Level of consciousness: awake Pain management: pain level controlled Vital Signs Assessment: post-procedure vital signs reviewed and stable Respiratory status: respiratory function stable Cardiovascular status: stable Postop Assessment: no signs of nausea or vomiting Anesthetic complications: no   No complications documented.  Veda Canning

## 2019-10-23 NOTE — H&P (Signed)
[name], MD [address] [place], [place] [address] [phone] Fax : [phone]  Primary Care Physician:  Kendell Bane, NP Primary Gastroenterologist:  Dr. Allen Norris  Pre-Procedure History & Physical: HPI:  Keith Trujillo is a 58 y.o. male is here for an flexible sigmoidoscopy.   Past Medical History:  Diagnosis Date  . Abdominal pain   . Allergy   . Cancer ([place])   . Cellulitis   . Colon cancer ([place])   . Dyspnea   . Liver spot   . Loose stools   . Weight loss     Past Surgical History:  Procedure Laterality Date  . COLONOSCOPY WITH PROPOFOL N/A 01/02/2019   Procedure: COLONOSCOPY WITH BIOPSY;  Surgeon: [name], MD;  Location: [place];  Service: Endoscopy;  Laterality: N/A;  Clip x2 at Biopsy Site  . COLOSTOMY Right 02/06/2019   Procedure: COLOSTOMY;  Surgeon: [name], MD;  Location: [facility];  Service: General;  Laterality: Right;  . excision of skin lesion    . LAPAROSCOPIC SIGMOID COLECTOMY N/A 02/06/2019   Procedure: LAPAROSCOPIC SIGMOID COLECTOMY converted to open procedure;  Surgeon: [name], MD;  Location: [facility];  Service: General;  Laterality: N/A;  . PORTACATH PLACEMENT Right 03/06/2019   Procedure: INSERTION PORT-A-CATH;  Surgeon: [name], MD;  Location: [facility];  Service: General;  Laterality: Right;  . TONSILLECTOMY AND ADENOIDECTOMY  1969    Prior to Admission medications   Medication Sig Start Date End Date Taking? Authorizing Provider  lidocaine-prilocaine (EMLA) cream Apply to affected area once 02/24/19  Yes Sindy Guadeloupe, MD  Loperamide HCl (IMODIUM PO) Take 2 mg by mouth 2 (two) times daily as needed (diarrhea or loose stools).    Yes [provider]  loratadine (CLARITIN) 10 MG tablet Take 10 mg by mouth daily. Just around chemo for 4 days   Yes [provider]  Multiple Vitamin (MULTIVITAMIN) tablet Take 1 tablet by mouth daily. Centrum   Yes [provider]  dexamethasone (DECADRON) 4 MG tablet Take 2 tablets (8 mg total) by mouth daily. Start the day after chemotherapy for 2 days. Take with food. Patient not taking: Reported on 09/30/2019 02/24/19   Sindy Guadeloupe, MD  doxycycline (VIBRA-TABS) 100 MG tablet Take 1 tablet (100 mg total) by mouth 2 (two) times daily. Patient not taking: Reported on 10/16/2019 09/30/19   Sindy Guadeloupe, MD  ondansetron (ZOFRAN) 8 MG tablet Take 1 tablet (8 mg total) by mouth 2 (two) times daily as needed for refractory nausea / vomiting. Start on day 3 after chemotherapy. Patient not taking: Reported on 09/02/2019 02/24/19   Sindy Guadeloupe, MD  prochlorperazine (COMPAZINE) 10 MG tablet Take 1 tablet (10 mg total) by mouth every 6 (six) hours as needed (Nausea or vomiting). Patient not taking: Reported on 10/16/2019 09/16/19   Sindy Guadeloupe, MD    Allergies as of 09/18/2019 - Review Complete 09/16/2019  Allergen Reaction Noted  . Sulfa antibiotics Rash 04/09/2018    Family History  Problem Relation Age of Onset  . Lung cancer Father     Social History   Socioeconomic History  . Marital status: Married    Spouse name: Not on file  . Number of children: Not on file  . Years of education: Not on file  . Highest education level: Not on file  Occupational History  . Not on file  Tobacco Use  . Smoking status:  Light Tobacco Smoker    Types: Cigars  . Smokeless tobacco: Former Systems developer    Quit date: Comptroller  . Vaping Use: Never used  Substance and Sexual Activity  . Alcohol use: Not Currently    Comment: rare  . Drug use: Never  . Sexual activity: Yes  Other Topics Concern  . Not on file  Social History Narrative  . Not on file   Social Determinants of Health   Financial Resource Strain:   . Difficulty of Paying Living Expenses:   Food Insecurity:   . Worried About Charity fundraiser in the Last Year:   . Arboriculturist in the Last Year:   Transportation Needs:   . Film/video editor  (Medical):   Marland Kitchen Lack of Transportation (Non-Medical):   Physical Activity:   . Days of Exercise per Week:   . Minutes of Exercise per Session:   Stress:   . Feeling of Stress :   Social Connections:   . Frequency of Communication with Friends and Family:   . Frequency of Social Gatherings with Friends and Family:   . Attends Religious Services:   . Active Member of Clubs or Organizations:   . Attends Archivist Meetings:   Marland Kitchen Marital Status:   Intimate Partner Violence:   . Fear of Current or Ex-Partner:   . Emotionally Abused:   Marland Kitchen Physically Abused:   . Sexually Abused:     Review of Systems: See HPI, otherwise negative ROS  Physical Exam: BP 128/67   Pulse 68   Temp 98.4 F (36.9 C) (Temporal)   Ht 6\' 4"  (1.93 m)   Wt 88.5 kg   SpO2 100%   BMI 23.74 kg/m  General:   Alert,  pleasant and cooperative in NAD Head:  Normocephalic and atraumatic. Neck:  Supple; no masses or thyromegaly. Lungs:  Clear throughout to auscultation.    Heart:  Regular rate and rhythm. Abdomen:  Soft, nontender and nondistended. Normal bowel sounds, without guarding, and without rebound.   Neurologic:  Alert and  oriented x4;  grossly normal neurologically.  Impression/Plan: Keith Trujillo is here for an flexible sigmoidoscopy to be performed for sigmoid srticure  Risks, benefits, limitations, and alternatives regarding  flexible sigmoidoscopy have been reviewed with the patient.  Questions have been answered.  All parties agreeable.   [name], MD  10/23/2019, 8:26 AM

## 2019-10-24 ENCOUNTER — Encounter: Payer: Self-pay | Admitting: Gastroenterology

## 2019-10-29 ENCOUNTER — Telehealth: Payer: Self-pay

## 2019-10-29 NOTE — Telephone Encounter (Signed)
Pt notified of procedure results via mychart.  °

## 2019-10-29 NOTE — Telephone Encounter (Signed)
-----   Message from [name], MD sent at 10/27/2019 12:13 PM EDT ----- Keith Trujillo please let the patient know that the polypoid lesion at the anastomosis was just inflammation without any signs of cancerous or precancerous tissue

## 2019-10-31 LAB — SURGICAL PATHOLOGY

## 2019-10-31 LAB — PATHOLOGY

## 2019-11-17 ENCOUNTER — Encounter: Payer: Self-pay | Admitting: Surgery

## 2019-11-17 ENCOUNTER — Other Ambulatory Visit: Payer: Self-pay

## 2019-11-17 ENCOUNTER — Ambulatory Visit (INDEPENDENT_AMBULATORY_CARE_PROVIDER_SITE_OTHER): Payer: No Typology Code available for payment source | Admitting: Surgery

## 2019-11-17 VITALS — BP 127/79 | HR 69 | Temp 98.3°F | Ht 76.0 in | Wt 201.0 lb

## 2019-11-17 DIAGNOSIS — C187 Malignant neoplasm of sigmoid colon: Secondary | ICD-10-CM

## 2019-11-17 NOTE — Patient Instructions (Addendum)
Prior to surgery we will have you get a Barium enema per rectum to check the surgery site.  You are scheduled for a Barium enema at [facility] on Wednesday September 8th  at 8:00 am. You will need to arrive at the [facility] at 7:30 am. Please have nothing to eat or drink after midnight.   Follow up here in 4 weeks, see your appointment below.    Go ahead and get you Covid vaccination series.

## 2019-11-19 ENCOUNTER — Encounter: Payer: Self-pay | Admitting: Surgery

## 2019-11-19 NOTE — Progress Notes (Signed)
Outpatient Surgical Follow Up  11/19/2019  Keith Trujillo is an 59 y.o. male.   Chief Complaint  Patient presents with  . 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  He comes in for further evaluation and possible ileostomy takedown.  He is doing well.  He recovered quite well from chemotherapy.  Has no complaints.  He did get Covid and treatment, did not require hospitalization and he did get abdominal antibodies. Really he denies any abdominal pain.  No fevers no chills.  He is ileostomy is working well.  He is walking and he is tolerating regular diet.  Recently had endoscopic evaluation showing evidence of a stricture at the anastomosis and was dilated up to 15 mm.  Please note that I have personally reviewed the images.  He did also have a barium enema that I have reviewed showing evidence of stricture at that time was not necessarily significant.  There was good opacification of the proximal colon.  There was no evidence of leak.  Past Medical History:  Diagnosis Date  . Abdominal pain   . Allergy   . Cancer ([place])   . Cellulitis   . Colon cancer ([place])   . Dyspnea   . Liver spot   . Loose stools   . Weight loss     Past Surgical History:  Procedure Laterality Date  . COLONOSCOPY WITH PROPOFOL N/A 01/02/2019   Procedure: COLONOSCOPY WITH BIOPSY;  Surgeon: [name], MD;  Location: [place];  Service: Endoscopy;  Laterality: N/A;  Clip x2 at Biopsy Site  . COLOSTOMY Right 02/06/2019   Procedure: COLOSTOMY;  Surgeon: [name], MD;  Location: [facility];  Service: General;  Laterality: Right;  . excision of skin lesion    . FLEXIBLE SIGMOIDOSCOPY N/A 10/23/2019   Procedure: FLEXIBLE SIGMOIDOSCOPY WITH BIOPSY and DILATION;  Surgeon: [name], MD;  Location: [place];  Service: Endoscopy;  Laterality: N/A;  needs port accessed  . LAPAROSCOPIC  SIGMOID COLECTOMY N/A 02/06/2019   Procedure: LAPAROSCOPIC SIGMOID COLECTOMY converted to open procedure;  Surgeon: [name], MD;  Location: [facility];  Service: General;  Laterality: N/A;  . POLYPECTOMY N/A 10/23/2019   Procedure: POLYPECTOMY;  Surgeon: [name], MD;  Location: [place];  Service: Endoscopy;  Laterality: N/A;  . PORTACATH PLACEMENT Right 03/06/2019   Procedure: INSERTION PORT-A-CATH;  Surgeon: [name], MD;  Location: [facility];  Service: General;  Laterality: Right;  . TONSILLECTOMY AND ADENOIDECTOMY  1969    Family History  Problem Relation Age of Onset  . Lung cancer Father     Social History:  reports that he has been smoking cigars. He quit smokeless tobacco use about 41 years ago. He reports previous alcohol use. He reports that he does not use drugs.  Allergies:  Allergies  Allergen Reactions  . Sulfa Antibiotics Rash    Medications reviewed.    ROS Full ROS performed and is otherwise negative other than what is stated in HPI   BP 127/79   Pulse 69   Temp 98.3 F (36.8 C)   Ht 6\' 4"  (1.93 m)   Wt 201 lb (91.2 kg)   SpO2 97%   BMI 24.47 kg/m   Physical Exam Vitals and nursing note reviewed. Exam conducted with a chaperone present.  Constitutional:      General: He is not in acute distress.  Appearance: Normal appearance. He is normal weight.  Eyes:     General: No scleral icterus.       Right eye: No discharge.        Left eye: No discharge.  Cardiovascular:     Rate and Rhythm: Normal rate and regular rhythm.     Heart sounds: No murmur heard.   Pulmonary:     Effort: Pulmonary effort is normal. No respiratory distress.     Breath sounds: Normal breath sounds. No stridor.  Abdominal:     General: Bowel sounds are normal. There is no distension.     Palpations: Abdomen is soft. There is no mass.     Tenderness: There is no guarding.     Hernia: No hernia is present.     Comments: Ileostomy in place.  Working  well.  Lower midline laparotomy healing well.  No hernias, no infection.  No peritonitis  Musculoskeletal:        General: No swelling or tenderness. Normal range of motion.     Cervical back: Normal range of motion and neck supple. No rigidity or tenderness.  Skin:    General: Skin is warm and dry.     Capillary Refill: Capillary refill takes less than 2 seconds.  Neurological:     General: No focal deficit present.     Mental Status: He is alert and oriented to person, place, and time.  Psychiatric:        Mood and Affect: Mood normal.        Behavior: Behavior normal.        Thought Content: Thought content normal.        Judgment: Judgment normal.         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  To have a stricture abdomen prior coloproctostomy s/p post dilation. For doing an ileostomy takedown I would like to confirm that this is widely patent.  Currently because of the Covid search we are not doing any elective surgeries that will require hospitalization.  Discussed with the patient about her current situation.  He is very understanding.  We will see him back in about a month and hopefully at that time we will be able to schedule potential ileostomy reversal.  Currently there is no need for any emergent surgical intervention at this time. Greater than 50% of the 40 minutes  visit was spent in counseling/coordination of care   [name], MD [place] Surgeon

## 2019-11-26 ENCOUNTER — Other Ambulatory Visit: Payer: Self-pay | Admitting: Surgery

## 2019-11-26 ENCOUNTER — Ambulatory Visit
Admission: RE | Admit: 2019-11-26 | Discharge: 2019-11-26 | Disposition: A | Discharge status: Home / Self Care | Payer: No Typology Code available for payment source | Source: Ambulatory Visit | Attending: Surgery | Admitting: Surgery

## 2019-11-26 ENCOUNTER — Other Ambulatory Visit: Payer: Self-pay

## 2019-11-26 DIAGNOSIS — C187 Malignant neoplasm of sigmoid colon: Secondary | ICD-10-CM | POA: Insufficient documentation

## 2019-12-17 ENCOUNTER — Ambulatory Visit (INDEPENDENT_AMBULATORY_CARE_PROVIDER_SITE_OTHER): Payer: No Typology Code available for payment source | Admitting: Surgery

## 2019-12-17 ENCOUNTER — Encounter: Payer: Self-pay | Admitting: Surgery

## 2019-12-17 ENCOUNTER — Other Ambulatory Visit: Payer: Self-pay

## 2019-12-17 VITALS — BP 144/77 | HR 73 | Temp 97.9°F | Resp 12 | Ht 76.0 in | Wt 199.6 lb

## 2019-12-17 DIAGNOSIS — C187 Malignant neoplasm of sigmoid colon: Secondary | ICD-10-CM | POA: Diagnosis not present

## 2019-12-17 MED ORDER — NEOMYCIN SULFATE 500 MG PO TABS: ORAL_TABLET | ORAL | 0 refills | Status: DC

## 2019-12-17 MED ORDER — METRONIDAZOLE 500 MG PO TABS: ORAL_TABLET | ORAL | 0 refills | Status: DC

## 2019-12-17 MED ORDER — BISACODYL 5 MG PO TBEC: DELAYED_RELEASE_TABLET | ORAL | 0 refills | Status: DC

## 2019-12-17 MED ORDER — POLYETHYLENE GLYCOL 3350 17 GM/SCOOP PO POWD: ORAL | 0 refills | Status: DC

## 2019-12-17 NOTE — Patient Instructions (Signed)
Our surgery scheduler will contact you when it is okay to proceed with surgery.   In the meantime, we have given you a white bowel prep sheet to follow when surgery is scheduled. Pick up your medications at your local pharmacy.   Also a blue surgery was given to you, please have that in hand when our surgery scheduler contacts you.   Call the office if you have any questions or concerns.

## 2019-12-19 ENCOUNTER — Encounter: Payer: Self-pay | Admitting: Surgery

## 2019-12-19 NOTE — H&P (View-Only) (Signed)
Outpatient Surgical Follow Up  12/19/2019  Keith Trujillo is an [age] y.o. Trujillo.   Chief Complaint  Patient presents with  . 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  He comes in for further evaluation and possible ileostomy takedown.  He is doing well.  He recovered quite well from chemotherapy.  Has no complaints.  He did get Covid and treatment, did not require hospitalization and he did get abdominal antibodies. No fevers no chills.  His ileostomy is working well.  He is walking and he is tolerating regular diet.  Recent BE pers. Reviewed showing widely patent anastomosis.   Past Medical History:  Diagnosis Date  . Abdominal pain   . Allergy   . Cancer ([name])   . Cellulitis   . Colon cancer ([name])   . Dyspnea   . Liver spot   . Loose stools   . Weight loss     Past Surgical History:  Procedure Laterality Date  . COLONOSCOPY WITH PROPOFOL N/A 01/02/2019   Procedure: COLONOSCOPY WITH BIOPSY;  Surgeon: [name], MD;  Location: [place];  Service: Endoscopy;  Laterality: N/A;  Clip x2 at Biopsy Site  . COLOSTOMY Right 02/06/2019   Procedure: COLOSTOMY;  Surgeon: [name], MD;  Location: [facility];  Service: General;  Laterality: Right;  . excision of skin lesion    . FLEXIBLE SIGMOIDOSCOPY N/A 10/23/2019   Procedure: FLEXIBLE SIGMOIDOSCOPY WITH BIOPSY and DILATION;  Surgeon: [name], MD;  Location: [place];  Service: Endoscopy;  Laterality: N/A;  needs port accessed  . LAPAROSCOPIC SIGMOID COLECTOMY N/A 02/06/2019   Procedure: LAPAROSCOPIC SIGMOID COLECTOMY converted to open procedure;  Surgeon: [name], MD;  Location: [facility];  Service: General;  Laterality: N/A;  . POLYPECTOMY N/A 10/23/2019   Procedure: POLYPECTOMY;  Surgeon: [name], MD;  Location: [place];  Service: Endoscopy;  Laterality: N/A;   . PORTACATH PLACEMENT Right 03/06/2019   Procedure: INSERTION PORT-A-CATH;  Surgeon: [name], MD;  Location: [facility];  Service: General;  Laterality: Right;  . TONSILLECTOMY AND ADENOIDECTOMY  1969    Family History  Problem Relation Age of Onset  . Lung cancer Father     Social History:  reports that he has been smoking cigars. He quit smokeless tobacco use about 41 years ago. He reports previous alcohol use. He reports that he does not use drugs.  Allergies:  Allergies  Allergen Reactions  . Sulfa Antibiotics Rash    Medications reviewed.    ROS Full ROS performed and is otherwise negative other than what is stated in HPI   BP (!) 144/77   Pulse 73   Temp 97.9 F (36.6 C)   Resp 12   Ht 6\' 4"  (1.93 m)   Wt 199 lb 9.6 oz (90.5 kg)   SpO2 98%   BMI 24.30 kg/m   Physical Exam Vitals and nursing note reviewed. Exam conducted with a chaperone present.  Constitutional:      General: He is not in acute distress.    Appearance: Normal appearance. He is normal weight.  Abdominal:     General: Abdomen is flat. There is no distension.     Palpations: Abdomen is soft. There is no mass.     Tenderness: There is no abdominal tenderness. There is no guarding or rebound.     Hernia: No  hernia is present.     Comments: Ileostomy pink and patent  Musculoskeletal:        General: No swelling or tenderness. Normal range of motion.     Cervical back: Normal range of motion and neck supple. No rigidity or tenderness.  Skin:    General: Skin is warm and dry.     Capillary Refill: Capillary refill takes less than 2 seconds.  Neurological:     General: No focal deficit present.     Mental Status: He is alert and oriented to person, place, and time.  Psychiatric:        Mood and Affect: Mood normal.        Behavior: Behavior normal.        Thought Content: Thought content normal.        Judgment: Judgment normal.     Assessment/Plan: Keith Trujillo with history  of rectosigmoid carcinoma status post chemotherapy he is ready for ileostomy takedown.  barium enema shows  Wildly patent anastomosis . No Clinical issues.  Given covid state and restriction in elective procedure that required hospitalization we will have to post-pone his surgery. I will see him back in a few weeks and hopefully we will have clear light at that time to schedule his surgery  Greater than 50% of the 20 minutes  visit was spent in counseling/coordination of care   [name], MD [place] Surgeon

## 2019-12-19 NOTE — Progress Notes (Signed)
Outpatient Surgical Follow Up  12/19/2019  Keith Trujillo is an 59 y.o. male.   Chief Complaint  Patient presents with  . 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  He comes in for further evaluation and possible ileostomy takedown.  He is doing well.  He recovered quite well from chemotherapy.  Has no complaints.  He did get Covid and treatment, did not require hospitalization and he did get abdominal antibodies. No fevers no chills.  His ileostomy is working well.  He is walking and he is tolerating regular diet.  Recent BE pers. Reviewed showing widely patent anastomosis.   Past Medical History:  Diagnosis Date  . Abdominal pain   . Allergy   . Cancer (Keith Trujillo)   . Cellulitis   . Colon cancer (Keith Trujillo)   . Dyspnea   . Liver spot   . Loose stools   . Weight loss     Past Surgical History:  Procedure Laterality Date  . COLONOSCOPY WITH PROPOFOL N/A 01/02/2019   Procedure: COLONOSCOPY WITH BIOPSY;  Surgeon: [name], MD;  Location: [place];  Service: Endoscopy;  Laterality: N/A;  Clip x2 at Biopsy Site  . COLOSTOMY Right 02/06/2019   Procedure: COLOSTOMY;  Surgeon: [name], MD;  Location: [facility];  Service: General;  Laterality: Right;  . excision of skin lesion    . FLEXIBLE SIGMOIDOSCOPY N/A 10/23/2019   Procedure: FLEXIBLE SIGMOIDOSCOPY WITH BIOPSY and DILATION;  Surgeon: [name], MD;  Location: [place];  Service: Endoscopy;  Laterality: N/A;  needs port accessed  . LAPAROSCOPIC SIGMOID COLECTOMY N/A 02/06/2019   Procedure: LAPAROSCOPIC SIGMOID COLECTOMY converted to open procedure;  Surgeon: [name], MD;  Location: [facility];  Service: General;  Laterality: N/A;  . POLYPECTOMY N/A 10/23/2019   Procedure: POLYPECTOMY;  Surgeon: [name], MD;  Location: [place];  Service: Endoscopy;  Laterality: N/A;   . PORTACATH PLACEMENT Right 03/06/2019   Procedure: INSERTION PORT-A-CATH;  Surgeon: [name], MD;  Location: [facility];  Service: General;  Laterality: Right;  . TONSILLECTOMY AND ADENOIDECTOMY  1969    Family History  Problem Relation Age of Onset  . Lung cancer Father     Social History:  reports that he has been smoking cigars. He quit smokeless tobacco use about 41 years ago. He reports previous alcohol use. He reports that he does not use drugs.  Allergies:  Allergies  Allergen Reactions  . Sulfa Antibiotics Rash    Medications reviewed.    ROS Full ROS performed and is otherwise negative other than what is stated in HPI   BP (!) 144/77   Pulse 73   Temp 97.9 F (36.6 C)   Resp 12   Ht 6\' 4"  (1.93 m)   Wt 199 lb 9.6 oz (90.5 kg)   SpO2 98%   BMI 24.30 kg/m   Physical Exam Vitals and nursing note reviewed. Exam conducted with a chaperone present.  Constitutional:      General: He is not in acute distress.    Appearance: Normal appearance. He is normal weight.  Abdominal:     General: Abdomen is flat. There is no distension.     Palpations: Abdomen is soft. There is no mass.     Tenderness: There is no abdominal tenderness. There is no guarding or rebound.     Hernia: No  hernia is present.     Comments: Ileostomy pink and patent  Musculoskeletal:        General: No swelling or tenderness. Normal range of motion.     Cervical back: Normal range of motion and neck supple. No rigidity or tenderness.  Skin:    General: Skin is warm and dry.     Capillary Refill: Capillary refill takes less than 2 seconds.  Neurological:     General: No focal deficit present.     Mental Status: He is alert and oriented to person, place, and time.  Psychiatric:        Mood and Affect: Mood normal.        Behavior: Behavior normal.        Thought Content: Thought content normal.        Judgment: Judgment normal.     Assessment/Plan: 58 year Trujillo male with history  of rectosigmoid carcinoma status post chemotherapy he is ready for ileostomy takedown.  barium enema shows  Wildly patent anastomosis . No Clinical issues.  Given covid state and restriction in elective procedure that required hospitalization we will have to post-pone his surgery. I will see him back in a few weeks and hopefully we will have clear light at that time to schedule his surgery  Greater than 50% of the 20 minutes  visit was spent in counseling/coordination of care   [name], MD [place] Surgeon

## 2019-12-25 ENCOUNTER — Encounter: Payer: Self-pay | Admitting: Oncology

## 2019-12-26 ENCOUNTER — Encounter: Payer: Self-pay | Admitting: Oncology

## 2019-12-26 ENCOUNTER — Inpatient Hospital Stay (HOSPITAL_BASED_OUTPATIENT_CLINIC_OR_DEPARTMENT_OTHER): Payer: No Typology Code available for payment source | Admitting: Oncology

## 2019-12-26 ENCOUNTER — Other Ambulatory Visit: Payer: Self-pay

## 2019-12-26 ENCOUNTER — Inpatient Hospital Stay: Payer: No Typology Code available for payment source | Attending: Oncology

## 2019-12-26 VITALS — BP 113/72 | HR 62 | Temp 98.0°F | Resp 20 | Wt 198.4 lb

## 2019-12-26 DIAGNOSIS — R634 Abnormal weight loss: Secondary | ICD-10-CM | POA: Insufficient documentation

## 2019-12-26 DIAGNOSIS — C187 Malignant neoplasm of sigmoid colon: Secondary | ICD-10-CM

## 2019-12-26 DIAGNOSIS — G620 Drug-induced polyneuropathy: Secondary | ICD-10-CM | POA: Diagnosis not present

## 2019-12-26 DIAGNOSIS — F17210 Nicotine dependence, cigarettes, uncomplicated: Secondary | ICD-10-CM | POA: Insufficient documentation

## 2019-12-26 DIAGNOSIS — L0390 Cellulitis, unspecified: Secondary | ICD-10-CM | POA: Insufficient documentation

## 2019-12-26 DIAGNOSIS — Z08 Encounter for follow-up examination after completed treatment for malignant neoplasm: Secondary | ICD-10-CM | POA: Diagnosis not present

## 2019-12-26 DIAGNOSIS — Z79899 Other long term (current) drug therapy: Secondary | ICD-10-CM | POA: Insufficient documentation

## 2019-12-26 DIAGNOSIS — D696 Thrombocytopenia, unspecified: Secondary | ICD-10-CM | POA: Insufficient documentation

## 2019-12-26 DIAGNOSIS — T451X5A Adverse effect of antineoplastic and immunosuppressive drugs, initial encounter: Secondary | ICD-10-CM | POA: Insufficient documentation

## 2019-12-26 DIAGNOSIS — Z85038 Personal history of other malignant neoplasm of large intestine: Secondary | ICD-10-CM

## 2019-12-26 LAB — CBC WITH DIFFERENTIAL/PLATELET
Abs Immature Granulocytes: 0.01 10*3/uL (ref 0.00–0.07)
Basophils Absolute: 0 10*3/uL (ref 0.0–0.1)
Basophils Relative: 1 %
Eosinophils Absolute: 0.1 10*3/uL (ref 0.0–0.5)
Eosinophils Relative: 2 %
HCT: 35.1 % — ABNORMAL LOW (ref 39.0–52.0)
Hemoglobin: 12 g/dL — ABNORMAL LOW (ref 13.0–17.0)
Immature Granulocytes: 0 %
Lymphocytes Relative: 23 %
Lymphs Abs: 0.8 10*3/uL (ref 0.7–4.0)
MCH: 31.9 pg (ref 26.0–34.0)
MCHC: 34.2 g/dL (ref 30.0–36.0)
MCV: 93.4 fL (ref 80.0–100.0)
Monocytes Absolute: 0.3 10*3/uL (ref 0.1–1.0)
Monocytes Relative: 9 %
Neutro Abs: 2.2 10*3/uL (ref 1.7–7.7)
Neutrophils Relative %: 65 %
Platelets: 109 10*3/uL — ABNORMAL LOW (ref 150–400)
RBC: 3.76 MIL/uL — ABNORMAL LOW (ref 4.22–5.81)
RDW: 11.9 % (ref 11.5–15.5)
WBC: 3.3 10*3/uL — ABNORMAL LOW (ref 4.0–10.5)
nRBC: 0 % (ref 0.0–0.2)

## 2019-12-26 LAB — COMPREHENSIVE METABOLIC PANEL
ALT: 29 U/L (ref 0–44)
AST: 25 U/L (ref 15–41)
Albumin: 4.2 g/dL (ref 3.5–5.0)
Alkaline Phosphatase: 65 U/L (ref 38–126)
Anion gap: 7 (ref 5–15)
BUN: 14 mg/dL (ref 6–20)
CO2: 24 mmol/L (ref 22–32)
Calcium: 8.7 mg/dL — ABNORMAL LOW (ref 8.9–10.3)
Chloride: 107 mmol/L (ref 98–111)
Creatinine, Ser: 0.7 mg/dL (ref 0.61–1.24)
GFR calc non Af Amer: >60 mL/min (ref >60)
Glucose, Bld: 105 mg/dL — ABNORMAL HIGH (ref 70–99)
Potassium: 4 mmol/L (ref 3.5–5.1)
Sodium: 138 mmol/L (ref 135–145)
Total Bilirubin: 0.8 mg/dL (ref 0.3–1.2)
Total Protein: 6.6 g/dL (ref 6.5–8.1)

## 2019-12-26 MED ORDER — HEPARIN SOD (PORK) LOCK FLUSH 100 UNIT/ML IV SOLN
500.0000 [IU] | Freq: Once | INTRAVENOUS | Status: AC
  Administered 2019-12-26: 500 [IU] via INTRAVENOUS
  Filled 2019-12-26: qty 5

## 2019-12-26 MED ORDER — SODIUM CHLORIDE 0.9% FLUSH
10.0000 mL | Freq: Once | INTRAVENOUS | Status: AC
  Administered 2019-12-26: 10 mL via INTRAVENOUS
  Filled 2019-12-26: qty 10

## 2019-12-26 MED ORDER — HEPARIN SOD (PORK) LOCK FLUSH 100 UNIT/ML IV SOLN
INTRAVENOUS | Status: AC
  Filled 2019-12-26: qty 5

## 2019-12-27 LAB — CEA: CEA: 1.7 ng/mL (ref 0.0–4.7)

## 2019-12-29 ENCOUNTER — Telehealth: Payer: Self-pay | Admitting: Surgery

## 2019-12-29 NOTE — Telephone Encounter (Signed)
Received incoming call from the patient.  He is informed of all dates regarding his surgery and voices understanding.  He is also informed of the colon prep for surgery and voices understanding with this as well.

## 2019-12-29 NOTE — Telephone Encounter (Signed)
Outgoing call made, left message for patient to call.  Please advise patient of Pre-Admission date/time, COVID Testing date and Surgery date.  Surgery Date: 01/13/20 Preadmission Testing Date: 01/05/20 (phone 8a-1p) Covid Testing Date: 01/09/20 - patient advised to go to the [facility] between 8a-1p  Also patient needs to call [phone], between 1-3:00pm the day before surgery, to find out what time to arrive for surgery.

## 2019-12-30 NOTE — Progress Notes (Signed)
Hematology/Oncology Consult note [facility]  [phone] [phone]  Patient Care Team: Kendell Bane, NP as PCP - General (Adult Health Nurse Practitioner) Clent Jacks, RN as Oncology Nurse Navigator Sindy Guadeloupe, MD as Consulting Physician (Oncology)   Name of the patient: Keith Trujillo  [id number]  05-01-1960   Date of visit: 12/30/19  Diagnosis- stage IIIb colon adenocarcinoma  Chief complaint/ Reason for visit-routine follow-up of colon cancer  Heme/Onc history: patient is a 59 year old male who was seen by Dr. Remi Haggard GI for symptoms of diarrhea as well as abdominal pain.He underwent a colonoscopy on 01/02/2019 which showed an ulcerated partially obstructing large mass in the sigmoid colon which measured 10 cm in length biopsy showed adenocarcinoma moderately differentiated. Baseline CEA elevated at 71.7. He was seen by Dr. Dahlia Byes and will be undergoing laparoscopic hemicolectomy and lymph node sampling next week.  CT abdomen pelvis with contrast showed irregular marked circumferential wall thickening in the sigmoid colon near the rectosigmoid junction. The lesion longitudinally extends for approximately 5 to 6 cm. 4.1 x 2.8 cm ill-defined irregular paracolonic soft tissue mass may represent direct tumor extension or contiguous markedly enlarged lymph node. Abnormal lymph nodes also seen in the perirectal and presacral space concerning for metastatic disease. 1.5 cm ill-defined hypoattenuating lesion in the medial segment of the left liver which may be focal fatty deposition but cannot be definitively characterized.MRI did not show any evidence of liver metastases. CT chest was negative for metastatic disease.  Patient underwent a low anterior resection of the sigmoid mass. Final pathology showed 8 cm invasive colorectal adenocarcinoma grade 2. Tumor invades through the muscularis propria into pericolorectal tissue.  All margins are uninvolvedLymphovascular invasion and perineural invasion present. Lymph nodes 1+ out of 20 PT3PN1A. MSI stable  Patient completed 12 cycles of adjuvant FOLFOX chemotherapy on 09/16/2019  Interval history-patient reports that his neuropathy has improved significantly.  He is still awaiting to undergo colostomy takedown.  Appetite and weight is stable.  Bowel movements have been regular with no ostomy issues.  ECOG PS- 1 Pain scale- 0   Review of systems- Review of Systems  Constitutional: Negative for chills, fever, malaise/fatigue and weight loss.  HENT: Negative for congestion, ear discharge and nosebleeds.   Eyes: Negative for blurred vision.  Respiratory: Negative for cough, hemoptysis, sputum production, shortness of breath and wheezing.   Cardiovascular: Negative for chest pain, palpitations, orthopnea and claudication.  Gastrointestinal: Negative for abdominal pain, blood in stool, constipation, diarrhea, heartburn, melena, nausea and vomiting.  Genitourinary: Negative for dysuria, flank pain, frequency, hematuria and urgency.  Musculoskeletal: Negative for back pain, joint pain and myalgias.  Skin: Negative for rash.  Neurological: Positive for sensory change (Peripheral neuropathy). Negative for dizziness, tingling, focal weakness, seizures, weakness and headaches.  Endo/Heme/Allergies: Does not bruise/bleed easily.  Psychiatric/Behavioral: Negative for depression and suicidal ideas. The patient does not have insomnia.        Allergies  Allergen Reactions   Sulfa Antibiotics Rash     Past Medical History:  Diagnosis Date   Abdominal pain    Allergy    Cancer ([place])    Cellulitis    Colon cancer ([facility])    Dyspnea    Liver spot    Loose stools    Weight loss      Past Surgical History:  Procedure Laterality Date   COLONOSCOPY WITH PROPOFOL N/A 01/02/2019   Procedure: COLONOSCOPY WITH BIOPSY;  Surgeon: [name], MD;  Location:  [place];  Service: Endoscopy;  Laterality: N/A;  Clip x2 at Biopsy Site   COLOSTOMY Right 02/06/2019   Procedure: COLOSTOMY;  Surgeon: [name], MD;  Location: [facility];  Service: General;  Laterality: Right;   excision of skin lesion     FLEXIBLE SIGMOIDOSCOPY N/A 10/23/2019   Procedure: FLEXIBLE SIGMOIDOSCOPY WITH BIOPSY and DILATION;  Surgeon: [name], MD;  Location: [place];  Service: Endoscopy;  Laterality: N/A;  needs port accessed   LAPAROSCOPIC SIGMOID COLECTOMY N/A 02/06/2019   Procedure: LAPAROSCOPIC SIGMOID COLECTOMY converted to open procedure;  Surgeon: [name], MD;  Location: [facility];  Service: General;  Laterality: N/A;   POLYPECTOMY N/A 10/23/2019   Procedure: POLYPECTOMY;  Surgeon: [name], MD;  Location: [place];  Service: Endoscopy;  Laterality: N/A;   PORTACATH PLACEMENT Right 03/06/2019   Procedure: INSERTION PORT-A-CATH;  Surgeon: [name], MD;  Location: [facility];  Service: General;  Laterality: Right;   TONSILLECTOMY AND ADENOIDECTOMY  1969    Social History   Socioeconomic History   Marital status: Married    Spouse name: Not on file   Number of children: Not on file   Years of education: Not on file   Highest education level: Not on file  Occupational History   Not on file  Tobacco Use   Smoking status: Light Tobacco Smoker    Types: Cigars   Smokeless tobacco: Former Systems developer    Quit date: 1980  Scientific laboratory technician Use: Never used  Substance and Sexual Activity   Alcohol use: Not Currently    Comment: rare   Drug use: Never   Sexual activity: Yes  Other Topics Concern   Not on file  Social History Narrative   Not on file   Social Determinants of Health   Financial Resource Strain:    Difficulty of Paying Living Expenses: Not on file  Food Insecurity:    Worried About Charity fundraiser in the Last Year: Not on file   [employer] of Food in the Last Year: Not on file   Transportation Needs:    Lack of Transportation (Medical): Not on file   Lack of Transportation (Non-Medical): Not on file  Physical Activity:    Days of Exercise per Week: Not on file   Minutes of Exercise per Session: Not on file  Stress:    Feeling of Stress : Not on file  Social Connections:    Frequency of Communication with Friends and Family: Not on file   Frequency of Social Gatherings with Friends and Family: Not on file   Attends Religious Services: Not on file   Active Member of Clubs or Organizations: Not on file   Attends Archivist Meetings: Not on file   Marital Status: Not on file  Intimate Partner Violence:    Fear of Current or Ex-Partner: Not on file   Emotionally Abused: Not on file   Physically Abused: Not on file   Sexually Abused: Not on file    Family History  Problem Relation Age of Onset   Lung cancer Father      Current Outpatient Medications:    bisacodyl (DULCOLAX) 5 MG EC tablet, Take all 4 tablets at 8 am the morning prior to your surgery. (Patient taking differently: Take 20 mg by mouth See admin instructions. Take 20 mg at 8 am the morning prior to your surgery.), Disp: 4 tablet, Rfl: 0  lidocaine-prilocaine (EMLA) cream, Apply to affected area once (Patient not taking: Reported on 12/30/2019), Disp: 30 g, Rfl: 3   loratadine (CLARITIN) 10 MG tablet, Take 10 mg by mouth daily. , Disp: , Rfl:    metroNIDAZOLE (FLAGYL) 500 MG tablet, Take 2 tabs at 8 am, take 2 tabs at 2 pm, and take 2 tabs at 8pm the day prior to surgery. (Patient taking differently: Take 1,000 mg by mouth See admin instructions. Take 1000 mg at 8am, take1000 mg at 2pm, and take 1000 mg at 8pm the day prior to your surgery), Disp: 6 tablet, Rfl: 0   Multiple Vitamin (MULTIVITAMIN) tablet, Take 1 tablet by mouth daily. Centrum, Disp: , Rfl:    neomycin (MYCIFRADIN) 500 MG tablet, Take 2 tablet at 8am, take 2 tablets at 2pm, and take 2 tablets at 8pm  the day prior to your surgery (Patient taking differently: Take 1,000 mg by mouth See admin instructions. Take 1000 mg at 8am, take1000 mg at 2pm, and take 1000 mg at 8pm the day prior to your surgery), Disp: 6 tablet, Rfl: 0   polyethylene glycol powder (MIRALAX) 17 GM/SCOOP powder, Mix full container in 64 ounces of Gatorade or other clear liquid., Disp: 238 g, Rfl: 0   Loperamide-Simethicone (IMODIUM MULTI-SYMPTOM RELIEF PO), Take 1 tablet by mouth daily., Disp: , Rfl:  No current facility-administered medications for this visit.  Facility-Administered Medications Ordered in Other Visits:    sodium chloride flush (NS) 0.9 % injection 10 mL, 10 mL, Intravenous, PRN, Sindy Guadeloupe, MD, 10 mL at 09/02/19 0832  Physical exam:  Vitals:   12/26/19 0917  BP: 113/72  Pulse: 62  Resp: 20  Temp: 98 F (36.7 C)  SpO2: 100%  Weight: 198 lb 6.4 oz (90 kg)   Physical Exam Constitutional:      General: He is not in acute distress. Cardiovascular:     Rate and Rhythm: Normal rate and regular rhythm.     Heart sounds: Normal heart sounds.  Pulmonary:     Effort: Pulmonary effort is normal.     Breath sounds: Normal breath sounds.  Abdominal:     General: Bowel sounds are normal.     Palpations: Abdomen is soft.  Skin:    General: Skin is warm and dry.  Neurological:     Mental Status: He is alert and oriented to person, place, and time.      CMP Latest Ref Rng & Units 12/26/2019  Glucose 70 - 99 mg/dL 105(H)  BUN 6 - 20 mg/dL 14  Creatinine 0.61 - 1.24 mg/dL 0.70  Sodium 135 - 145 mmol/L 138  Potassium 3.5 - 5.1 mmol/L 4.0  Chloride 98 - 111 mmol/L 107  CO2 22 - 32 mmol/L 24  Calcium 8.9 - 10.3 mg/dL 8.7(L)  Total Protein 6.5 - 8.1 g/dL 6.6  Total Bilirubin 0.3 - 1.2 mg/dL 0.8  Alkaline Phos 38 - 126 U/L 65  AST 15 - 41 U/L 25  ALT 0 - 44 U/L 29   CBC Latest Ref Rng & Units 12/26/2019  WBC 4.0 - 10.5 K/uL 3.3(L)  Hemoglobin 13.0 - 17.0 g/dL 12.0(L)  Hematocrit 39 - 52 %  35.1(L)  Platelets 150 - 400 K/uL 109(L)     Assessment and plan- Patient is a 59 y.o. male  with sigmoid adenocarcinoma stage IIIB pT3PN1A s/p low anterior resection.Patient is s/p 12 cycles of adjuvant FOLFOX chemotherapy and this is a routine follow-up visit  Clinically patient is doing  well with no concerning signs and symptoms of recurrence based on today's exam.  CEA is normal.His insurance would only allow for yearly scans and therefore he will be getting repeat scans in July 2021.  He is scheduled for a colostomy takedown hopefully by next month.  Chemo-induced peripheral neuropathy: Improving.  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  It is stable but it has not normalized.  Continue to monitor   Visit Diagnosis 1. Chemotherapy-induced peripheral neuropathy ([place])   2. Encounter for follow-up surveillance of colon cancer   3. Cancer of sigmoid [facility])      Dr. Randa Evens, MD, MPH [facility] [phone] 12/30/2019 11:30 AM

## 2020-01-05 ENCOUNTER — Telehealth: Payer: Self-pay | Admitting: *Deleted

## 2020-01-05 ENCOUNTER — Telehealth: Payer: Self-pay

## 2020-01-05 ENCOUNTER — Encounter
Admission: RE | Admit: 2020-01-05 | Discharge: 2020-01-05 | Disposition: A | Discharge status: Home / Self Care | Payer: No Typology Code available for payment source | Source: Ambulatory Visit | Attending: Surgery | Admitting: Surgery

## 2020-01-05 ENCOUNTER — Other Ambulatory Visit: Payer: Self-pay

## 2020-01-05 MED ORDER — ACETAMINOPHEN 160 MG/5ML PO SOLN
325.0000 mg | ORAL | Status: DC | PRN
  Filled 2020-01-05: qty 20.3

## 2020-01-05 MED ORDER — ACETAMINOPHEN 325 MG PO TABS
325.0000 mg | ORAL_TABLET | ORAL | Status: DC | PRN
  Filled 2020-01-05: qty 2

## 2020-01-05 MED ORDER — ONDANSETRON HCL 4 MG/2ML IJ SOLN
4.0000 mg | Freq: Once | INTRAMUSCULAR | Status: DC | PRN
  Filled 2020-01-05: qty 2

## 2020-01-05 NOTE — Telephone Encounter (Signed)
Consent to remove port a cath faxed to pre-admit at this time. Placed in scan bin as well.

## 2020-01-05 NOTE — Telephone Encounter (Signed)
Niki faxed the port removal  to Dr. Dahlia Byes  office

## 2020-01-05 NOTE — Telephone Encounter (Signed)
Spoke with Melissa at the cancer center-need to obtain consent for port a cath removal.

## 2020-01-05 NOTE — Patient Instructions (Addendum)
Your procedure is scheduled on:  Tuesday, October 26 Report to Day Surgery on the 2nd floor of the [facility]. To find out your arrival time, please call [phone] between 1PM - 3PM on: Monday, October 25  REMEMBER: Instructions that are not followed completely may result in serious medical risk, up to and including death; or upon the discretion of your surgeon and anesthesiologist your surgery may need to be rescheduled.  Do not eat food after midnight the night before surgery.  No gum chewing, lozengers or hard candies.  You may however, drink water up to 2 hours before you are scheduled to arrive for your surgery. Do not drink anything within 2 hours of your scheduled arrival time.  DO NOT TAKE ANY MEDICATIONS THE MORNING OF SURGERY   One week prior to surgery: Stop Anti-inflammatories (NSAIDS) such as Advil, Aleve, Ibuprofen, Motrin, Naproxen, Naprosyn and Aspirin based products such as Excedrin, Goodys Powder, BC Powder. Stop ANY OVER THE COUNTER supplements until after surgery. (You may continue taking Tylenol, multivitamin.)  No Alcohol for 24 hours before or after surgery.  No Smoking including e-cigarettes for 24 hours prior to surgery.  No chewable tobacco products for at least 6 hours prior to surgery.  No nicotine patches on the day of surgery.  On the morning of surgery brush your teeth with toothpaste and water, you may rinse your mouth with mouthwash if you wish. Do not swallow any toothpaste or mouthwash.  Do not wear jewelry.  Do not wear lotions, powders, or perfumes.   Do not shave 48 hours prior to surgery.   Do not bring valuables to the hospital. [facility] is not responsible for any missing/lost belongings or valuables.   Use CHG Soap as directed on instruction sheet.  BOWEL PREP AS DIRECTED BY YOUR SURGEON.  Notify your doctor if there is any change in your medical condition (cold, fever, infection).  Wear comfortable clothing (specific to  your surgery type) to the hospital.  Plan for stool softeners for home use; pain medications have a tendency to cause constipation. You can also help prevent constipation by eating foods high in fiber such as fruits and vegetables and drinking plenty of fluids as your diet allows.  After surgery, you can help prevent lung complications by doing breathing exercises.  Take deep breaths and cough every 1-2 hours. Your doctor may order a device called an Incentive Spirometer to help you take deep breaths. When coughing or sneezing, hold a pillow firmly against your incision with both hands. This is called splinting. Doing this helps protect your incision. It also decreases belly discomfort.  If you are being admitted to the hospital overnight, leave your suitcase in the car. After surgery it may be brought to your room.  If you are being discharged the day of surgery, you will not be allowed to drive home. You will need a responsible adult (18 years or older) to drive you home and stay with you that night.   If you are taking public transportation, you will need to have a responsible adult (18 years or older) with you. Please confirm with your physician that it is acceptable to use public transportation.   Please call the [place] Dept. at [phone] if you have any questions about these instructions.  Visitation Policy:  Patients undergoing a surgery or procedure may have one family member or support person with them as long as that person is not COVID-19 positive or experiencing its symptoms.  That person may remain in the waiting area during the procedure.  Inpatient Visitation Update:   In an effort to ensure the safety of our team members and our patients, we are implementing a change to our visitation policy:  Effective Monday, Aug. 9, at 7 a.m., inpatients will be allowed one support person.  o The support person may change daily.  o The support person must pass  our screening, gel in and out, and wear a mask at all times, including in the patients room.  o Patients must also wear a mask when staff or their support person are in the room.  o Masking is required regardless of vaccination status.  Systemwide, no visitors 17 or younger.

## 2020-01-05 NOTE — Telephone Encounter (Signed)
Called asking for order for port removal to be faxed over to them

## 2020-01-05 NOTE — Addendum Note (Signed)
Addended by: [name] F on: 01/05/2020 09:27 AM   Modules accepted: Orders, SmartSet

## 2020-01-09 ENCOUNTER — Other Ambulatory Visit: Payer: Self-pay

## 2020-01-09 ENCOUNTER — Other Ambulatory Visit
Admission: RE | Admit: 2020-01-09 | Discharge: 2020-01-09 | Disposition: A | Discharge status: Home / Self Care | Payer: No Typology Code available for payment source | Source: Ambulatory Visit | Attending: Surgery | Admitting: Surgery

## 2020-01-09 DIAGNOSIS — Z01812 Encounter for preprocedural laboratory examination: Secondary | ICD-10-CM | POA: Diagnosis not present

## 2020-01-09 DIAGNOSIS — Z20822 Contact with and (suspected) exposure to covid-19: Secondary | ICD-10-CM | POA: Diagnosis not present

## 2020-01-10 LAB — SARS CORONAVIRUS 2 (TAT 6-24 HRS): SARS Coronavirus 2: NEGATIVE

## 2020-01-13 ENCOUNTER — Inpatient Hospital Stay: Payer: No Typology Code available for payment source

## 2020-01-13 ENCOUNTER — Encounter
Admission: RE | Disposition: A | Discharge status: Home / Self Care | Payer: Self-pay | Source: Home / Self Care | Attending: Surgery

## 2020-01-13 ENCOUNTER — Inpatient Hospital Stay
Admission: RE | Admit: 2020-01-13 | Discharge: 2020-01-15 | DRG: 331 | Disposition: A | Discharge status: Home / Self Care | Payer: No Typology Code available for payment source | Attending: Surgery | Admitting: Surgery

## 2020-01-13 ENCOUNTER — Other Ambulatory Visit: Payer: Self-pay

## 2020-01-13 ENCOUNTER — Encounter: Payer: Self-pay | Admitting: Surgery

## 2020-01-13 DIAGNOSIS — Z933 Colostomy status: Secondary | ICD-10-CM | POA: Diagnosis present

## 2020-01-13 DIAGNOSIS — K660 Peritoneal adhesions (postprocedural) (postinfection): Secondary | ICD-10-CM | POA: Diagnosis present

## 2020-01-13 DIAGNOSIS — Z9221 Personal history of antineoplastic chemotherapy: Secondary | ICD-10-CM | POA: Diagnosis not present

## 2020-01-13 DIAGNOSIS — Z882 Allergy status to sulfonamides status: Secondary | ICD-10-CM | POA: Diagnosis not present

## 2020-01-13 DIAGNOSIS — C187 Malignant neoplasm of sigmoid colon: Secondary | ICD-10-CM | POA: Diagnosis not present

## 2020-01-13 DIAGNOSIS — Z20822 Contact with and (suspected) exposure to covid-19: Secondary | ICD-10-CM | POA: Diagnosis present

## 2020-01-13 DIAGNOSIS — F17290 Nicotine dependence, other tobacco product, uncomplicated: Secondary | ICD-10-CM | POA: Diagnosis present

## 2020-01-13 DIAGNOSIS — Z9049 Acquired absence of other specified parts of digestive tract: Secondary | ICD-10-CM | POA: Diagnosis not present

## 2020-01-13 DIAGNOSIS — Z432 Encounter for attention to ileostomy: Secondary | ICD-10-CM | POA: Diagnosis not present

## 2020-01-13 DIAGNOSIS — Z85048 Personal history of other malignant neoplasm of rectum, rectosigmoid junction, and anus: Secondary | ICD-10-CM | POA: Diagnosis not present

## 2020-01-13 DIAGNOSIS — Z801 Family history of malignant neoplasm of trachea, bronchus and lung: Secondary | ICD-10-CM

## 2020-01-13 DIAGNOSIS — Z9889 Other specified postprocedural states: Secondary | ICD-10-CM

## 2020-01-13 HISTORY — PX: ILEOSTOMY CLOSURE: SHX1784

## 2020-01-13 HISTORY — PX: PORT-A-CATH REMOVAL: SHX5289

## 2020-01-13 LAB — CREATININE, SERUM
Creatinine, Ser: 1.03 mg/dL (ref 0.61–1.24)
GFR, Estimated: >60 mL/min (ref >60)

## 2020-01-13 LAB — CBC
HCT: 40.3 % (ref 39.0–52.0)
Hemoglobin: 13.5 g/dL (ref 13.0–17.0)
MCH: 31.5 pg (ref 26.0–34.0)
MCHC: 33.5 g/dL (ref 30.0–36.0)
MCV: 94.2 fL (ref 80.0–100.0)
Platelets: 132 10*3/uL — ABNORMAL LOW (ref 150–400)
RBC: 4.28 MIL/uL (ref 4.22–5.81)
RDW: 12.2 % (ref 11.5–15.5)
WBC: 9.1 10*3/uL (ref 4.0–10.5)
nRBC: 0 % (ref 0.0–0.2)

## 2020-01-13 SURGERY — CLOSURE, ILEOSTOMY
Anesthesia: General

## 2020-01-13 MED ORDER — ONDANSETRON 4 MG PO TBDP: 4.0000 mg | ORAL_TABLET | Freq: Four times a day (QID) | ORAL | Status: DC | PRN

## 2020-01-13 MED ORDER — FENTANYL CITRATE (PF) 100 MCG/2ML IJ SOLN
INTRAMUSCULAR | Status: AC
  Filled 2020-01-13: qty 2

## 2020-01-13 MED ORDER — SUGAMMADEX SODIUM 200 MG/2ML IV SOLN
INTRAVENOUS | Status: DC | PRN
  Administered 2020-01-13: 200 mg via INTRAVENOUS

## 2020-01-13 MED ORDER — KETAMINE HCL 50 MG/ML IJ SOLN
INTRAMUSCULAR | Status: DC | PRN
  Administered 2020-01-13: 50 mg via INTRAMUSCULAR

## 2020-01-13 MED ORDER — ONDANSETRON HCL 4 MG/2ML IJ SOLN
INTRAMUSCULAR | Status: DC | PRN
  Administered 2020-01-13: 4 mg via INTRAVENOUS

## 2020-01-13 MED ORDER — MEPERIDINE HCL 50 MG/ML IJ SOLN: 6.2500 mg | INTRAMUSCULAR | Status: DC | PRN

## 2020-01-13 MED ORDER — ONDANSETRON HCL 4 MG/2ML IJ SOLN
INTRAMUSCULAR | Status: AC
  Filled 2020-01-13: qty 2

## 2020-01-13 MED ORDER — ALVIMOPAN 12 MG PO CAPS
12.0000 mg | ORAL_CAPSULE | ORAL | Status: AC
  Administered 2020-01-14: 12 mg via ORAL

## 2020-01-13 MED ORDER — GLYCOPYRROLATE 0.2 MG/ML IJ SOLN
INTRAMUSCULAR | Status: AC
  Filled 2020-01-13: qty 1

## 2020-01-13 MED ORDER — OXYCODONE HCL 5 MG/5ML PO SOLN: 5.0000 mg | Freq: Once | ORAL | Status: DC | PRN

## 2020-01-13 MED ORDER — SODIUM CHLORIDE 0.9 % IV SOLN
2.0000 g | INTRAVENOUS | Status: AC
  Administered 2020-01-13 – 2020-01-14 (×2): 2 g via INTRAVENOUS

## 2020-01-13 MED ORDER — ENOXAPARIN SODIUM 40 MG/0.4ML ~~LOC~~ SOLN
40.0000 mg | SUBCUTANEOUS | Status: DC
  Administered 2020-01-14 – 2020-01-15 (×2): 40 mg via SUBCUTANEOUS
  Filled 2020-01-13 (×2): qty 0.4

## 2020-01-13 MED ORDER — ACETAMINOPHEN 10 MG/ML IV SOLN
INTRAVENOUS | Status: AC
  Filled 2020-01-13: qty 100

## 2020-01-13 MED ORDER — DEXAMETHASONE SODIUM PHOSPHATE 10 MG/ML IJ SOLN
INTRAMUSCULAR | Status: AC
  Filled 2020-01-13: qty 1

## 2020-01-13 MED ORDER — DIPHENHYDRAMINE HCL 12.5 MG/5ML PO ELIX
12.5000 mg | ORAL_SOLUTION | Freq: Four times a day (QID) | ORAL | Status: DC | PRN
  Filled 2020-01-13: qty 5

## 2020-01-13 MED ORDER — LIDOCAINE HCL (PF) 2 % IJ SOLN
INTRAMUSCULAR | Status: AC
  Filled 2020-01-13: qty 5

## 2020-01-13 MED ORDER — PROMETHAZINE HCL 25 MG/ML IJ SOLN: 6.2500 mg | INTRAMUSCULAR | Status: DC | PRN

## 2020-01-13 MED ORDER — LIDOCAINE HCL (CARDIAC) PF 100 MG/5ML IV SOSY
PREFILLED_SYRINGE | INTRAVENOUS | Status: DC | PRN
  Administered 2020-01-13: 80 mg via INTRAVENOUS

## 2020-01-13 MED ORDER — FENTANYL CITRATE (PF) 100 MCG/2ML IJ SOLN
INTRAMUSCULAR | Status: AC
Start: 2020-01-13 — End: ?
  Filled 2020-01-13: qty 2

## 2020-01-13 MED ORDER — PROPOFOL 10 MG/ML IV BOLUS
INTRAVENOUS | Status: DC | PRN
  Administered 2020-01-13: 160 mg via INTRAVENOUS

## 2020-01-13 MED ORDER — PHENYLEPHRINE HCL (PRESSORS) 10 MG/ML IV SOLN
INTRAVENOUS | Status: DC | PRN
  Administered 2020-01-13 (×13): 100 ug via INTRAVENOUS

## 2020-01-13 MED ORDER — GLYCOPYRROLATE 0.2 MG/ML IJ SOLN
INTRAMUSCULAR | Status: DC | PRN
  Administered 2020-01-13: .2 mg via INTRAVENOUS

## 2020-01-13 MED ORDER — SODIUM CHLORIDE (PF) 0.9 % IJ SOLN
INTRAMUSCULAR | Status: AC
  Filled 2020-01-13: qty 50

## 2020-01-13 MED ORDER — FENTANYL CITRATE (PF) 100 MCG/2ML IJ SOLN: 25.0000 ug | INTRAMUSCULAR | Status: DC | PRN

## 2020-01-13 MED ORDER — OXYCODONE HCL 5 MG PO TABS: 5.0000 mg | ORAL_TABLET | Freq: Once | ORAL | Status: DC | PRN

## 2020-01-13 MED ORDER — CHLORHEXIDINE GLUCONATE CLOTH 2 % EX PADS: 6.0000 | MEDICATED_PAD | Freq: Once | CUTANEOUS | Status: DC

## 2020-01-13 MED ORDER — KETOROLAC TROMETHAMINE 30 MG/ML IJ SOLN
30.0000 mg | Freq: Four times a day (QID) | INTRAMUSCULAR | Status: DC
  Administered 2020-01-14 – 2020-01-15 (×7): 30 mg via INTRAVENOUS
  Filled 2020-01-13 (×7): qty 1

## 2020-01-13 MED ORDER — CHLORHEXIDINE GLUCONATE 0.12 % MT SOLN: 15.0000 mL | Freq: Once | OROMUCOSAL | Status: AC

## 2020-01-13 MED ORDER — MIDAZOLAM HCL 2 MG/2ML IJ SOLN
INTRAMUSCULAR | Status: AC
  Filled 2020-01-13: qty 2

## 2020-01-13 MED ORDER — DIPHENHYDRAMINE HCL 50 MG/ML IJ SOLN: 12.5000 mg | Freq: Four times a day (QID) | INTRAMUSCULAR | Status: DC | PRN

## 2020-01-13 MED ORDER — ORAL CARE MOUTH RINSE: 15.0000 mL | Freq: Once | OROMUCOSAL | Status: AC

## 2020-01-13 MED ORDER — LACTATED RINGERS IV SOLN: INTRAVENOUS | Status: DC

## 2020-01-13 MED ORDER — SODIUM CHLORIDE 0.9 % IV SOLN
INTRAVENOUS | Status: DC | PRN
  Administered 2020-01-13: 50 mL

## 2020-01-13 MED ORDER — OXYCODONE HCL 5 MG PO TABS: 5.0000 mg | ORAL_TABLET | ORAL | Status: DC | PRN

## 2020-01-13 MED ORDER — KETAMINE HCL 50 MG/ML IJ SOLN
INTRAMUSCULAR | Status: AC
  Filled 2020-01-13: qty 10

## 2020-01-13 MED ORDER — HYDRALAZINE HCL 20 MG/ML IJ SOLN: 10.0000 mg | INTRAMUSCULAR | Status: DC | PRN

## 2020-01-13 MED ORDER — DEXAMETHASONE SODIUM PHOSPHATE 10 MG/ML IJ SOLN
INTRAMUSCULAR | Status: DC | PRN
  Administered 2020-01-13: 10 mg via INTRAVENOUS

## 2020-01-13 MED ORDER — SODIUM CHLORIDE 0.9 % IV SOLN
INTRAVENOUS | Status: AC
  Filled 2020-01-13: qty 2

## 2020-01-13 MED ORDER — FAMOTIDINE 20 MG PO TABS
ORAL_TABLET | ORAL | Status: AC
  Administered 2020-01-13: 20 mg via ORAL
  Filled 2020-01-13: qty 1

## 2020-01-13 MED ORDER — PANTOPRAZOLE SODIUM 40 MG IV SOLR
40.0000 mg | Freq: Every day | INTRAVENOUS | Status: DC
  Administered 2020-01-13 – 2020-01-14 (×2): 40 mg via INTRAVENOUS
  Filled 2020-01-13 (×2): qty 40

## 2020-01-13 MED ORDER — FENTANYL CITRATE (PF) 250 MCG/5ML IJ SOLN
INTRAMUSCULAR | Status: DC | PRN
  Administered 2020-01-13: 50 ug via INTRAVENOUS
  Administered 2020-01-13: 25 ug via INTRAVENOUS
  Administered 2020-01-13: 50 ug via INTRAVENOUS
  Administered 2020-01-13: 25 ug via INTRAVENOUS

## 2020-01-13 MED ORDER — ALVIMOPAN 12 MG PO CAPS
ORAL_CAPSULE | ORAL | Status: AC
  Administered 2020-01-13: 12 mg via ORAL
  Filled 2020-01-13: qty 1

## 2020-01-13 MED ORDER — ROCURONIUM BROMIDE 100 MG/10ML IV SOLN
INTRAVENOUS | Status: DC | PRN
  Administered 2020-01-13: 20 mg via INTRAVENOUS
  Administered 2020-01-13: 50 mg via INTRAVENOUS
  Administered 2020-01-13: 20 mg via INTRAVENOUS
  Administered 2020-01-13: 10 mg via INTRAVENOUS

## 2020-01-13 MED ORDER — ROCURONIUM BROMIDE 10 MG/ML (PF) SYRINGE
PREFILLED_SYRINGE | INTRAVENOUS | Status: AC
  Filled 2020-01-13: qty 10

## 2020-01-13 MED ORDER — SODIUM CHLORIDE 0.9 % IV SOLN
2.0000 g | Freq: Two times a day (BID) | INTRAVENOUS | Status: AC
  Administered 2020-01-13 – 2020-01-14 (×2): 2 g via INTRAVENOUS
  Filled 2020-01-13 (×2): qty 2

## 2020-01-13 MED ORDER — SODIUM CHLORIDE 0.9 % IV SOLN: INTRAVENOUS | Status: DC

## 2020-01-13 MED ORDER — CHLORHEXIDINE GLUCONATE 0.12 % MT SOLN
OROMUCOSAL | Status: AC
  Administered 2020-01-13: 15 mL via OROMUCOSAL
  Filled 2020-01-13: qty 15

## 2020-01-13 MED ORDER — MIDAZOLAM HCL 2 MG/2ML IJ SOLN
INTRAMUSCULAR | Status: DC | PRN
  Administered 2020-01-13: 2 mg via INTRAVENOUS

## 2020-01-13 MED ORDER — PROCHLORPERAZINE EDISYLATE 10 MG/2ML IJ SOLN: 5.0000 mg | Freq: Four times a day (QID) | INTRAMUSCULAR | Status: DC | PRN

## 2020-01-13 MED ORDER — ACETAMINOPHEN 10 MG/ML IV SOLN
INTRAVENOUS | Status: DC | PRN
  Administered 2020-01-13: 1000 mg via INTRAVENOUS

## 2020-01-13 MED ORDER — FAMOTIDINE 20 MG PO TABS: 20.0000 mg | ORAL_TABLET | Freq: Once | ORAL | Status: AC

## 2020-01-13 MED ORDER — PROPOFOL 10 MG/ML IV BOLUS
INTRAVENOUS | Status: AC
  Filled 2020-01-13: qty 20

## 2020-01-13 MED ORDER — LIDOCAINE HCL 4 % MT SOLN
OROMUCOSAL | Status: DC | PRN
  Administered 2020-01-13: 4 mL via TOPICAL

## 2020-01-13 MED ORDER — ACETAMINOPHEN 500 MG PO TABS
1000.0000 mg | ORAL_TABLET | Freq: Four times a day (QID) | ORAL | Status: DC
  Administered 2020-01-14 – 2020-01-15 (×7): 1000 mg via ORAL
  Filled 2020-01-13 (×7): qty 2

## 2020-01-13 MED ORDER — ONDANSETRON HCL 4 MG/2ML IJ SOLN: 4.0000 mg | Freq: Four times a day (QID) | INTRAMUSCULAR | Status: DC | PRN

## 2020-01-13 MED ORDER — MORPHINE SULFATE (PF) 2 MG/ML IV SOLN: 2.0000 mg | INTRAVENOUS | Status: DC | PRN

## 2020-01-13 MED ORDER — PROCHLORPERAZINE MALEATE 10 MG PO TABS
10.0000 mg | ORAL_TABLET | Freq: Four times a day (QID) | ORAL | Status: DC | PRN
  Filled 2020-01-13: qty 1

## 2020-01-13 SURGICAL SUPPLY — 60 items
APPLIER CLIP 11 MED OPEN (CLIP)
APPLIER CLIP 13 LRG OPEN (CLIP)
BAG BIOHAZARD 6X9 CLR ZIPLOCK (MISCELLANEOUS) ×3 IMPLANT
BLADE CLIPPER SURG (BLADE) ×3 IMPLANT
BLADE SURG 15 STRL LF DISP TIS (BLADE) ×1 IMPLANT
BLADE SURG 15 STRL SS (BLADE) ×2
BLADE SURG SZ11 CARB STEEL (BLADE) ×3 IMPLANT
CANISTER SUCT 1200ML W/VALVE (MISCELLANEOUS) ×3 IMPLANT
CATH URET ROBINSON 16FR STRL (CATHETERS) IMPLANT
CHLORAPREP W/TINT 26 (MISCELLANEOUS) ×3 IMPLANT
CLIP APPLIE 11 MED OPEN (CLIP) IMPLANT
CLIP APPLIE 13 LRG OPEN (CLIP) IMPLANT
COVER WAND RF STERILE (DRAPES) ×3 IMPLANT
DERMABOND ADVANCED (GAUZE/BANDAGES/DRESSINGS) ×2
DERMABOND ADVANCED .7 DNX12 (GAUZE/BANDAGES/DRESSINGS) ×1 IMPLANT
DRAPE LAPAROTOMY 100X77 ABD (DRAPES) ×3 IMPLANT
DRAPE LAPAROTOMY 77X122 PED (DRAPES) ×3 IMPLANT
DRSG OPSITE POSTOP 4X6 (GAUZE/BANDAGES/DRESSINGS) ×3 IMPLANT
DRSG TELFA 3X8 NADH (GAUZE/BANDAGES/DRESSINGS) ×3 IMPLANT
DRSG TELFA 4X3 1S NADH ST (GAUZE/BANDAGES/DRESSINGS) ×3 IMPLANT
ELECT BLADE 6.5 EXT (BLADE) ×3 IMPLANT
ELECT CAUTERY BLADE 6.4 (BLADE) ×3 IMPLANT
ELECT REM PT RETURN 9FT ADLT (ELECTROSURGICAL) ×3
ELECTRODE REM PT RTRN 9FT ADLT (ELECTROSURGICAL) ×1 IMPLANT
GAUZE SPONGE 4X4 12PLY STRL (GAUZE/BANDAGES/DRESSINGS) ×3 IMPLANT
GLOVE BIO SURGEON STRL SZ7 (GLOVE) ×6 IMPLANT
GOWN STRL REUS W/ TWL LRG LVL3 (GOWN DISPOSABLE) ×4 IMPLANT
GOWN STRL REUS W/TWL LRG LVL3 (GOWN DISPOSABLE) ×8
HANDLE SUCTION POOLE (INSTRUMENTS) ×1 IMPLANT
LIGASURE IMPACT 36 18CM CVD LR (INSTRUMENTS) IMPLANT
NDL HYPO 25X1 1.5 SAFETY (NEEDLE) ×1 IMPLANT
NEEDLE HYPO 22GX1.5 SAFETY (NEEDLE) ×3 IMPLANT
NEEDLE HYPO 25X1 1.5 SAFETY (NEEDLE) ×3 IMPLANT
NS IRRIG 1000ML POUR BTL (IV SOLUTION) ×3 IMPLANT
NS IRRIG 500ML POUR BTL (IV SOLUTION) ×5 IMPLANT
PACK BASIN MAJOR ARMC (MISCELLANEOUS) ×3 IMPLANT
PACK BASIN MINOR (MISCELLANEOUS) ×3 IMPLANT
PACK COLON CLEAN CLOSURE (MISCELLANEOUS) IMPLANT
PAD DRESSING TELFA 3X8 NADH (GAUZE/BANDAGES/DRESSINGS) ×1 IMPLANT
RELOAD PROXIMATE 75MM BLUE (ENDOMECHANICALS) ×6 IMPLANT
RELOAD STAPLE 75 3.8 BLU REG (ENDOMECHANICALS) IMPLANT
SOL PREP PVP 2OZ (MISCELLANEOUS)
SOLUTION PREP PVP 2OZ (MISCELLANEOUS) IMPLANT
SPONGE LAP 18X18 RF (DISPOSABLE) ×3 IMPLANT
STAPLER PROXIMATE 75MM BLUE (STAPLE) ×2 IMPLANT
STAPLER SKIN PROX 35W (STAPLE) ×3 IMPLANT
SUCTION POOLE HANDLE (INSTRUMENTS)
SURGILUBE 2OZ TUBE FLIPTOP (MISCELLANEOUS) IMPLANT
SUT MNCRL AB 4-0 PS2 18 (SUTURE) ×3 IMPLANT
SUT PDS PLUS 0 (SUTURE) ×8
SUT PDS PLUS AB 0 CT-2 (SUTURE) ×4 IMPLANT
SUT SILK 2 0 SH CR/8 (SUTURE) ×4 IMPLANT
SUT SILK 2 0SH CR/8 30 (SUTURE) ×3 IMPLANT
SUT VIC AB 2-0 SH 27 (SUTURE) ×2
SUT VIC AB 2-0 SH 27XBRD (SUTURE) ×1 IMPLANT
SUT VIC AB 3-0 SH 27 (SUTURE) ×2
SUT VIC AB 3-0 SH 27X BRD (SUTURE) ×1 IMPLANT
SYR 20ML LL LF (SYRINGE) ×6 IMPLANT
SYR TOOMEY 50ML (SYRINGE) IMPLANT
TRAY FOLEY MTR SLVR 16FR STAT (SET/KITS/TRAYS/PACK) IMPLANT

## 2020-01-13 NOTE — Interval H&P Note (Signed)
History and Physical Interval Note:  01/13/2020 3:37 PM  Keith Trujillo  has presented today for surgery, with the diagnosis of Colon cancer.  The various methods of treatment have been discussed with the patient and family. After consideration of risks, benefits and other options for treatment, the patient has consented to  Procedure(s): ILEOSTOMY TAKEDOWN (N/A) REMOVAL PORT-A-CATH (N/A) as a surgical intervention.  The patient's history has been reviewed, patient examined, no change in status, stable for surgery.  I have reviewed the patient's chart and labs.  Questions were answered to the patient's satisfaction.     [place]

## 2020-01-13 NOTE — Transfer of Care (Signed)
Immediate Anesthesia Transfer of Care Note  Patient: Keith Trujillo  Procedure(s) Performed: ILEOSTOMY TAKEDOWN (N/A ) REMOVAL PORT-A-CATH (N/A )  Patient Location: PACU  Anesthesia Type:General  Level of Consciousness: drowsy  Airway & Oxygen Therapy: Patient Spontanous Breathing and Patient connected to face mask oxygen  Post-op Assessment: Report given to RN and Post -op Vital signs reviewed and stable  Post vital signs: Reviewed and stable  Last Vitals:  Vitals Value Taken Time  BP 129/70 01/13/20 1914  Temp    Pulse 71 01/13/20 1920  Resp 0 01/13/20 1920  SpO2 100 % 01/13/20 1920  Vitals shown include unvalidated device data.  Last Pain:  Vitals:   01/13/20 1300  TempSrc: Oral  PainSc: 0-No pain         Complications: No complications documented.

## 2020-01-13 NOTE — Anesthesia Procedure Notes (Signed)
Procedure Name: Intubation Date/Time: 01/13/2020 4:18 PM Performed by: Eben Burow, CRNA Pre-anesthesia Checklist: Patient identified, Emergency Drugs available, Suction available and Patient being monitored Patient Re-evaluated:Patient Re-evaluated prior to induction Oxygen Delivery Method: Circle system utilized Preoxygenation: Pre-oxygenation with 100% oxygen Induction Type: IV induction Ventilation: Mask ventilation without difficulty Laryngoscope Size: Miller and 2 Grade View: Grade I Tube type: Oral Number of attempts: 1 Airway Equipment and Method: Stylet and LTA kit utilized Placement Confirmation: ETT inserted through vocal cords under direct vision,  positive ETCO2 and breath sounds checked- equal and bilateral Secured at: 22 cm Tube secured with: Tape Dental Injury: Teeth and Oropharynx as per pre-operative assessment

## 2020-01-13 NOTE — Anesthesia Preprocedure Evaluation (Signed)
Anesthesia Evaluation  Patient identified by MRN, date of birth, ID band Patient awake    Reviewed: Allergy & Precautions, NPO status , Patient's Chart, lab work & pertinent test results  History of Anesthesia Complications Negative for: history of anesthetic complications  Airway Mallampati: II  TM Distance: >3 FB Neck ROM: Full    Dental  (+) Poor Dentition   Pulmonary neg sleep apnea, neg COPD, Current Smoker (occasional cigar) and Patient abstained from smoking.,    breath sounds clear to auscultation- rhonchi (-) wheezing      Cardiovascular Exercise Tolerance: Good (-) hypertension(-) CAD, (-) Past MI, (-) Cardiac Stents and (-) CABG  Rhythm:Regular Rate:Normal - Systolic murmurs and - Diastolic murmurs    Neuro/Psych neg Seizures negative neurological ROS  negative psych ROS   GI/Hepatic negative GI ROS, Neg liver ROS,   Endo/Other  negative endocrine ROSneg diabetes  Renal/GU negative Renal ROS     Musculoskeletal negative musculoskeletal ROS (+)   Abdominal (+) - obese,   Peds  Hematology negative hematology ROS (+)   Anesthesia Other Findings Past Medical History: No date: Abdominal pain No date: Allergy No date: Cancer ([place]) No date: Cellulitis No date: Colon cancer ([facility]) No date: Dyspnea No date: Liver spot No date: Loose stools No date: Weight loss   Reproductive/Obstetrics                             Anesthesia Physical Anesthesia Plan  ASA: II  Anesthesia Plan: General   Post-op Pain Management:    Induction: Intravenous  PONV Risk Score and Plan: 0 and Ondansetron  Airway Management Planned: Oral ETT  Additional Equipment:   Intra-op Plan:   Post-operative Plan: Extubation in OR  Informed Consent: I have reviewed the patients History and Physical, chart, labs and discussed the procedure including the risks, benefits and alternatives for the proposed  anesthesia with the patient or authorized representative who has indicated his/her understanding and acceptance.     Dental advisory given  Plan Discussed with: CRNA and Anesthesiologist  Anesthesia Plan Comments:         Anesthesia Quick Evaluation

## 2020-01-13 NOTE — Op Note (Signed)
PROCEDURES: 1. Removal of port 2. 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 Genevive Bi required for exposure anastomosis and lack of first assistant  Anesthesia: General endotracheal anesthesia  ASA Class: 2  Surgeon: [name] , MD FACS  Anesthesia: Gen. with endotrachea  Findings: Widely patent anastomosis Appendix with significant adhesions to the ileum at risk of appendicitis  Estimated Blood Loss: 20cc              Specimens: appendix and small bowel       Complications: none                Condition: stable  Procedure Details  The patient was seen again in the Holding Room. The benefits, complications, treatment options, and expected outcomes were discussed with the patient. The risks of bleeding, infection, recurrence of symptoms, failure to resolve symptoms,  bowel injury, any of which could require further surgery were reviewed with the patient.   The patient was taken to Operating Room, identified as Keith Trujillo and the procedure verified.  A Time Out was held and the above information confirmed.  Prior to the induction of general anesthesia, antibiotic prophylaxis was administered. VTE prophylaxis was in place. General endotracheal anesthesia was then administered and tolerated well. After the induction, chest was prepped and draped in the usual sterile fashion 15 blade was used to create incision over the right chest for port blade.  Metzenbaum's were used to dissect through subcutaneous tissue and the 2 Prolene sutures were cut.  Well Valsalva and remove the catheter and apply pressure.  The wound was closed in a 2 layer fashion with 3-0 Vicryl and two 4-0 Monocryl Dermabond was applied Tension was turned to the abdomen, it was prepped with betadine after we closed the ileostomy with 2-0 silk sutures. We   draped in the sterile fashion. The patient was positioned in the supine position.  Optical incision was created incorporated the ileostomy.  Electrocautery was used to dissect through subcutaneous tissue until we identified the fascia.  The fascia was incised and the subcutaneous tissue was also divided.  Were able to exteriorize the small bowel and the loop ileostomy.  Were able to clean both edges of the small bowel and divided each end of the small bowel with a 75 stapler device.  Sent the specimen for pathology.  We noticed that we were close to the ileocecal valve about 15 cm and I did not want to risk shortening the bowel with as side-to-side anastomosis and also putting tension while performing the side-to-side anastomosis.  At this time I decided to perform a classic end to end handsewn anastomosis.  We placed our first posterior row of Lembert sutures with 2 0 silks followed by posterior row of running Vicryl and an anterior row of connell sutures using 3-0 Vicryl.  We completed an anterior row of Lembert sutures with 2 oh silks.  Anastomosis was widely patent without evidence of leaks.  We closed the mesenteric defect with a 3-0 Vicryl.  We also saw that the appendix was adhered to the mesentery of the mesentery that was at risk for potential appendicitis and with a significant adhesions we felt that it was in the patient best interest to proceed with appendectomy.  The appendix was located and the mesoappendix was ligated between clamps and sutures.  Using a GIA stapler  the appendix was divided in the standard fashion. The fascia was closed with 0 PDS suture using the small bite technique in the standard fashion.  The wound was irrigated.  We changed gloves close the subcutaneous tissue with 3-0 Vicryl and the skin with staples. Proximal Marcaine was applied throughout the abdominal wall  Needle and laparotomy count were correct and there were no immediate complications  [name], MD,  FACS

## 2020-01-14 ENCOUNTER — Encounter: Payer: Self-pay | Admitting: Surgery

## 2020-01-14 LAB — BASIC METABOLIC PANEL
Anion gap: 11 (ref 5–15)
BUN: 15 mg/dL (ref 6–20)
CO2: 21 mmol/L — ABNORMAL LOW (ref 22–32)
Calcium: 8.7 mg/dL — ABNORMAL LOW (ref 8.9–10.3)
Chloride: 103 mmol/L (ref 98–111)
Creatinine, Ser: 0.98 mg/dL (ref 0.61–1.24)
GFR, Estimated: >60 mL/min (ref >60)
Glucose, Bld: 117 mg/dL — ABNORMAL HIGH (ref 70–99)
Potassium: 3.9 mmol/L (ref 3.5–5.1)
Sodium: 135 mmol/L (ref 135–145)

## 2020-01-14 LAB — CBC
HCT: 36.8 % — ABNORMAL LOW (ref 39.0–52.0)
Hemoglobin: 12.1 g/dL — ABNORMAL LOW (ref 13.0–17.0)
MCH: 31.6 pg (ref 26.0–34.0)
MCHC: 32.9 g/dL (ref 30.0–36.0)
MCV: 96.1 fL (ref 80.0–100.0)
Platelets: 127 10*3/uL — ABNORMAL LOW (ref 150–400)
RBC: 3.83 MIL/uL — ABNORMAL LOW (ref 4.22–5.81)
RDW: 12.3 % (ref 11.5–15.5)
WBC: 10.3 10*3/uL (ref 4.0–10.5)
nRBC: 0 % (ref 0.0–0.2)

## 2020-01-14 LAB — MAGNESIUM: Magnesium: 1.9 mg/dL (ref 1.7–2.4)

## 2020-01-14 LAB — PHOSPHORUS: Phosphorus: 4.7 mg/dL — ABNORMAL HIGH (ref 2.5–4.6)

## 2020-01-14 NOTE — Progress Notes (Addendum)
[facility] Day(s): 1.   Post op day(s): 1 Day Post-Op.   Interval History:  Patient seen and examined no acute events or new complaints overnight.  Patient reports he is doing well No complaints of abdominal pain, nausea, emesis, fever, chills He is without leukocytosis on CBC this morning; WBC 10.3K Renal function appears normal, sCr - 0.98; UO - 550 ccs Mild hyperphosphatemia to 4.7 but otherwise no major electrolyte derangements He has been on CLD post-operatively; tolerating well Awaiting return of bowel function; denied flatus Mobilizing well in the hall   Vital signs in last 24 hours: [min-max] current  Temp:  [97.8 F (36.6 C)-98.5 F (36.9 C)] 98 F (36.7 C) (10/27 0600) Pulse Rate:  [65-95] 65 (10/27 0600) Resp:  [7-22] 16 (10/27 0600) BP: (124-159)/(65-83) 125/65 (10/27 0600) SpO2:  [97 %-100 %] 97 % (10/27 0600) Weight:  [85 kg] 85 kg (10/26 1300)     Height: 6\' 4"  (193 cm) Weight: 85 kg BMI (Calculated): 22.82   Intake/Output last 2 shifts:  10/26 0701 - 10/27 0700 In: 2829.3 [P.O.:1140; I.V.:1389.3; IV Piggyback:300] Out: 1020 [Urine:550; Blood:20]   Physical Exam:  Constitutional: alert, cooperative and no distress  Respiratory: breathing non-labored at rest  Cardiovascular: regular rate and sinus rhythm  Gastrointestinal: Soft, non-tender, non-distended, no rebound/guarding Integumentary: Previous port site in right upper chest is healing well, dermabond in place, no erythema or drainage. Previous ileostomy site is CDI with staples and honeycomb, no erythema or drainage   Labs:  CBC Latest Ref Rng & Units 01/14/2020 01/13/2020 12/26/2019  WBC 4.0 - 10.5 K/uL 10.3 9.1 3.3(L)  Hemoglobin 13.0 - 17.0 g/dL 12.1(L) 13.5 12.0(L)  Hematocrit 39 - 52 % 36.8(L) 40.3 35.1(L)  Platelets 150 - 400 K/uL 127(L) 132(L) 109(L)   CMP Latest Ref Rng & Units 01/14/2020 01/13/2020 12/26/2019  Glucose 70 - 99 mg/dL 117(H) -  105(H)  BUN 6 - 20 mg/dL 15 - 14  Creatinine 0.61 - 1.24 mg/dL 0.98 1.03 0.70  Sodium 135 - 145 mmol/L 135 - 138  Potassium 3.5 - 5.1 mmol/L 3.9 - 4.0  Chloride 98 - 111 mmol/L 103 - 107  CO2 22 - 32 mmol/L 21(L) - 24  Calcium 8.9 - 10.3 mg/dL 8.7(L) - 8.7(L)  Total Protein 6.5 - 8.1 g/dL - - 6.6  Total Bilirubin 0.3 - 1.2 mg/dL - - 0.8  Alkaline Phos 38 - 126 U/L - - 65  AST 15 - 41 U/L - - 25  ALT 0 - 44 U/L - - 29     Imaging studies: No new pertinent imaging studies   Assessment/Plan:  59 y.o. male overall doing well awaiting return of bowel function 1 Day Post-Op s/p port removal, ileostomy takedown, and appendectomy for history of rectosigmoid cancer s/p LAR and loop ileostomy   - Advance to full liquid diet; awaiting return of bowel function before advancing to regular diet  - Wean IVF resuscitation  - Monitor abdominal examination; on-going bowel function  - Pain control prn; antiemetics prn  - Monitor electrolytes; replete if needed  - Mobilization encouraged  - Home medications for comorbid conditions    - Discharge Planning; Awaiting return of bowel function   All of the above findings and recommendations were discussed with the patient, and the medical team, and all of patient's questions were answered to his expressed satisfaction.  -- Edison Simon, PA-C [facility] 01/14/2020, 7:35 AM [phone] M-F: 7am - 4pm

## 2020-01-15 LAB — SURGICAL PATHOLOGY

## 2020-01-15 MED ORDER — IBUPROFEN 800 MG PO TABS: 800.0000 mg | ORAL_TABLET | Freq: Three times a day (TID) | ORAL | 0 refills | Status: DC | PRN

## 2020-01-15 MED ORDER — OXYCODONE HCL 5 MG PO TABS
5.0000 mg | ORAL_TABLET | Freq: Four times a day (QID) | ORAL | 0 refills | Status: DC | PRN
Start: 2020-01-15 — End: 2020-01-21

## 2020-01-15 NOTE — Progress Notes (Signed)
Received discharge to home order.  Patient stable, passing gas, incisions well approximated, no signs/symptoms of infection, vitals stable.  Reviewed medications, discharge instructions, appointments with patient.  Denies questions/concerns.  Declined wheelchair, RN walked with patient to entrance, patient got into passenger seat of car and departed.

## 2020-01-15 NOTE — Discharge Instructions (Signed)
In addition to included general post-operative instructions for ileostomy reversal,  Diet: Resume home diet.   Activity: No heavy lifting >20 pounds (children, pets, laundry, garbage) for 6 weeks, but light activity and walking are encouraged. Do not drive or drink alcohol if taking narcotic pain medications or having pain that might distract from driving.  Wound care: You may shower/get incision wet with soapy water and pat dry (do not rub incisions), but no baths or submerging incision underwater until follow-up.   Medications: Resume all home medications. For mild to moderate pain: acetaminophen (Tylenol) or ibuprofen/naproxen (if no kidney disease). Combining Tylenol with alcohol can substantially increase your risk of causing liver disease. Narcotic pain medications, if prescribed, can be used for severe pain, though may cause nausea, constipation, and drowsiness. Do not combine Tylenol and Percocet (or similar) within a 6 hour period as Percocet (and similar) contain(s) Tylenol. If you do not need the narcotic pain medication, you do not need to fill the prescription.  Call office [phone] / [phone]) at any time if any questions, worsening pain, fevers/chills, bleeding, drainage from incision site, or other concerns.

## 2020-01-15 NOTE — Discharge Summary (Signed)
[facility] SURGICAL DISCHARGE SUMMARY  Patient ID: Joevanni Roddey MRN: [id number] DOB/AGE: 60-Jun-1962 60 y.o.  Admit date: 01/13/2020 Discharge date: 01/15/2020  Discharge Diagnoses Patient Active Problem List   Diagnosis Date Noted  . S/P colostomy takedown 01/13/2020    Consultants None  Procedures 01/13/2020:  1. Removal of port 2. Ileostomy takedown with end to end hand sewn anastomosis 3.Appendectomy  HPI: Keith Trujillo is a 59 y.o. 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   Hospital Course: Informed consent was obtained and documented, and patient underwent uneventful ileostomy takedown (Dr Dahlia Byes, 01/13/2020).  Post-operatively, patient did very well and advancement of patient's diet and ambulation were well-tolerated. The remainder of patient's hospital course was essentially unremarkable, and discharge planning was initiated accordingly with patient safely able to be discharged home with appropriate discharge instructions, pain control, and outpatient follow-up after all of his questions were answered to his expressed satisfaction.   Discharge Condition: Good   Physical Examination:  Constitutional: alert, cooperative and no distress  Respiratory: breathing non-labored at rest  Cardiovascular: regular rate and sinus rhythm  Gastrointestinal: Soft, non-tender, non-distended, no rebound/guarding Integumentary: Previous port site in right upper chest is healing well, dermabond in place, no erythema or drainage. Previous ileostomy site is CDI with staples and honeycomb, no erythema or drainage    Allergies as of 01/15/2020      Reactions   Sulfa Antibiotics Rash      Medication List    STOP taking these medications   bisacodyl 5 MG EC tablet Commonly known as: DULCOLAX   lidocaine-prilocaine cream Commonly  known as: EMLA   metroNIDAZOLE 500 MG tablet Commonly known as: FLAGYL   neomycin 500 MG tablet Commonly known as: MYCIFRADIN   polyethylene glycol powder 17 GM/SCOOP powder Commonly known as: MiraLax     TAKE these medications   ibuprofen 800 MG tablet Commonly known as: ADVIL Take 1 tablet (800 mg total) by mouth every 8 (eight) hours as needed.   IMODIUM MULTI-SYMPTOM RELIEF PO Take 1 tablet by mouth daily.   loratadine 10 MG tablet Commonly known as: CLARITIN Take 10 mg by mouth daily.   multivitamin tablet Take 1 tablet by mouth daily. Centrum   oxyCODONE 5 MG immediate release tablet Commonly known as: Oxy IR/ROXICODONE Take 1 tablet (5 mg total) by mouth every 6 (six) hours as needed for moderate pain, severe pain or breakthrough pain.         Follow-up Information    Pabon, [place] F, MD. Schedule an appointment as soon as possible for a visit in 1 week(s).   Specialty: General Surgery Why: s/p ileostomy takedown, staple removal Contact information: [address] [place] [address] [phone]                Time spent on discharge management including discussion of hospital course, clinical condition, outpatient instructions, prescriptions, and follow up with the patient and members of the medical team: >30 minutes  -- Edison Simon , PA-C [facility]  01/15/2020, 9:16 AM [phone] M-F: 7am - 4pm

## 2020-01-21 ENCOUNTER — Ambulatory Visit (INDEPENDENT_AMBULATORY_CARE_PROVIDER_SITE_OTHER): Payer: No Typology Code available for payment source | Admitting: Surgery

## 2020-01-21 ENCOUNTER — Encounter: Payer: Self-pay | Admitting: Surgery

## 2020-01-21 ENCOUNTER — Other Ambulatory Visit: Payer: Self-pay

## 2020-01-21 VITALS — BP 132/80 | HR 69 | Temp 98.3°F | Resp 12 | Wt 198.0 lb

## 2020-01-21 DIAGNOSIS — Z09 Encounter for follow-up examination after completed treatment for conditions other than malignant neoplasm: Secondary | ICD-10-CM

## 2020-01-21 NOTE — Patient Instructions (Signed)
You will come next week for a nurse visit to remove staples and place steri-strips. You will follow up with Dr Dahlia Byes as needed.  See your appointment below. Call the office if you have any questions or concerns.  GENERAL POST-OPERATIVE PATIENT INSTRUCTIONS   WOUND CARE INSTRUCTIONS:  Keep a dry clean dressing on the wound if there is drainage. The initial bandage may be removed after 24 hours.  Once the wound has quit draining you may leave it open to air.  If clothing rubs against the wound or causes irritation and the wound is not draining you may cover it with a dry dressing during the daytime.  Try to keep the wound dry and avoid ointments on the wound unless directed to do so.  If the wound becomes bright red and painful or starts to drain infected material that is not clear, please contact your physician immediately.  If the wound is mildly pink and has a thick firm ridge underneath it, this is normal, and is referred to as a healing ridge.  This will resolve over the next 4-6 weeks.  BATHING: You may shower if you have been informed of this by your surgeon. However, Please do not submerge in a tub, hot tub, or pool until incisions are completely sealed or have been told by your surgeon that you may do so.  DIET:  You may eat any foods that you can tolerate.  It is a good idea to eat a high fiber diet and take in plenty of fluids to prevent constipation.  If you do become constipated you may want to take a mild laxative or take ducolax tablets on a daily basis until your bowel habits are regular.  Constipation can be very uncomfortable, along with straining, after recent surgery.  ACTIVITY:  You are encouraged to cough and deep breath or use your incentive spirometer if you were given one, every 15-30 minutes when awake.  This will help prevent respiratory complications and low grade fevers post-operatively if you had a general anesthetic.  You may want to hug a pillow when coughing and sneezing  to add additional support to the surgical area, if you had abdominal or chest surgery, which will decrease pain during these times.  You are encouraged to walk and engage in light activity for the next two weeks.  You should not lift more than 10-15 pounds, 4-6 weeks after surgery as it could put you at increased risk for complications.  Twenty pounds is roughly equivalent to a plastic bag of groceries. At that time- Listen to your body when lifting, if you have pain when lifting, stop and then try again in a few days. Soreness after doing exercises or activities of daily living is normal as you get back in to your normal routine.  MEDICATIONS:  Try to take narcotic medications and anti-inflammatory medications, such as tylenol, ibuprofen, naprosyn, etc., with food.  This will minimize stomach upset from the medication.  Should you develop nausea and vomiting from the pain medication, or develop a rash, please discontinue the medication and contact your physician.  You should not drive, make important decisions, or operate machinery when taking narcotic pain medication.  SUNBLOCK Use sun block to incision area over the next year if this area will be exposed to sun. This helps decrease scarring and will allow you avoid a permanent darkened area over your incision.  QUESTIONS:  Please feel free to call our office if you have any questions, and we  will be glad to assist you.

## 2020-01-22 NOTE — Progress Notes (Signed)
S/p ileostomy reversla doing very well Taking PO, + flatus and BM No abd pain  PE NAD Abd: soft, staples in place, no infection or peritonitis  A/P Doing well RTC next week for RN visit for staple removal. No heavy lifting

## 2020-01-26 NOTE — Anesthesia Postprocedure Evaluation (Signed)
Anesthesia Post Note  Patient: Keith Trujillo  Procedure(s) Performed: ILEOSTOMY TAKEDOWN (N/A ) REMOVAL PORT-A-CATH (N/A )  Patient location during evaluation: PACU Anesthesia Type: General Level of consciousness: awake and alert Pain management: pain level controlled Vital Signs Assessment: post-procedure vital signs reviewed and stable Respiratory status: spontaneous breathing, nonlabored ventilation, respiratory function stable and patient connected to nasal cannula oxygen Cardiovascular status: blood pressure returned to baseline and stable Postop Assessment: no apparent nausea or vomiting Anesthetic complications: no   No complications documented.   Last Vitals:  Vitals:   01/15/20 1133 01/15/20 1512  BP: 126/75 (!) 145/86  Pulse: 64 72  Resp: 16 15  Temp: 36.8 C 37 C  SpO2: 100% 100%    Last Pain:  Vitals:   01/15/20 1512  TempSrc: Oral  PainSc:                  Molli Barrows

## 2020-01-28 ENCOUNTER — Other Ambulatory Visit: Payer: Self-pay

## 2020-01-28 ENCOUNTER — Ambulatory Visit (INDEPENDENT_AMBULATORY_CARE_PROVIDER_SITE_OTHER): Payer: No Typology Code available for payment source

## 2020-01-28 DIAGNOSIS — Z09 Encounter for follow-up examination after completed treatment for conditions other than malignant neoplasm: Secondary | ICD-10-CM

## 2020-01-28 NOTE — Progress Notes (Signed)
Patient here today for staple removal. Incision looks well healed. No drainage. Bowels moving good. Denies any issues at this time.

## 2020-04-30 ENCOUNTER — Inpatient Hospital Stay: Payer: No Typology Code available for payment source | Attending: Oncology

## 2020-04-30 ENCOUNTER — Encounter: Payer: Self-pay | Admitting: Oncology

## 2020-04-30 ENCOUNTER — Inpatient Hospital Stay (HOSPITAL_BASED_OUTPATIENT_CLINIC_OR_DEPARTMENT_OTHER): Payer: No Typology Code available for payment source | Admitting: Oncology

## 2020-04-30 ENCOUNTER — Other Ambulatory Visit: Payer: Self-pay

## 2020-04-30 VITALS — BP 142/72 | HR 57 | Temp 97.9°F | Resp 16 | Wt 199.3 lb

## 2020-04-30 DIAGNOSIS — Z85038 Personal history of other malignant neoplasm of large intestine: Secondary | ICD-10-CM

## 2020-04-30 DIAGNOSIS — C187 Malignant neoplasm of sigmoid colon: Secondary | ICD-10-CM

## 2020-04-30 DIAGNOSIS — C189 Malignant neoplasm of colon, unspecified: Secondary | ICD-10-CM | POA: Insufficient documentation

## 2020-04-30 DIAGNOSIS — R0602 Shortness of breath: Secondary | ICD-10-CM | POA: Diagnosis not present

## 2020-04-30 DIAGNOSIS — Z08 Encounter for follow-up examination after completed treatment for malignant neoplasm: Secondary | ICD-10-CM

## 2020-04-30 DIAGNOSIS — Z9221 Personal history of antineoplastic chemotherapy: Secondary | ICD-10-CM | POA: Insufficient documentation

## 2020-04-30 DIAGNOSIS — G620 Drug-induced polyneuropathy: Secondary | ICD-10-CM | POA: Insufficient documentation

## 2020-04-30 DIAGNOSIS — Z87891 Personal history of nicotine dependence: Secondary | ICD-10-CM | POA: Diagnosis not present

## 2020-04-30 DIAGNOSIS — R634 Abnormal weight loss: Secondary | ICD-10-CM | POA: Diagnosis not present

## 2020-04-30 LAB — CBC WITH DIFFERENTIAL/PLATELET
Abs Immature Granulocytes: 0.05 10*3/uL (ref 0.00–0.07)
Basophils Absolute: 0.1 10*3/uL (ref 0.0–0.1)
Basophils Relative: 1 %
Eosinophils Absolute: 0.4 10*3/uL (ref 0.0–0.5)
Eosinophils Relative: 6 %
HCT: 38.7 % — ABNORMAL LOW (ref 39.0–52.0)
Hemoglobin: 13 g/dL (ref 13.0–17.0)
Immature Granulocytes: 1 %
Lymphocytes Relative: 20 %
Lymphs Abs: 1.3 10*3/uL (ref 0.7–4.0)
MCH: 32.1 pg (ref 26.0–34.0)
MCHC: 33.6 g/dL (ref 30.0–36.0)
MCV: 95.6 fL (ref 80.0–100.0)
Monocytes Absolute: 0.4 10*3/uL (ref 0.1–1.0)
Monocytes Relative: 7 %
Neutro Abs: 4.2 10*3/uL (ref 1.7–7.7)
Neutrophils Relative %: 65 %
Platelets: 157 10*3/uL (ref 150–400)
RBC: 4.05 MIL/uL — ABNORMAL LOW (ref 4.22–5.81)
RDW: 12.5 % (ref 11.5–15.5)
WBC: 6.4 10*3/uL (ref 4.0–10.5)
nRBC: 0 % (ref 0.0–0.2)

## 2020-04-30 LAB — COMPREHENSIVE METABOLIC PANEL
ALT: 33 U/L (ref 0–44)
AST: 25 U/L (ref 15–41)
Albumin: 4 g/dL (ref 3.5–5.0)
Alkaline Phosphatase: 63 U/L (ref 38–126)
Anion gap: 9 (ref 5–15)
BUN: 10 mg/dL (ref 6–20)
CO2: 28 mmol/L (ref 22–32)
Calcium: 9 mg/dL (ref 8.9–10.3)
Chloride: 103 mmol/L (ref 98–111)
Creatinine, Ser: 0.73 mg/dL (ref 0.61–1.24)
GFR, Estimated: >60 mL/min (ref >60)
Glucose, Bld: 101 mg/dL — ABNORMAL HIGH (ref 70–99)
Potassium: 3.8 mmol/L (ref 3.5–5.1)
Sodium: 140 mmol/L (ref 135–145)
Total Bilirubin: 0.6 mg/dL (ref 0.3–1.2)
Total Protein: 6.6 g/dL (ref 6.5–8.1)

## 2020-04-30 NOTE — Progress Notes (Signed)
Hematology/Oncology Consult note [facility]  [phone] [phone]  Patient Care Team: Kendell Bane, NP as PCP - General (Adult Health Nurse Practitioner) Clent Jacks, RN as Oncology Nurse Navigator Sindy Guadeloupe, MD as Consulting Physician (Oncology)   Name of the patient: Keith Trujillo  [id number]  06/04/1960   Date of visit: 04/30/20  Diagnosis- stage IIIb colon adenocarcinoma  Chief complaint/ Reason for visit-routine follow-up of colon cancer currently in remission  Heme/Onc history:  patient is a 60 year old male who was seen by Dr. Remi Haggard GI for symptoms of diarrhea as well as abdominal pain.He underwent a colonoscopy on 01/02/2019 which showed an ulcerated partially obstructing large mass in the sigmoid colon which measured 10 cm in length biopsy showed adenocarcinoma moderately differentiated. Baseline CEA elevated at 71.7.   CT abdomen pelvis with contrast showed irregular marked circumferential wall thickening in the sigmoid colon near the rectosigmoid junction. The lesion longitudinally extends for approximately 5 to 6 cm. 4.1 x 2.8 cm ill-defined irregular paracolonic soft tissue mass may represent direct tumor extension or contiguous markedly enlarged lymph node. Abnormal lymph nodes also seen in the perirectal and presacral space concerning for metastatic disease. 1.5 cm ill-defined hypoattenuating lesion in the medial segment of the left liver which may be focal fatty deposition but cannot be definitively characterized.MRI did not show any evidence of liver metastases. CT chest was negative for metastatic disease.  Patient underwent a low anterior resection of the sigmoid mass. Final pathology showed 8 cm invasive colorectal adenocarcinoma grade 2. Tumor invades through the muscularis propria into pericolorectal tissue. All margins are uninvolvedLymphovascular invasion and perineural invasion present.  Lymph nodes 1+ out of 20 PT3PN1A. MSI stable  Patient completed 12 cycles of adjuvant FOLFOX chemotherapy on 09/16/2019   Interval history-patient reports doing well overall.  His port is now out.  Colostomy has been reversed.  Denies any problems with his bowel movements.  Appetite and weight have remained stable.  Has mild intermittent neuropathy in his fingers.  ECOG PS- 1 Pain scale- 0   Review of systems- Review of Systems  Constitutional: Negative for chills, fever, malaise/fatigue and weight loss.  HENT: Negative for congestion, ear discharge and nosebleeds.   Eyes: Negative for blurred vision.  Respiratory: Negative for cough, hemoptysis, sputum production, shortness of breath and wheezing.   Cardiovascular: Negative for chest pain, palpitations, orthopnea and claudication.  Gastrointestinal: Negative for abdominal pain, blood in stool, constipation, diarrhea, heartburn, melena, nausea and vomiting.  Genitourinary: Negative for dysuria, flank pain, frequency, hematuria and urgency.  Musculoskeletal: Negative for back pain, joint pain and myalgias.  Skin: Negative for rash.  Neurological: Positive for sensory change (Peripheral neuropathy). Negative for dizziness, tingling, focal weakness, seizures, weakness and headaches.  Endo/Heme/Allergies: Does not bruise/bleed easily.  Psychiatric/Behavioral: Negative for depression and suicidal ideas. The patient does not have insomnia.       Allergies  Allergen Reactions  . Sulfa Antibiotics Rash     Past Medical History:  Diagnosis Date  . Abdominal pain   . Allergy   . Cancer ([place])   . Cellulitis   . Colon cancer ([place])   . Dyspnea   . Liver spot   . Loose stools   . Weight loss      Past Surgical History:  Procedure Laterality Date  . COLONOSCOPY WITH PROPOFOL N/A 01/02/2019   Procedure: COLONOSCOPY WITH BIOPSY;  Surgeon: [name], MD;  Location: [place];  Service: Endoscopy;  Laterality: N/A;  Clip  x2 at Biopsy Site  . COLOSTOMY Right 02/06/2019   Procedure: COLOSTOMY;  Surgeon: [name], MD;  Location: [facility];  Service: General;  Laterality: Right;  . excision of skin lesion    . FLEXIBLE SIGMOIDOSCOPY N/A 10/23/2019   Procedure: FLEXIBLE SIGMOIDOSCOPY WITH BIOPSY and DILATION;  Surgeon: [name], MD;  Location: [place];  Service: Endoscopy;  Laterality: N/A;  needs port accessed  . ILEOSTOMY CLOSURE N/A 01/13/2020   Procedure: ILEOSTOMY TAKEDOWN;  Surgeon: [name], MD;  Location: [facility];  Service: General;  Laterality: N/A;  . LAPAROSCOPIC SIGMOID COLECTOMY N/A 02/06/2019   Procedure: LAPAROSCOPIC SIGMOID COLECTOMY converted to open procedure;  Surgeon: [name], MD;  Location: [facility];  Service: General;  Laterality: N/A;  . POLYPECTOMY N/A 10/23/2019   Procedure: POLYPECTOMY;  Surgeon: [name], MD;  Location: [place];  Service: Endoscopy;  Laterality: N/A;  . PORT-A-CATH REMOVAL N/A 01/13/2020   Procedure: REMOVAL PORT-A-CATH;  Surgeon: [name], MD;  Location: [facility];  Service: General;  Laterality: N/A;  . PORTACATH PLACEMENT Right 03/06/2019   Procedure: INSERTION PORT-A-CATH;  Surgeon: [name], MD;  Location: [facility];  Service: General;  Laterality: Right;  . TONSILLECTOMY AND ADENOIDECTOMY  1969    Social History   Socioeconomic History  . Marital status: Married    Spouse name: Not on file  . Number of children: Not on file  . Years of education: Not on file  . Highest education level: Not on file  Occupational History  . Not on file  Tobacco Use  . Smoking status: Light Tobacco Smoker    Types: Cigars  . Smokeless tobacco: Former Systems developer    Quit date: Comptroller  . Vaping Use: Never used  Substance and Sexual Activity  . Alcohol use: Not Currently    Comment: rare  . Drug use: Never  . Sexual activity: Yes  Other Topics Concern  . Not on file  Social History Narrative  . Not on file    Social Determinants of Health   Financial Resource Strain: Not on file  Food Insecurity: Not on file  Transportation Needs: Not on file  Physical Activity: Not on file  Stress: Not on file  Social Connections: Not on file  Intimate Partner Violence: Not on file    Family History  Problem Relation Age of Onset  . Lung cancer Father      Current Outpatient Medications:  .  loratadine (CLARITIN) 10 MG tablet, Take 10 mg by mouth daily. , Disp: , Rfl:  .  Multiple Vitamin (MULTIVITAMIN) tablet, Take 1 tablet by mouth daily. Centrum, Disp: , Rfl:  .  ibuprofen (ADVIL) 800 MG tablet, Take 1 tablet (800 mg total) by mouth every 8 (eight) hours as needed. (Patient not taking: Reported on 04/30/2020), Disp: 30 tablet, Rfl: 0 .  Loperamide-Simethicone (IMODIUM MULTI-SYMPTOM RELIEF PO), Take 1 tablet by mouth daily. (Patient not taking: Reported on 04/30/2020), Disp: , Rfl:  No current facility-administered medications for this visit.  Facility-Administered Medications Ordered in Other Visits:  .  sodium chloride flush (NS) 0.9 % injection 10 mL, 10 mL, Intravenous, PRN, Sindy Guadeloupe, MD, 10 mL at 09/02/19 0832  Physical exam:  Vitals:   04/30/20 1020  BP: (!) 142/72  Pulse: (!) 57  Resp: 16  Temp: 97.9 F (36.6 C)  TempSrc: Oral  Weight: 199 lb 4.8 oz (90.4 kg)  Physical Exam Constitutional:      General: He is not in acute distress. Eyes:     Extraocular Movements: EOM normal.  Cardiovascular:     Rate and Rhythm: Normal rate and regular rhythm.     Heart sounds: Normal heart sounds.  Pulmonary:     Effort: Pulmonary effort is normal.     Breath sounds: Normal breath sounds.  Abdominal:     General: Bowel sounds are normal.     Palpations: Abdomen is soft.     Comments: Surgical scar in the midline as well as anterolateral wound has healed well.  Skin:    General: Skin is warm and dry.  Neurological:     Mental Status: He is alert and oriented to person, place, and  time.      CMP Latest Ref Rng & Units 04/30/2020  Glucose 70 - 99 mg/dL 101(H)  BUN 6 - 20 mg/dL 10  Creatinine 0.61 - 1.24 mg/dL 0.73  Sodium 135 - 145 mmol/L 140  Potassium 3.5 - 5.1 mmol/L 3.8  Chloride 98 - 111 mmol/L 103  CO2 22 - 32 mmol/L 28  Calcium 8.9 - 10.3 mg/dL 9.0  Total Protein 6.5 - 8.1 g/dL 6.6  Total Bilirubin 0.3 - 1.2 mg/dL 0.6  Alkaline Phos 38 - 126 U/L 63  AST 15 - 41 U/L 25  ALT 0 - 44 U/L 33   CBC Latest Ref Rng & Units 04/30/2020  WBC 4.0 - 10.5 K/uL 6.4  Hemoglobin 13.0 - 17.0 g/dL 13.0  Hematocrit 39.0 - 52.0 % 38.7(L)  Platelets 150 - 400 K/uL 157      Assessment and plan- Patient is a 60 y.o. male with sigmoid adenocarcinoma stage IIIB pT3PN1A s/p low anterior resection.Patient is s/p 12 cycles of adjuvant FOLFOX chemotherapy and this is a routine surveillance visit  Clinically patient is doing well with no concerning signs and symptoms of recurrence based on today's exam.  He has now completed his colostomy reversal as well as port has been taken out.  He will be due for surveillance imaging in July 2022 which I will order and I will see him following that with a CBC with differential CMP and CEA.  I will also touch base with Dr. Allen Norris since the patient needs a repeat surveillance colonoscopy a year from his surgery which he has not had yet.  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  Continue to monitor   Visit Diagnosis 1. Encounter for follow-up surveillance of colon cancer      Dr. Randa Evens, MD, MPH [facility] [phone] 04/30/2020 11:29 AM

## 2020-05-01 LAB — CEA: CEA: 1.6 ng/mL (ref 0.0–4.7)

## 2020-09-24 ENCOUNTER — Other Ambulatory Visit: Payer: Self-pay

## 2020-09-24 ENCOUNTER — Ambulatory Visit
Admission: RE | Admit: 2020-09-24 | Discharge: 2020-09-24 | Disposition: A | Discharge status: Home / Self Care | Payer: 59 | Source: Ambulatory Visit | Attending: Oncology | Admitting: Oncology

## 2020-09-24 DIAGNOSIS — Z08 Encounter for follow-up examination after completed treatment for malignant neoplasm: Secondary | ICD-10-CM | POA: Diagnosis not present

## 2020-09-24 DIAGNOSIS — Z85038 Personal history of other malignant neoplasm of large intestine: Secondary | ICD-10-CM | POA: Insufficient documentation

## 2020-09-24 LAB — POCT I-STAT CREATININE: Creatinine, Ser: 0.9 mg/dL (ref 0.61–1.24)

## 2020-09-24 MED ORDER — IOHEXOL 300 MG/ML  SOLN
100.0000 mL | Freq: Once | INTRAMUSCULAR | Status: AC | PRN
  Administered 2020-09-24: 100 mL via INTRAVENOUS

## 2020-10-04 ENCOUNTER — Inpatient Hospital Stay (HOSPITAL_BASED_OUTPATIENT_CLINIC_OR_DEPARTMENT_OTHER): Payer: 59 | Admitting: Oncology

## 2020-10-04 ENCOUNTER — Encounter: Payer: Self-pay | Admitting: Oncology

## 2020-10-04 ENCOUNTER — Inpatient Hospital Stay: Payer: 59 | Attending: Oncology

## 2020-10-04 ENCOUNTER — Other Ambulatory Visit: Payer: Self-pay

## 2020-10-04 VITALS — BP 135/73 | HR 64 | Temp 97.5°F | Resp 16 | Wt 181.3 lb

## 2020-10-04 DIAGNOSIS — R197 Diarrhea, unspecified: Secondary | ICD-10-CM | POA: Diagnosis not present

## 2020-10-04 DIAGNOSIS — R109 Unspecified abdominal pain: Secondary | ICD-10-CM | POA: Diagnosis not present

## 2020-10-04 DIAGNOSIS — Z85038 Personal history of other malignant neoplasm of large intestine: Secondary | ICD-10-CM | POA: Diagnosis not present

## 2020-10-04 DIAGNOSIS — Z79899 Other long term (current) drug therapy: Secondary | ICD-10-CM | POA: Diagnosis not present

## 2020-10-04 DIAGNOSIS — Z933 Colostomy status: Secondary | ICD-10-CM | POA: Insufficient documentation

## 2020-10-04 DIAGNOSIS — Z08 Encounter for follow-up examination after completed treatment for malignant neoplasm: Secondary | ICD-10-CM | POA: Diagnosis not present

## 2020-10-04 DIAGNOSIS — R634 Abnormal weight loss: Secondary | ICD-10-CM | POA: Diagnosis not present

## 2020-10-04 DIAGNOSIS — R918 Other nonspecific abnormal finding of lung field: Secondary | ICD-10-CM | POA: Insufficient documentation

## 2020-10-04 DIAGNOSIS — F17210 Nicotine dependence, cigarettes, uncomplicated: Secondary | ICD-10-CM | POA: Insufficient documentation

## 2020-10-04 DIAGNOSIS — C187 Malignant neoplasm of sigmoid colon: Secondary | ICD-10-CM | POA: Diagnosis not present

## 2020-10-04 LAB — CBC WITH DIFFERENTIAL/PLATELET
Abs Immature Granulocytes: 0.01 10*3/uL (ref 0.00–0.07)
Basophils Absolute: 0.1 10*3/uL (ref 0.0–0.1)
Basophils Relative: 1 %
Eosinophils Absolute: 0.3 10*3/uL (ref 0.0–0.5)
Eosinophils Relative: 6 %
HCT: 37.5 % — ABNORMAL LOW (ref 39.0–52.0)
Hemoglobin: 12.4 g/dL — ABNORMAL LOW (ref 13.0–17.0)
Immature Granulocytes: 0 %
Lymphocytes Relative: 24 %
Lymphs Abs: 1.2 10*3/uL (ref 0.7–4.0)
MCH: 32.5 pg (ref 26.0–34.0)
MCHC: 33.1 g/dL (ref 30.0–36.0)
MCV: 98.4 fL (ref 80.0–100.0)
Monocytes Absolute: 0.3 10*3/uL (ref 0.1–1.0)
Monocytes Relative: 7 %
Neutro Abs: 3.1 10*3/uL (ref 1.7–7.7)
Neutrophils Relative %: 62 %
Platelets: 133 10*3/uL — ABNORMAL LOW (ref 150–400)
RBC: 3.81 MIL/uL — ABNORMAL LOW (ref 4.22–5.81)
RDW: 13.7 % (ref 11.5–15.5)
WBC: 4.9 10*3/uL (ref 4.0–10.5)
nRBC: 0 % (ref 0.0–0.2)

## 2020-10-04 LAB — COMPREHENSIVE METABOLIC PANEL
ALT: 47 U/L — ABNORMAL HIGH (ref 0–44)
AST: 28 U/L (ref 15–41)
Albumin: 4.6 g/dL (ref 3.5–5.0)
Alkaline Phosphatase: 39 U/L (ref 38–126)
Anion gap: 8 (ref 5–15)
BUN: 19 mg/dL (ref 6–20)
CO2: 24 mmol/L (ref 22–32)
Calcium: 9.6 mg/dL (ref 8.9–10.3)
Chloride: 105 mmol/L (ref 98–111)
Creatinine, Ser: 0.95 mg/dL (ref 0.61–1.24)
GFR, Estimated: >60 mL/min (ref >60)
Glucose, Bld: 99 mg/dL (ref 70–99)
Potassium: 3.9 mmol/L (ref 3.5–5.1)
Sodium: 137 mmol/L (ref 135–145)
Total Bilirubin: 0.6 mg/dL (ref 0.3–1.2)
Total Protein: 6.9 g/dL (ref 6.5–8.1)

## 2020-10-04 NOTE — Progress Notes (Signed)
In March or  April after he had got his colostomy closure he started looking into diet so that diarrhea would not bother him; he read about a carnivore diet- he eats any meat, egg, butter, cheese his stomach has been much better now.

## 2020-10-04 NOTE — Progress Notes (Signed)
Survivorship Care Plan visit completed.  Treatment summary reviewed and given to patient.  ASCO answers booklet reviewed and given to patient.  CARE program and Cancer Transitions discussed with patient along with other resources cancer center offers to patients and caregivers.  Patient verbalized understanding.    

## 2020-10-05 ENCOUNTER — Telehealth: Payer: Self-pay

## 2020-10-05 LAB — CEA: CEA: 1.7 ng/mL (ref 0.0–4.7)

## 2020-10-05 NOTE — Telephone Encounter (Signed)
LVM for pt to return my call to schedule a repeat colonoscopy.

## 2020-10-06 ENCOUNTER — Other Ambulatory Visit: Payer: Self-pay

## 2020-10-06 DIAGNOSIS — Z85038 Personal history of other malignant neoplasm of large intestine: Secondary | ICD-10-CM

## 2020-10-06 MED ORDER — NA SULFATE-K SULFATE-MG SULF 17.5-3.13-1.6 GM/177ML PO SOLN: 1.0000 | ORAL | 0 refills | Status: DC

## 2020-10-06 NOTE — Telephone Encounter (Signed)
Pt returned my call and was scheduled at [facility] on 10/29/20 for a colonoscopy. Instructions have been sent to pt through [facility]. Prescription sent to pharmacy.

## 2020-10-10 ENCOUNTER — Encounter: Payer: Self-pay | Admitting: Oncology

## 2020-10-10 NOTE — Progress Notes (Signed)
Hematology/Oncology Consult note [facility]  Telephone:([phone] [phone]  Patient Care Team: Kendell Bane, NP as PCP - General (Adult Health Nurse Practitioner) Clent Jacks, RN as Oncology Nurse Navigator [place], Marjory Lies, MD as Consulting Physician (General Surgery) Sindy Guadeloupe, MD as Consulting Physician (Oncology)   Name of the patient: Keith Trujillo  [id number]  10/31/1960   Date of visit: 10/10/20  Diagnosis- stage IIIb colon adenocarcinoma    Chief complaint/ Reason for visit-routine follow-up visit of colon cancer  Heme/Onc history: patient is a 60 year old male who was seen by Dr. Allen Norris from GI for symptoms of diarrhea as well as abdominal pain.He underwent a colonoscopy on 01/02/2019 which showed an ulcerated partially obstructing large mass in the sigmoid colon which measured 10 cm in length biopsy showed adenocarcinoma moderately differentiated.  Baseline CEA elevated at 71.7.   CT abdomen pelvis with contrast showed irregular marked circumferential wall thickening in the sigmoid colon near the rectosigmoid junction.  The lesion longitudinally extends for approximately 5 to 6 cm.  4.1 x 2.8 cm ill-defined irregular paracolonic soft tissue mass may represent direct tumor extension or contiguous markedly enlarged lymph node.  Abnormal lymph nodes also seen in the perirectal and presacral space concerning for metastatic disease.  1.5 cm ill-defined hypoattenuating lesion in the medial segment of the left liver which may be focal fatty deposition but cannot be definitively characterized.  MRI did not show any evidence of liver metastases.  CT chest was negative for metastatic disease.   Patient underwent a low anterior resection of the sigmoid mass.  Final pathology showed 8 cm invasive colorectal adenocarcinoma grade 2.  Tumor invades through the muscularis propria into pericolorectal tissue.  All margins are  uninvolvedLymphovascular invasion and perineural invasion present.  Lymph nodes 1+ out of 20 PT3PN1A.  MSI stable   Patient completed 12 cycles of adjuvant FOLFOX chemotherapy on 09/16/2019  Interval history-overall patient is doing well and denies any specific complaints at this time.He still has some issues with regular bowel movements sometimes diarrhea after the colostomy was taken down but he is trying to adjust his diet to regulate his bowel movements.  ECOG PS- 1 Pain scale- 0   Review of systems- Review of Systems  Constitutional:  Negative for chills, fever, malaise/fatigue and weight loss.  HENT:  Negative for congestion, ear discharge and nosebleeds.   Eyes:  Negative for blurred vision.  Respiratory:  Negative for cough, hemoptysis, sputum production, shortness of breath and wheezing.   Cardiovascular:  Negative for chest pain, palpitations, orthopnea and claudication.  Gastrointestinal:  Negative for abdominal pain, blood in stool, constipation, diarrhea, heartburn, melena, nausea and vomiting.  Genitourinary:  Negative for dysuria, flank pain, frequency, hematuria and urgency.  Musculoskeletal:  Negative for back pain, joint pain and myalgias.  Skin:  Negative for rash.  Neurological:  Negative for dizziness, tingling, focal weakness, seizures, weakness and headaches.  Endo/Heme/Allergies:  Does not bruise/bleed easily.  Psychiatric/Behavioral:  Negative for depression and suicidal ideas. The patient does not have insomnia.     Allergies  Allergen Reactions   Sulfa Antibiotics Rash     Past Medical History:  Diagnosis Date   Abdominal pain    Allergy    Cancer ([place])    Cellulitis    Colon cancer ([facility])    Dyspnea    Liver spot    Loose stools    Weight loss      Past Surgical History:  Procedure Laterality Date   COLONOSCOPY WITH PROPOFOL N/A 01/02/2019   Procedure: COLONOSCOPY WITH BIOPSY;  Surgeon: [name], MD;  Location: [place];   Service: Endoscopy;  Laterality: N/A;  Clip x2 at Biopsy Site   COLOSTOMY Right 02/06/2019   Procedure: COLOSTOMY;  Surgeon: [name], MD;  Location: [facility];  Service: General;  Laterality: Right;   excision of skin lesion     FLEXIBLE SIGMOIDOSCOPY N/A 10/23/2019   Procedure: FLEXIBLE SIGMOIDOSCOPY WITH BIOPSY and DILATION;  Surgeon: [name], MD;  Location: [place];  Service: Endoscopy;  Laterality: N/A;  needs port accessed   ILEOSTOMY CLOSURE N/A 01/13/2020   Procedure: ILEOSTOMY TAKEDOWN;  Surgeon: [name], MD;  Location: [facility];  Service: General;  Laterality: N/A;   LAPAROSCOPIC SIGMOID COLECTOMY N/A 02/06/2019   Procedure: LAPAROSCOPIC SIGMOID COLECTOMY converted to open procedure;  Surgeon: [name], MD;  Location: [facility];  Service: General;  Laterality: N/A;   POLYPECTOMY N/A 10/23/2019   Procedure: POLYPECTOMY;  Surgeon: [name], MD;  Location: [place];  Service: Endoscopy;  Laterality: N/A;   PORT-A-CATH REMOVAL N/A 01/13/2020   Procedure: REMOVAL PORT-A-CATH;  Surgeon: [name], MD;  Location: [facility];  Service: General;  Laterality: N/A;   PORTACATH PLACEMENT Right 03/06/2019   Procedure: INSERTION PORT-A-CATH;  Surgeon: [name], MD;  Location: [facility];  Service: General;  Laterality: Right;   TONSILLECTOMY AND ADENOIDECTOMY  1969    Social History   Socioeconomic History   Marital status: Married    Spouse name: Not on file   Number of children: Not on file   Years of education: Not on file   Highest education level: Not on file  Occupational History   Not on file  Tobacco Use   Smoking status: Light Smoker    Types: Cigars   Smokeless tobacco: Former    Quit date: 1980  Scientific laboratory technician Use: Never used  Substance and Sexual Activity   Alcohol use: Not Currently    Comment: rare   Drug use: Never   Sexual activity: Yes  Other Topics Concern   Not on file  Social History Narrative   Not  on file   Social Determinants of Health   Financial Resource Strain: Not on file  Food Insecurity: Not on file  Transportation Needs: Not on file  Physical Activity: Not on file  Stress: Not on file  Social Connections: Not on file  Intimate Partner Violence: Not on file    Family History  Problem Relation Age of Onset   Lung cancer Father      Current Outpatient Medications:    loratadine (CLARITIN) 10 MG tablet, Take 10 mg by mouth daily. , Disp: , Rfl:    Multiple Vitamin (MULTIVITAMIN) tablet, Take 1 tablet by mouth daily. Centrum, Disp: , Rfl:    Na Sulfate-K Sulfate-Mg Sulf (SUPREP BOWEL PREP KIT) 17.5-3.13-1.6 GM/177ML SOLN, Take 1 kit by mouth as directed., Disp: 354 mL, Rfl: 0 No current facility-administered medications for this visit.  Facility-Administered Medications Ordered in Other Visits:    sodium chloride flush (NS) 0.9 % injection 10 mL, 10 mL, Intravenous, PRN, Sindy Guadeloupe, MD, 10 mL at 09/02/19 0832  Physical exam:  Vitals:   10/04/20 1338  BP: 135/73  Pulse: 64  Resp: 16  Temp: (!) 97.5 F (36.4 C)  TempSrc: Oral  Weight: 181 lb 4.8 oz (82.2 kg)   Physical Exam  Cardiovascular:     Rate and Rhythm: Normal rate and regular rhythm.     Heart sounds: Normal heart sounds.  Pulmonary:     Effort: Pulmonary effort is normal.     Breath sounds: Normal breath sounds.  Abdominal:     General: Bowel sounds are normal.     Palpations: Abdomen is soft.     Comments: Colostomy takedown scar is seen  Skin:    General: Skin is warm and dry.  Neurological:     Mental Status: He is alert and oriented to person, place, and time.     CMP Latest Ref Rng & Units 10/04/2020  Glucose 70 - 99 mg/dL 99  BUN 6 - 20 mg/dL 19  Creatinine 0.61 - 1.24 mg/dL 0.95  Sodium 135 - 145 mmol/L 137  Potassium 3.5 - 5.1 mmol/L 3.9  Chloride 98 - 111 mmol/L 105  CO2 22 - 32 mmol/L 24  Calcium 8.9 - 10.3 mg/dL 9.6  Total Protein 6.5 - 8.1 g/dL 6.9  Total Bilirubin 0.3  - 1.2 mg/dL 0.6  Alkaline Phos 38 - 126 U/L 39  AST 15 - 41 U/L 28  ALT 0 - 44 U/L 47(H)   CBC Latest Ref Rng & Units 10/04/2020  WBC 4.0 - 10.5 K/uL 4.9  Hemoglobin 13.0 - 17.0 g/dL 12.4(L)  Hematocrit 39.0 - 52.0 % 37.5(L)  Platelets 150 - 400 K/uL 133(L)    No images are attached to the encounter.  CT CHEST ABDOMEN PELVIS W CONTRAST  Result Date: 09/25/2020 CLINICAL DATA:  History of colorectal cancer, assess treatment response. Follow-up stage III colon cancer diagnosed November 2020 post partial colectomy EXAM: CT CHEST, ABDOMEN, AND PELVIS WITH CONTRAST TECHNIQUE: Multidetector CT imaging of the chest, abdomen and pelvis was performed following the standard protocol during bolus administration of intravenous contrast. CONTRAST:  168m OMNIPAQUE IOHEXOL 300 MG/ML  SOLN COMPARISON:  09/25/2019 and 02/18/2019, 01/21/2019 FINDINGS: CT CHEST FINDINGS Cardiovascular: Heart is normal. Thoracic aorta is normal. Visualized pulmonary arterial system is unremarkable. Remaining vascular structures are within normal. Mediastinum/Nodes: No mediastinal or hilar adenopathy. No axillary adenopathy. Remaining mediastinal structures are normal. Lungs/Pleura: Lungs are adequately inflated without focal airspace consolidation or effusion. Calcified granuloma over the left lower lobe unchanged. 2-3 mm nodule over the lateral left lower lobe unchanged. No new nodules. Airways are normal. Musculoskeletal: Degenerative change of the spine. CT ABDOMEN PELVIS FINDINGS Hepatobiliary: Subtle peripheral linear hypodensity over the medial segment of the left lobe of the liver unchanged from 2020. No suspicious liver lesions. Gallbladder and biliary tree are normal. Pancreas: Normal. Spleen: Normal. Adrenals/Urinary Tract: Adrenal glands are normal. Kidneys are normal in size without hydronephrosis or nephrolithiasis. No focal renal mass. Ureters and bladder are normal. Stomach/Bowel: Stomach and small bowel are normal.  Appendix not well visualized. Moderate contrast, stool and air throughout the colon. Surgical suture line over the rectosigmoid junction. Mild fluid adjacent the rectosigmoid junction just proximal to the suture line extending to the presacral space slightly more pronounced compared to the previous exam. No focal mass or adenopathy. Findings likely due to post treatment changes. Vascular/Lymphatic: Abdominal aorta is normal in caliber. No adenopathy. Reproductive: Normal. Other: Postsurgical change over the midline anterior abdominal wall. Musculoskeletal: Mild degenerate change of the spine and hips. No focal lytic or sclerotic lesions. Bone island over the right femoral head unchanged. IMPRESSION: 1. Evidence of patient's previous partial colectomy for colon cancer. Mild fluid adjacent the rectosigmoid junction just proximal to the suture line  extending to the presacral space slightly more pronounced compared to the previous exam and likely representing post treatment changes. No focal mass or adenopathy. No evidence of metastatic disease within the chest, abdomen or pelvis. 2. Stable 2-3 mm nodule over the left lower lobe likely benign. Recommend attention on follow-up. 3. Subtle peripheral linear hypodensity over the medial segment of the left lobe of the liver unchanged from 2020 likely focal fatty change. Recommend attention on follow-up. Electronically Signed   By: Marin Olp M.D.   On: 09/25/2020 08:40     Assessment and plan- Patient is a 60 y.o. 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  He s/p 12 cycles of adjuvant FOLFOX chemotherapy and this is a routine follow-up visit  Clinically patient is doing well with no concerning signs and symptoms of recurrence based on today's exam.  CT chest abdomen and pelvis with contrast on 09/24/2020 did not reveal any evidence of recurrent or metastatic disease.  Stable postoperative changes.He does have a hypodensity in the  medial segment of the left lobe of the liver which has been evaluated in the past as well and was consistent with intralesional lipid.  CBC CMP CEA are all unremarkable.  I will see him back in 4 months with CBC with differential CMP and CEA.  Patient requires a surveillance colonoscopy a year from his surgery and is overdue for that and I have contacted GI again to make this happen.   Visit Diagnosis 1. Encounter for follow-up surveillance of colon cancer      Dr. Randa Evens, MD, MPH [facility] [phone] 10/10/2020 6:09 PM

## 2020-10-18 ENCOUNTER — Encounter: Payer: Self-pay | Admitting: Gastroenterology

## 2020-10-27 ENCOUNTER — Other Ambulatory Visit: Payer: Self-pay

## 2020-10-27 MED ORDER — NA SULFATE-K SULFATE-MG SULF 17.5-3.13-1.6 GM/177ML PO SOLN
1.0000 | ORAL | 0 refills | Status: DC
Start: 2020-10-27 — End: 2021-02-16

## 2020-10-29 ENCOUNTER — Other Ambulatory Visit: Payer: Self-pay

## 2020-10-29 ENCOUNTER — Ambulatory Visit: Payer: 59 | Admitting: Anesthesiology

## 2020-10-29 ENCOUNTER — Encounter
Admission: RE | Disposition: A | Discharge status: Home / Self Care | Payer: Self-pay | Source: Home / Self Care | Attending: Gastroenterology

## 2020-10-29 ENCOUNTER — Encounter: Payer: Self-pay | Admitting: Gastroenterology

## 2020-10-29 ENCOUNTER — Ambulatory Visit
Admission: RE | Admit: 2020-10-29 | Discharge: 2020-10-29 | Disposition: A | Discharge status: Home / Self Care | Payer: 59 | Attending: Gastroenterology | Admitting: Gastroenterology

## 2020-10-29 DIAGNOSIS — Z1211 Encounter for screening for malignant neoplasm of colon: Secondary | ICD-10-CM | POA: Insufficient documentation

## 2020-10-29 DIAGNOSIS — Z801 Family history of malignant neoplasm of trachea, bronchus and lung: Secondary | ICD-10-CM | POA: Insufficient documentation

## 2020-10-29 DIAGNOSIS — Z85038 Personal history of other malignant neoplasm of large intestine: Secondary | ICD-10-CM | POA: Diagnosis not present

## 2020-10-29 DIAGNOSIS — K56600 Partial intestinal obstruction, unspecified as to cause: Secondary | ICD-10-CM | POA: Diagnosis not present

## 2020-10-29 DIAGNOSIS — Z79899 Other long term (current) drug therapy: Secondary | ICD-10-CM | POA: Diagnosis not present

## 2020-10-29 DIAGNOSIS — Z86010 Personal history of colonic polyps: Secondary | ICD-10-CM | POA: Diagnosis not present

## 2020-10-29 DIAGNOSIS — Z882 Allergy status to sulfonamides status: Secondary | ICD-10-CM | POA: Diagnosis not present

## 2020-10-29 DIAGNOSIS — F17290 Nicotine dependence, other tobacco product, uncomplicated: Secondary | ICD-10-CM | POA: Diagnosis not present

## 2020-10-29 HISTORY — PX: COLONOSCOPY WITH PROPOFOL: SHX5780

## 2020-10-29 SURGERY — COLONOSCOPY WITH PROPOFOL
Anesthesia: General

## 2020-10-29 MED ORDER — PROPOFOL 10 MG/ML IV BOLUS
INTRAVENOUS | Status: DC | PRN
  Administered 2020-10-29: 20 mg via INTRAVENOUS
  Administered 2020-10-29: 30 mg via INTRAVENOUS
  Administered 2020-10-29: 150 mg via INTRAVENOUS
  Administered 2020-10-29: 50 mg via INTRAVENOUS

## 2020-10-29 MED ORDER — LIDOCAINE HCL (CARDIAC) PF 100 MG/5ML IV SOSY
PREFILLED_SYRINGE | INTRAVENOUS | Status: DC | PRN
  Administered 2020-10-29: 30 mg via INTRAVENOUS

## 2020-10-29 MED ORDER — STERILE WATER FOR IRRIGATION IR SOLN
Status: DC | PRN
  Administered 2020-10-29: 150 mL

## 2020-10-29 MED ORDER — LACTATED RINGERS IV SOLN: INTRAVENOUS | Status: DC

## 2020-10-29 MED ORDER — ACETAMINOPHEN 325 MG PO TABS: 325.0000 mg | ORAL_TABLET | ORAL | Status: DC | PRN

## 2020-10-29 MED ORDER — ACETAMINOPHEN 160 MG/5ML PO SOLN: 325.0000 mg | ORAL | Status: DC | PRN

## 2020-10-29 MED ORDER — ONDANSETRON HCL 4 MG/2ML IJ SOLN: 4.0000 mg | Freq: Once | INTRAMUSCULAR | Status: DC | PRN

## 2020-10-29 SURGICAL SUPPLY — 22 items

## 2020-10-29 NOTE — Anesthesia Postprocedure Evaluation (Signed)
Anesthesia Post Note  Patient: Keith Trujillo  Procedure(s) Performed: COLONOSCOPY WITH PROPOFOL     Patient location during evaluation: PACU Anesthesia Type: General Level of consciousness: awake and alert Pain management: pain level controlled Vital Signs Assessment: post-procedure vital signs reviewed and stable Respiratory status: spontaneous breathing, nonlabored ventilation, respiratory function stable and patient connected to nasal cannula oxygen Cardiovascular status: blood pressure returned to baseline and stable Postop Assessment: no apparent nausea or vomiting Anesthetic complications: no   No notable events documented.  Sinda Du

## 2020-10-29 NOTE — Op Note (Signed)
[facility] Gastroenterology Patient Name: Keith Trujillo Procedure Date: 10/29/2020 7:19 AM MRN: [id number] Account #: [id number] Date of Birth: 1961-03-14 Admit Type: Outpatient Age: 60 Room: [facility] OR ROOM 01 Gender: Male Note Status: Finalized Procedure:             Colonoscopy Indications:           High risk colon cancer surveillance: Personal history                         of colonic polyps, High risk colon cancer                         surveillance: Personal history of colon cancer Providers:             [name] MD, MD Referring MD:          Orson Gear NP Medicines:             Propofol per Anesthesia Complications:         No immediate complications. Procedure:             Pre-Anesthesia Assessment:                        - Prior to the procedure, a History and Physical was                         performed, and patient medications and allergies were                         reviewed. The patient's tolerance of previous                         anesthesia was also reviewed. The risks and benefits                         of the procedure and the sedation options and risks                         were discussed with the patient. All questions were                         answered, and informed consent was obtained. Prior                         Anticoagulants: The patient has taken no previous                         anticoagulant or antiplatelet agents. ASA Grade                         Assessment: II - A patient with mild systemic disease.                         After reviewing the risks and benefits, the patient                         was deemed in satisfactory condition to undergo the  procedure.                        After obtaining informed consent, the colonoscope was                         passed under direct vision. Throughout the procedure,                         the patient's blood pressure, pulse, and oxygen                          saturations were monitored continuously. The                         Colonoscope was introduced through the anus and                         advanced to the the cecum, identified by appendiceal                         orifice and ileocecal valve. The colonoscopy was                         performed without difficulty. The patient tolerated                         the procedure well. The quality of the bowel                         preparation was excellent. Findings:      The perianal and digital rectal examinations were normal.      A benign-appearing, intrinsic moderate stenosis was found in the sigmoid       colon and was traversed. Impression:            - Stricture in the sigmoid colon.                        - No specimens collected. Recommendation:        - Discharge patient to home.                        - Resume previous diet.                        - Continue present medications.                        - Await pathology results.                        - Repeat colonoscopy in 3 years for surveillance. Procedure Code(s):     --- Professional ---                        [id number], Colonoscopy, flexible; diagnostic, including                         collection of specimen(s) by brushing or washing, when  performed (separate procedure) Diagnosis Code(s):     --- Professional ---                        [id number], Personal history of other malignant neoplasm                         of large intestine CPT copyright 2019 American Medical Association. All rights reserved. The codes documented in this report are preliminary and upon coder review may  be revised to meet current compliance requirements. [name] MD, MD 10/29/2020 7:51:42 AM This report has been signed electronically. Number of Addenda: 0 Note Initiated On: 10/29/2020 7:19 AM Scope Withdrawal Time: 0 hours 5 minutes 35 seconds  Total Procedure Duration: 0 hours 8 minutes 26 seconds   Estimated Blood Loss:  Estimated blood loss: none.      [facility]

## 2020-10-29 NOTE — Anesthesia Preprocedure Evaluation (Signed)
Anesthesia Evaluation  Patient identified by MRN, date of birth, ID band Patient awake    Reviewed: Allergy & Precautions, NPO status   Airway Mallampati: II  TM Distance: >3 FB     Dental no notable dental hx.    Pulmonary shortness of breath, Current Smoker and Patient abstained from smoking.,    Pulmonary exam normal breath sounds clear to auscultation       Cardiovascular Exercise Tolerance: Good negative cardio ROS Normal cardiovascular exam Rhythm:Regular Rate:Normal     Neuro/Psych negative neurological ROS  negative psych ROS   GI/Hepatic Neg liver ROS, Colon cancer   Endo/Other  negative endocrine ROS  Renal/GU negative Renal ROS  negative genitourinary   Musculoskeletal negative musculoskeletal ROS (+)   Abdominal (+) - obese,   Peds  Hematology negative hematology ROS (+)   Anesthesia Other Findings   Reproductive/Obstetrics negative OB ROS                             Anesthesia Physical  Anesthesia Plan  ASA: III  Anesthesia Plan: General   Post-op Pain Management:    Induction: Intravenous  PONV Risk Score and Plan: Propofol infusion, TIVA and Treatment may vary due to age or medical condition  Airway Management Planned: Natural Airway and Nasal Cannula  Additional Equipment:   Intra-op Plan:   Post-operative Plan:   Informed Consent: I have reviewed the patients History and Physical, chart, labs and discussed the procedure including the risks, benefits and alternatives for the proposed anesthesia with the patient or authorized representative who has indicated his/her understanding and acceptance.     Dental advisory given  Plan Discussed with: CRNA  Anesthesia Plan Comments:         Anesthesia Quick Evaluation  Patient Active Problem List   Diagnosis Date Noted  . S/P colostomy takedown 01/13/2020  . Personal history of colon cancer   .  Stenosis of intestine ([place])   . Polyp of colon   . Goals of care, counseling/discussion 03/17/2019  . Cancer of sigmoid ([place]) 02/06/2019  . Special screening for malignant neoplasms, colon   . Neoplasm of digestive system     CBC Latest Ref Rng & Units 10/04/2020 04/30/2020 01/14/2020  WBC 4.0 - 10.5 K/uL 4.9 6.4 10.3  Hemoglobin 13.0 - 17.0 g/dL 12.4(L) 13.0 12.1(L)  Hematocrit 39.0 - 52.0 % 37.5(L) 38.7(L) 36.8(L)  Platelets 150 - 400 K/uL 133(L) 157 127(L)   BMP Latest Ref Rng & Units 10/04/2020 09/24/2020 04/30/2020  Glucose 70 - 99 mg/dL 99 - 101(H)  BUN 6 - 20 mg/dL 19 - 10  Creatinine 0.61 - 1.24 mg/dL 0.95 0.90 0.73  BUN/Creat Ratio 9 - 20 - - -  Sodium 135 - 145 mmol/L 137 - 140  Potassium 3.5 - 5.1 mmol/L 3.9 - 3.8  Chloride 98 - 111 mmol/L 105 - 103  CO2 22 - 32 mmol/L 24 - 28  Calcium 8.9 - 10.3 mg/dL 9.6 - 9.0    Risks and benefits of anesthesia discussed at length, patient or surrogate demonstrates understanding. Appropriately NPO. Plan to proceed with anesthesia.  Champ Mungo, MD 10/29/20

## 2020-10-29 NOTE — H&P (Signed)
[name], MD [address] [place], [place] [address] [phone] Fax : [phone]  Primary Care Physician:  Kendell Bane, NP Primary Gastroenterologist:  Dr. Allen Norris  Pre-Procedure History & Physical: HPI:  Keith Trujillo is a 60 y.o. male is here for an colonoscopy.   Past Medical History:  Diagnosis Date   Abdominal pain    Allergy    Cancer ([place])    Cellulitis    Colon cancer ([facility])    Dyspnea    Liver spot    Loose stools    Weight loss     Past Surgical History:  Procedure Laterality Date   COLONOSCOPY WITH PROPOFOL N/A 01/02/2019   Procedure: COLONOSCOPY WITH BIOPSY;  Surgeon: [name], MD;  Location: [place];  Service: Endoscopy;  Laterality: N/A;  Clip x2 at Biopsy Site   COLOSTOMY Right 02/06/2019   Procedure: COLOSTOMY;  Surgeon: [name], MD;  Location: [facility];  Service: General;  Laterality: Right;   excision of skin lesion     FLEXIBLE SIGMOIDOSCOPY N/A 10/23/2019   Procedure: FLEXIBLE SIGMOIDOSCOPY WITH BIOPSY and DILATION;  Surgeon: [name], MD;  Location: [place];  Service: Endoscopy;  Laterality: N/A;  needs port accessed   ILEOSTOMY CLOSURE N/A 01/13/2020   Procedure: ILEOSTOMY TAKEDOWN;  Surgeon: [name], MD;  Location: [facility];  Service: General;  Laterality: N/A;   LAPAROSCOPIC SIGMOID COLECTOMY N/A 02/06/2019   Procedure: LAPAROSCOPIC SIGMOID COLECTOMY converted to open procedure;  Surgeon: [name], MD;  Location: [facility];  Service: General;  Laterality: N/A;   POLYPECTOMY N/A 10/23/2019   Procedure: POLYPECTOMY;  Surgeon: [name], MD;  Location: [place];  Service: Endoscopy;  Laterality: N/A;   PORT-A-CATH REMOVAL N/A 01/13/2020   Procedure: REMOVAL PORT-A-CATH;  Surgeon: [name], MD;  Location: [facility];  Service: General;  Laterality: N/A;   PORTACATH PLACEMENT Right 03/06/2019   Procedure: INSERTION PORT-A-CATH;  Surgeon: [name], MD;   Location: [facility];  Service: General;  Laterality: Right;   [place]    Prior to Admission medications   Medication Sig Start Date End Date Taking? Authorizing Provider  cetirizine (ZYRTEC) 10 MG tablet Take 10 mg by mouth daily.   Yes [provider]  loratadine (CLARITIN) 10 MG tablet Take 10 mg by mouth daily.    Yes [provider]  Multiple Vitamin (MULTIVITAMIN) tablet Take 1 tablet by mouth daily. Centrum   Yes [provider]  Na Sulfate-K Sulfate-Mg Sulf (SUPREP BOWEL PREP KIT) 17.5-3.13-1.6 GM/177ML SOLN Take 1 kit by mouth as directed. 10/27/20   [name], MD    Allergies as of 10/06/2020 - Review Complete 10/04/2020  Allergen Reaction Noted   Sulfa antibiotics Rash 04/09/2018    Family History  Problem Relation Age of Onset   Lung cancer Father     Social History   Socioeconomic History   Marital status: Married    Spouse name: Not on file   Number of children: Not on file   Years of education: Not on file   Highest education level: Not on file  Occupational History   Not on file  Tobacco Use   Smoking status: Light Smoker    Types: Cigars   Smokeless tobacco: Former    Quit date: 1980  Scientific laboratory technician Use: Never used  Substance and Sexual Activity   Alcohol use: Not Currently    Comment:  rare   Drug use: Never   Sexual activity: Yes  Other Topics Concern   Not on file  Social History Narrative   Not on file   Social Determinants of Health   Financial Resource Strain: Not on file  Food Insecurity: Not on file  Transportation Needs: Not on file  Physical Activity: Not on file  Stress: Not on file  Social Connections: Not on file  Intimate Partner Violence: Not on file    Review of Systems: See HPI, otherwise negative ROS  Physical Exam: BP (!) 151/73   Pulse 63   Temp 97.7 F (36.5 C) (Temporal)   Resp 18   Ht _0  (1.93 m)   Wt 74.8 kg   SpO2 99%   BMI 20.08 kg/m   General:   Alert,  pleasant and cooperative in NAD Head:  Normocephalic and atraumatic. Neck:  Supple; no masses or thyromegaly. Lungs:  Clear throughout to auscultation.    Heart:  Regular rate and rhythm. Abdomen:  Soft, nontender and nondistended. Normal bowel sounds, without guarding, and without rebound.   Neurologic:  Alert and  oriented x4;  grossly normal neurologically.  Impression/Plan: Keith Trujillo is here for an colonoscopy to be performed for a history of adenomatous polyps on 2021   Risks, benefits, limitations, and alternatives regarding  colonoscopy have been reviewed with the patient.  Questions have been answered.  All parties agreeable.   [name], MD  10/29/2020, 7:28 AM

## 2020-10-29 NOTE — Anesthesia Procedure Notes (Addendum)
Date/Time: 10/29/2020 7:36 AM Performed by: Cameron Ali, CRNA Pre-anesthesia Checklist: Patient identified, Emergency Drugs available, Suction available, Timeout performed and Patient being monitored Patient Re-evaluated:Patient Re-evaluated prior to induction Oxygen Delivery Method: Nasal cannula Placement Confirmation: positive ETCO2

## 2020-10-29 NOTE — Transfer of Care (Signed)
Immediate Anesthesia Transfer of Care Note  Patient: Keith Trujillo  Procedure(s) Performed: COLONOSCOPY WITH PROPOFOL  Patient Location: PACU  Anesthesia Type: General  Level of Consciousness: awake, alert  and patient cooperative  Airway and Oxygen Therapy: Patient Spontanous Breathing and Patient connected to supplemental oxygen  Post-op Assessment: Post-op Vital signs reviewed, Patient's Cardiovascular Status Stable, Respiratory Function Stable, Patent Airway and No signs of Nausea or vomiting  Post-op Vital Signs: Reviewed and stable  Complications: No notable events documented.

## 2020-11-01 ENCOUNTER — Encounter: Payer: Self-pay | Admitting: Gastroenterology

## 2021-02-04 ENCOUNTER — Other Ambulatory Visit: Payer: Self-pay

## 2021-02-04 ENCOUNTER — Inpatient Hospital Stay (HOSPITAL_BASED_OUTPATIENT_CLINIC_OR_DEPARTMENT_OTHER): Payer: 59 | Admitting: Nurse Practitioner

## 2021-02-04 ENCOUNTER — Inpatient Hospital Stay: Payer: 59 | Attending: Oncology

## 2021-02-04 VITALS — BP 132/73 | HR 67 | Temp 96.7°F | Resp 16 | Wt 167.1 lb

## 2021-02-04 DIAGNOSIS — Z801 Family history of malignant neoplasm of trachea, bronchus and lung: Secondary | ICD-10-CM | POA: Insufficient documentation

## 2021-02-04 DIAGNOSIS — Z08 Encounter for follow-up examination after completed treatment for malignant neoplasm: Secondary | ICD-10-CM | POA: Diagnosis not present

## 2021-02-04 DIAGNOSIS — R970 Elevated carcinoembryonic antigen [CEA]: Secondary | ICD-10-CM | POA: Insufficient documentation

## 2021-02-04 DIAGNOSIS — Z800 Family history of malignant neoplasm of digestive organs: Secondary | ICD-10-CM | POA: Insufficient documentation

## 2021-02-04 DIAGNOSIS — Z85038 Personal history of other malignant neoplasm of large intestine: Secondary | ICD-10-CM | POA: Diagnosis present

## 2021-02-04 DIAGNOSIS — Z87891 Personal history of nicotine dependence: Secondary | ICD-10-CM | POA: Diagnosis not present

## 2021-02-04 LAB — COMPREHENSIVE METABOLIC PANEL
ALT: 32 U/L (ref 0–44)
AST: 27 U/L (ref 15–41)
Albumin: 4.7 g/dL (ref 3.5–5.0)
Alkaline Phosphatase: 43 U/L (ref 38–126)
Anion gap: 10 (ref 5–15)
BUN: 22 mg/dL (ref 8–23)
CO2: 28 mmol/L (ref 22–32)
Calcium: 9.6 mg/dL (ref 8.9–10.3)
Chloride: 100 mmol/L (ref 98–111)
Creatinine, Ser: 0.73 mg/dL (ref 0.61–1.24)
GFR, Estimated: >60 mL/min (ref >60)
Glucose, Bld: 101 mg/dL — ABNORMAL HIGH (ref 70–99)
Potassium: 4 mmol/L (ref 3.5–5.1)
Sodium: 138 mmol/L (ref 135–145)
Total Bilirubin: 0.6 mg/dL (ref 0.3–1.2)
Total Protein: 7.2 g/dL (ref 6.5–8.1)

## 2021-02-04 LAB — CBC WITH DIFFERENTIAL/PLATELET
Abs Immature Granulocytes: 0.01 10*3/uL (ref 0.00–0.07)
Basophils Absolute: 0 10*3/uL (ref 0.0–0.1)
Basophils Relative: 1 %
Eosinophils Absolute: 0.1 10*3/uL (ref 0.0–0.5)
Eosinophils Relative: 3 %
HCT: 41.1 % (ref 39.0–52.0)
Hemoglobin: 13.5 g/dL (ref 13.0–17.0)
Immature Granulocytes: 0 %
Lymphocytes Relative: 23 %
Lymphs Abs: 1.3 10*3/uL (ref 0.7–4.0)
MCH: 32 pg (ref 26.0–34.0)
MCHC: 32.8 g/dL (ref 30.0–36.0)
MCV: 97.4 fL (ref 80.0–100.0)
Monocytes Absolute: 0.4 10*3/uL (ref 0.1–1.0)
Monocytes Relative: 6 %
Neutro Abs: 3.8 10*3/uL (ref 1.7–7.7)
Neutrophils Relative %: 67 %
Platelets: 129 10*3/uL — ABNORMAL LOW (ref 150–400)
RBC: 4.22 MIL/uL (ref 4.22–5.81)
RDW: 13.2 % (ref 11.5–15.5)
WBC: 5.7 10*3/uL (ref 4.0–10.5)
nRBC: 0 % (ref 0.0–0.2)

## 2021-02-04 NOTE — Progress Notes (Signed)
Hematology/Oncology Consult Note [facility]  [phone] [phone]  Patient Care Team: Kendell Bane, NP as PCP - General (Adult Health Nurse Practitioner) Clent Jacks, RN as Oncology Nurse Navigator Pabon, Marjory Lies, MD as Consulting Physician (General Surgery) Sindy Guadeloupe, MD as Consulting Physician (Oncology)   Name of the patient: Keith Trujillo  [id number]  22-Feb-1961   Date of visit: 02/04/21  Diagnosis- stage IIIb colon adenocarcinoma   Chief complaint/ Reason for visit-routine follow-up visit of colon cancer  Heme/Onc history: patient is a 60 year old male who was seen by Dr. Miguel Medal Norris from GI for symptoms of diarrhea as well as abdominal pain.He underwent a colonoscopy on 01/02/2019 which showed an ulcerated partially obstructing large mass in the sigmoid colon which measured 10 cm in length biopsy showed adenocarcinoma moderately differentiated.  Baseline CEA elevated at 71.7.   CT abdomen pelvis with contrast showed irregular marked circumferential wall thickening in the sigmoid colon near the rectosigmoid junction.  The lesion longitudinally extends for approximately 5 to 6 cm.  4.1 x 2.8 cm ill-defined irregular paracolonic soft tissue mass may represent direct tumor extension or contiguous markedly enlarged lymph node.  Abnormal lymph nodes also seen in the perirectal and presacral space concerning for metastatic disease.  1.5 cm ill-defined hypoattenuating lesion in the medial segment of the left liver which may be focal fatty deposition but cannot be definitively characterized.  MRI did not show any evidence of liver metastases.  CT chest was negative for metastatic disease.   Patient underwent a low anterior resection of the sigmoid mass.  Final pathology showed 8 cm invasive colorectal adenocarcinoma grade 2.  Tumor invades through the muscularis propria into pericolorectal tissue.  All margins are uninvolvedLymphovascular  invasion and perineural invasion present.  Lymph nodes 1+ out of 20 PT3PN1A.  MSI stable   Patient completed 12 cycles of adjuvant FOLFOX chemotherapy on 09/16/2019  Interval history- Patient is 60 year old male who returns to clinic for labs, and continued surveillance of colon cancer. He continues to feel well and denies specific complaints. He has adjusted his diet and now has good control over his bowel movements.   ECOG PS- 1 Pain scale- 0  Review of systems- Review of Systems  Constitutional:  Negative for chills, fever, malaise/fatigue and weight loss.  HENT:  Negative for congestion, ear discharge and nosebleeds.   Eyes:  Negative for blurred vision.  Respiratory:  Negative for cough, hemoptysis, sputum production, shortness of breath and wheezing.   Cardiovascular:  Negative for chest pain, palpitations, orthopnea and claudication.  Gastrointestinal:  Negative for abdominal pain, blood in stool, constipation, diarrhea, heartburn, melena, nausea and vomiting.  Genitourinary:  Negative for dysuria, flank pain, frequency, hematuria and urgency.  Musculoskeletal:  Negative for back pain, joint pain and myalgias.  Skin:  Negative for rash.  Neurological:  Negative for dizziness, tingling, focal weakness, seizures, weakness and headaches.  Endo/Heme/Allergies:  Does not bruise/bleed easily.  Psychiatric/Behavioral:  Negative for depression and suicidal ideas. The patient does not have insomnia.     Allergies  Allergen Reactions   Sulfa Antibiotics Rash     Past Medical History:  Diagnosis Date   Abdominal pain    Allergy    Cancer ([place])    Cellulitis    Colon cancer ([facility])    Dyspnea    Liver spot    Loose stools    Weight loss     Past Surgical History:  Procedure Laterality Date  COLONOSCOPY WITH PROPOFOL N/A 01/02/2019   Procedure: COLONOSCOPY WITH BIOPSY;  Surgeon: [name], MD;  Location: [place];  Service: Endoscopy;  Laterality: N/A;  Clip x2 at  Biopsy Site   COLONOSCOPY WITH PROPOFOL N/A 10/29/2020   Procedure: COLONOSCOPY WITH PROPOFOL;  Surgeon: [name], MD;  Location: [place];  Service: Endoscopy;  Laterality: N/A;   COLOSTOMY Right 02/06/2019   Procedure: COLOSTOMY;  Surgeon: [name], MD;  Location: [facility];  Service: General;  Laterality: Right;   excision of skin lesion     FLEXIBLE SIGMOIDOSCOPY N/A 10/23/2019   Procedure: FLEXIBLE SIGMOIDOSCOPY WITH BIOPSY and DILATION;  Surgeon: [name], MD;  Location: [facility];  Service: Endoscopy;  Laterality: N/A;  needs port accessed   ILEOSTOMY CLOSURE N/A 01/13/2020   Procedure: ILEOSTOMY TAKEDOWN;  Surgeon: [name], MD;  Location: [facility];  Service: General;  Laterality: N/A;   LAPAROSCOPIC SIGMOID COLECTOMY N/A 02/06/2019   Procedure: LAPAROSCOPIC SIGMOID COLECTOMY converted to open procedure;  Surgeon: [name], MD;  Location: [facility];  Service: General;  Laterality: N/A;   POLYPECTOMY N/A 10/23/2019   Procedure: POLYPECTOMY;  Surgeon: [name], MD;  Location: [place];  Service: Endoscopy;  Laterality: N/A;   PORT-A-CATH REMOVAL N/A 01/13/2020   Procedure: REMOVAL PORT-A-CATH;  Surgeon: [name], MD;  Location: [facility];  Service: General;  Laterality: N/A;   PORTACATH PLACEMENT Right 03/06/2019   Procedure: INSERTION PORT-A-CATH;  Surgeon: [name], MD;  Location: [facility];  Service: General;  Laterality: Right;   TONSILLECTOMY AND ADENOIDECTOMY  1969    Social History   Socioeconomic History   Marital status: Married    Spouse name: Not on file   Number of children: Not on file   Years of education: Not on file   Highest education level: Not on file  Occupational History   Not on file  Tobacco Use   Smoking status: Light Smoker    Types: Cigars   Smokeless tobacco: Former    Quit date: 1980  Scientific laboratory technician Use: Never used  Substance and Sexual Activity   Alcohol use: Not Currently     Comment: rare   Drug use: Never   Sexual activity: Yes  Other Topics Concern   Not on file  Social History Narrative   Not on file   Social Determinants of Health   Financial Resource Strain: Not on file  Food Insecurity: Not on file  Transportation Needs: Not on file  Physical Activity: Not on file  Stress: Not on file  Social Connections: Not on file  Intimate Partner Violence: Not on file    Family History  Problem Relation Age of Onset   Lung cancer Father     Current Outpatient Medications:    cetirizine (ZYRTEC) 10 MG tablet, Take 10 mg by mouth daily., Disp: , Rfl:    Multiple Vitamin (MULTIVITAMIN) tablet, Take 1 tablet by mouth daily. Centrum, Disp: , Rfl:    loratadine (CLARITIN) 10 MG tablet, Take 10 mg by mouth daily.  (Patient not taking: Reported on 02/04/2021), Disp: , Rfl:    Na Sulfate-K Sulfate-Mg Sulf (SUPREP BOWEL PREP KIT) 17.5-3.13-1.6 GM/177ML SOLN, Take 1 kit by mouth as directed. (Patient not taking: Reported on 02/04/2021), Disp: 354 mL, Rfl: 0 No current facility-administered medications for this visit.  Facility-Administered Medications Ordered in Other Visits:    sodium chloride flush (NS) 0.9 % injection 10 mL, 10  mL, Intravenous, PRN, Sindy Guadeloupe, MD, 10 mL at 09/02/19 0832  Physical exam:  Vitals:   02/04/21 1258  BP: 132/73  Pulse: 67  Resp: 16  Temp: (!) 96.7 F (35.9 C)  TempSrc: Tympanic  SpO2: 100%  Weight: 167 lb 1.6 oz (75.8 kg)   Physical Exam Cardiovascular:     Rate and Rhythm: Normal rate and regular rhythm.     Heart sounds: Normal heart sounds.  Pulmonary:     Effort: Pulmonary effort is normal.     Breath sounds: Normal breath sounds.  Abdominal:     General: Bowel sounds are normal.     Palpations: Abdomen is soft.     Comments: Colostomy takedown scar is seen  Skin:    General: Skin is warm and dry.  Neurological:     Mental Status: He is alert and oriented to person, place, and time.     CMP Latest  Ref Rng & Units 02/04/2021  Glucose 70 - 99 mg/dL 101(H)  BUN 8 - 23 mg/dL 22  Creatinine 0.61 - 1.24 mg/dL 0.73  Sodium 135 - 145 mmol/L 138  Potassium 3.5 - 5.1 mmol/L 4.0  Chloride 98 - 111 mmol/L 100  CO2 22 - 32 mmol/L 28  Calcium 8.9 - 10.3 mg/dL 9.6  Total Protein 6.5 - 8.1 g/dL 7.2  Total Bilirubin 0.3 - 1.2 mg/dL 0.6  Alkaline Phos 38 - 126 U/L 43  AST 15 - 41 U/L 27  ALT 0 - 44 U/L 32   CBC Latest Ref Rng & Units 02/04/2021  WBC 4.0 - 10.5 K/uL 5.7  Hemoglobin 13.0 - 17.0 g/dL 13.5  Hematocrit 39.0 - 52.0 % 41.1  Platelets 150 - 400 K/uL 129(L)   No images are attached to the encounter.  No results found.  Assessment and plan- Patient is a 60 y.o. 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  He s/p 12 cycles of adjuvant FOLFOX chemotherapy and this is a routine follow-up visit  Clinically patient is doing well with no concerning signs and symptoms of recurrence based on today's exam.  CT chest abdomen and pelvis with contrast on 09/24/2020 was negative for recurrence. Surveillance colonoscopy with Dr. Jaryiah Mehlman Norris 10/29/20 was negative. Recommend repeating colonoscopy 10/2023. Today, CEA low and normal at 2.0. Monitor.   Continue surveillance- 4 months lab (cbc, cmp, cea) and Rao.   Visit Diagnosis 1. Encounter for follow-up surveillance of colon cancer    Beckey Rutter, [place], AGNP-C [place] at [facility] [phone] (clinic) 02/04/2021

## 2021-02-04 NOTE — Progress Notes (Signed)
Pt reports some trouble sleeping but relates this to caring for wife on hospice care. No other concerns at this time.

## 2021-02-05 LAB — CEA: CEA: 2 ng/mL (ref 0.0–4.7)

## 2021-02-16 ENCOUNTER — Encounter: Payer: Self-pay | Admitting: Oncology

## 2021-03-23 IMAGING — CT CT CHEST W/ CM
3 of 6 series · 13 of 36 positions shown, 15 images · IV contrast (omnipaque)
Comparison: CT chest from Monday February, 2019, MRI abdomen from
Monday February, 2019 and CT abdomen and pelvis from Saturday January, 2019

CLINICAL DATA: Chemotherapy, history of colon cancer.

EXAM:
CT CHEST, ABDOMEN, AND PELVIS WITH CONTRAST
TECHNIQUE: Multidetector CT imaging of the chest, abdomen and pelvis was
performed following the standard protocol during bolus
administration of intravenous contrast.
CONTRAST:  100mL OMNIPAQUE IOHEXOL 300 MG/ML  SOLN

[Series 2: cap with · axial · 0.77mm/px · z∈[-663,-183]mm · 5 of 146 slices shown, 7 images]
[im 25/146  mediastinal]
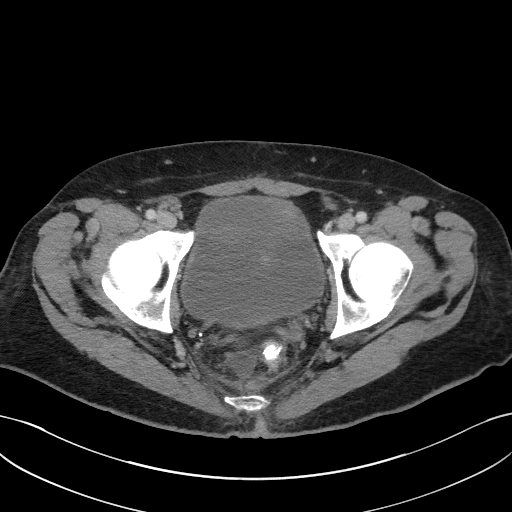
[im 25/146  lung]
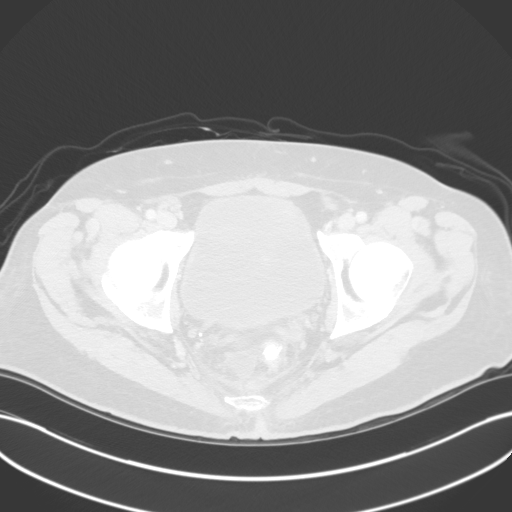
[im 49/146  lung]
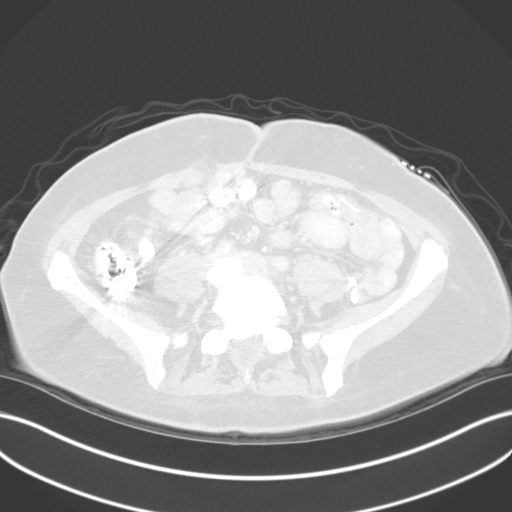
[im 73/146  lung]
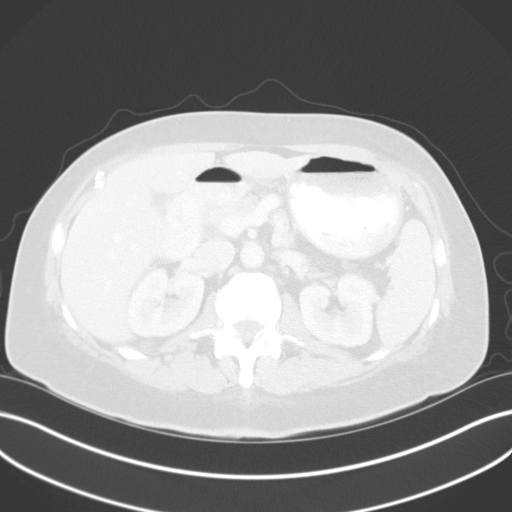
[im 97/146  lung]
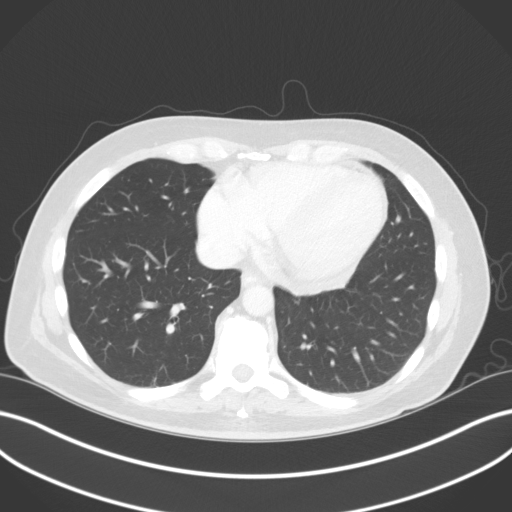
[im 121/146  mediastinal]
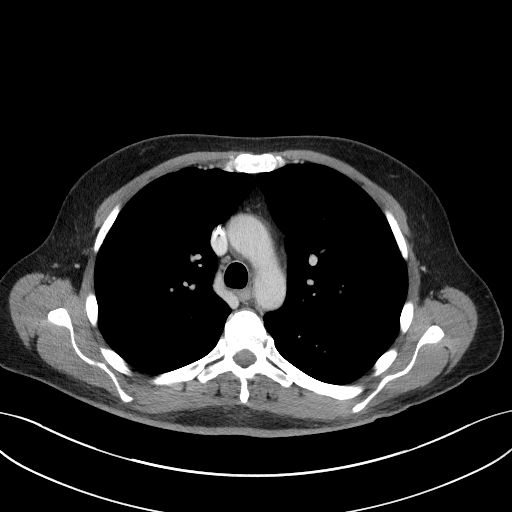
[im 121/146  lung]
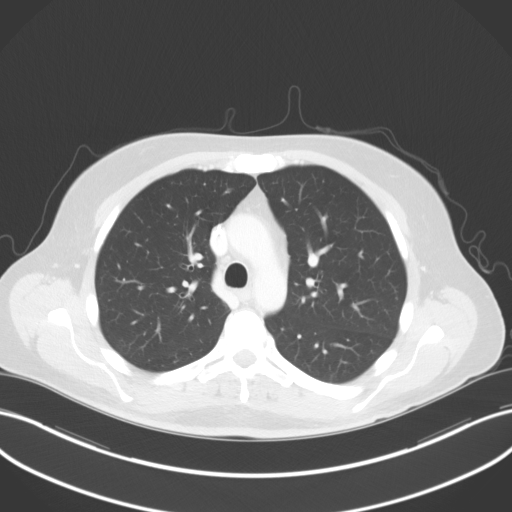

[Series 4: lung · axial · 0.77mm/px · z∈[-356,-158]mm · 5 of 174 slices shown]
[im 25/174  lung]
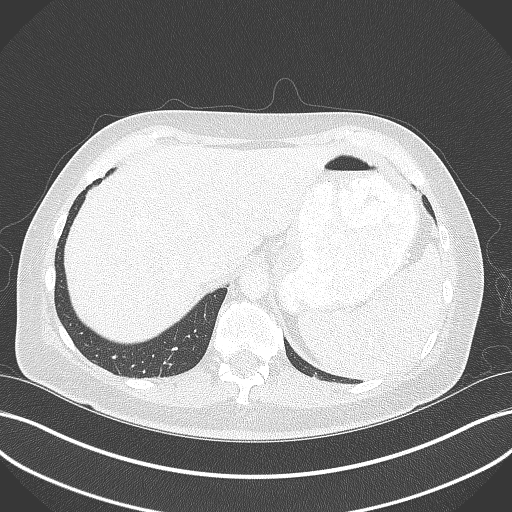
[im 50/174  lung]
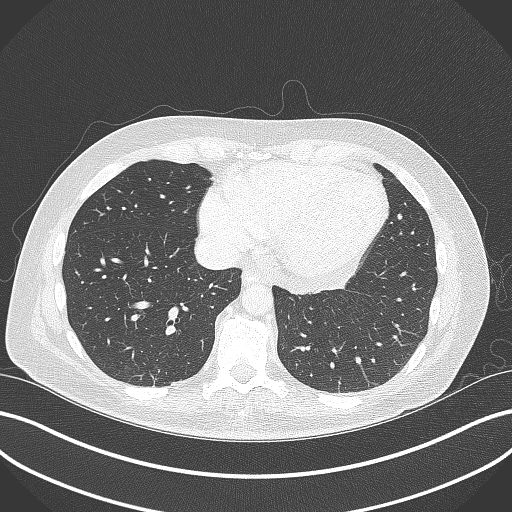
[im 75/174  lung]
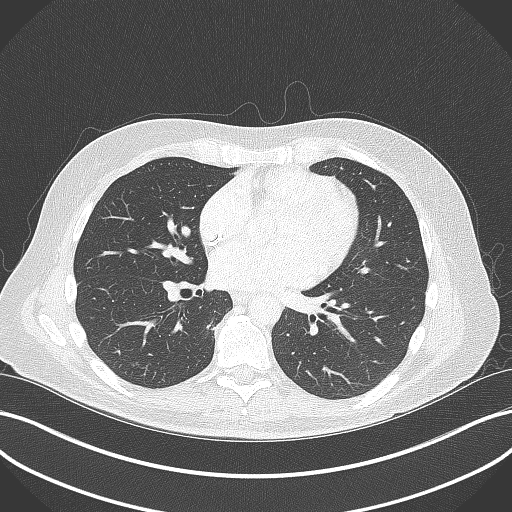
[im 99/174  lung]
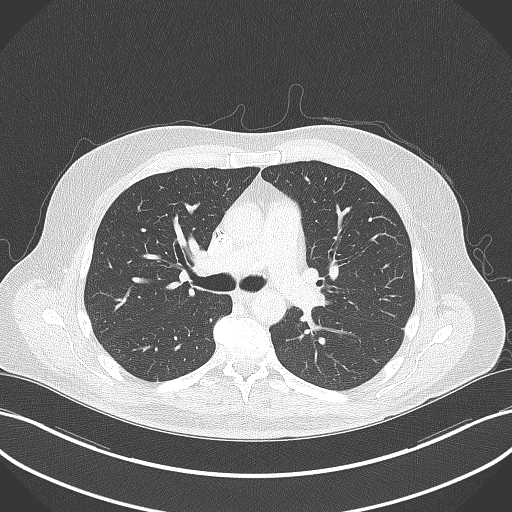
[im 124/174  lung]
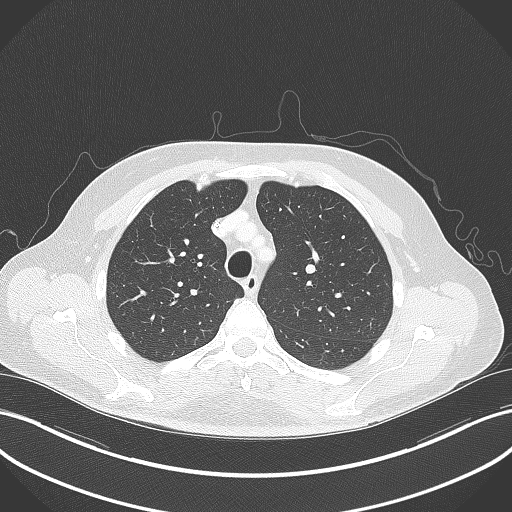

[Series 5: coronals · coronal · 0.87mm/px · 3 of 133 slices shown]
[im 27/133  lung]
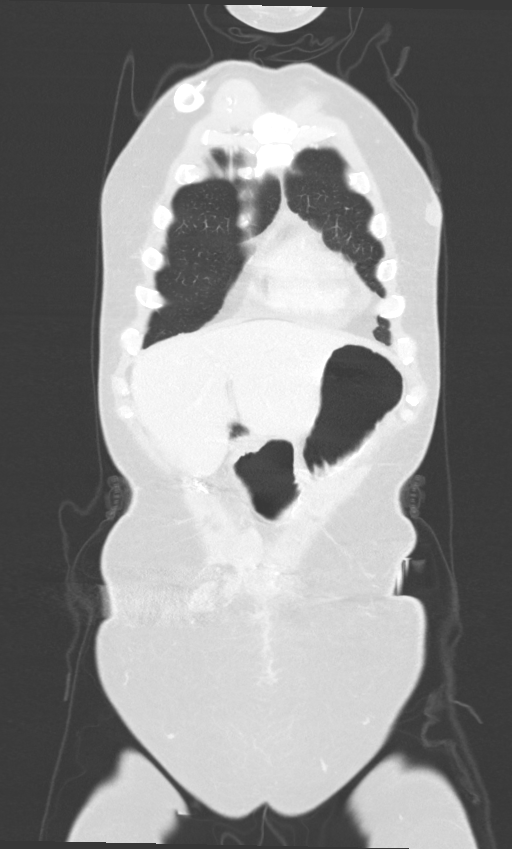
[im 53/133  lung]
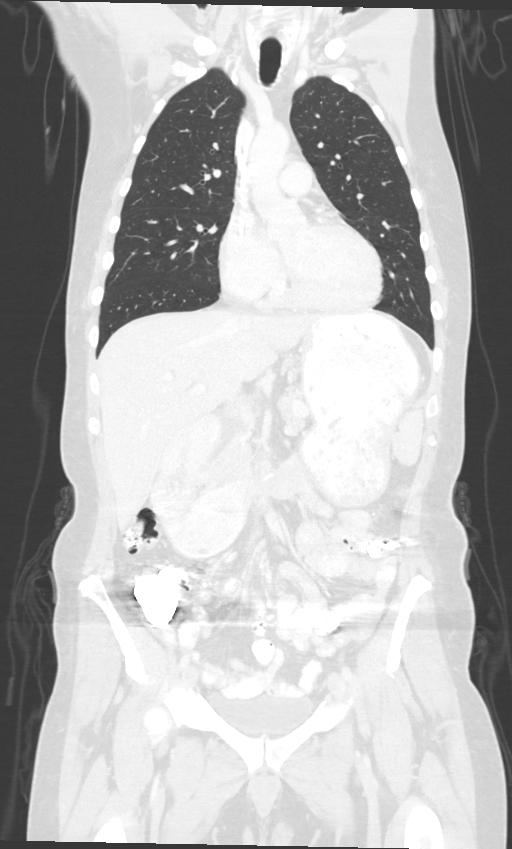
[im 80/133  lung]
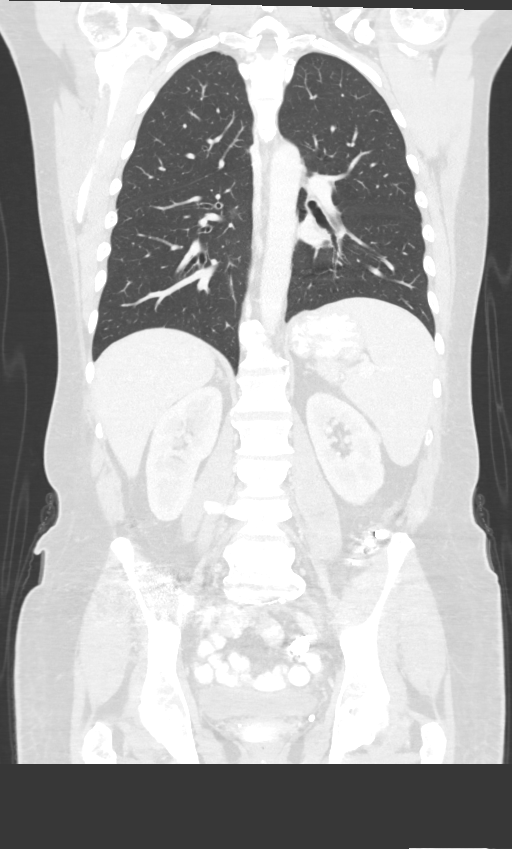

[13 of 36 positions shown; findings below may reference images not displayed]

FINDINGS: CT CHEST FINDINGS

Cardiovascular: Heart size is normal. Unchanged. No pericardial
effusion. Aortic caliber and contour is normal. Central pulmonary
arteries on venous phase assessment are normal. RIGHT IJ Port-A-Cath
terminates at the caval to atrial junction.

Mediastinum/Nodes: Thoracic inlet structures are normal. No axillary
lymphadenopathy. No mediastinal lymphadenopathy. No hilar
adenopathy.

Lungs/Pleura: No consolidation.  No sign of pleural effusion.

Basilar atelectasis. Airways are patent. Tiny LEFT lower lobe nodule
approximately 3 mm is unchanged. (Image 116,)

Musculoskeletal: No chest wall lesion. No acute bone finding. No
destructive bone process involving the bony thorax.

CT ABDOMEN PELVIS FINDINGS

Hepatobiliary: Mildly lobular hepatic contours. No suspicious focal
hepatic lesion. No pericholecystic stranding. Mild distension of the
common bile duct intrahepatic biliary tree with a similar appearance
to previous imaging, common exceeding 6 mm in the absence of
cholecystectomy.

Pancreas: Pancreas without ductal dilation, inflammation or focal
lesion.

Spleen: Spleen mildly enlarged 14 cm greatest craniocaudal extent as
compared to approximately 11 cm on the prior study.

Adrenals/Urinary Tract: Adrenal glands are normal.

No sign of hydronephrosis.

No suspicious renal lesion.

Stomach/Bowel:

Marked artifact from retained barium within the fecal stream from
prior barium enema likely from Sunday May, 2019. This limits assessment
of bowel loops particularly in the lower abdomen and of adjacent
structures.

RIGHT lower quadrant diverting ostomy.

Mildly nodular changes along pelvic fascial planes (image 118,
series 2) to the LEFT of the rectal anastomosis measuring 7 mm
greatest thickness.

Small amount of fluid density also seen in the pelvis measuring
approximately 2 x 1.8 cm again difficult to assess due to marked
increased density in the setting of retained barium. Stomach filled
with ingested contrast material.

Stomach and duodenum are distended. Filled with ingested enteric
contrast material. Distal small bowel loops are normal though bowel
loops in the RIGHT lower quadrant show limited assessment due to the
barium described above.

Vascular/Lymphatic: No adenopathy in the retroperitoneum. No
adenopathy in the pelvis. Aortic caliber is normal. Vascular
structures in the abdomen are patent.

Reproductive: Prostate grossly normal though with very limited
assessment due to barium and on CT.

Other: Diverting ostomy in the RIGHT lower quadrant.

Musculoskeletal: Presacral density measuring approximately 13 by 20
mm on image 118 of series 2 adjacent to fluid and fascial nodularity
discussed above.

No destructive bone lesion or acute bone finding.
IMPRESSION: 1. Marked artifact from retained barium within the fecal stream from
prior barium enema likely from Sunday May, 2019. This limits assessment
of bowel loops particularly in the lower abdomen and of adjacent
structures.
2. Nodular changes and areas of fluid and or soft tissue density in
the pelvis adjacent to the anastomosis may reflect post
surgical/post treatment changes. This will serve as a baseline for
further follow-up. The lack of stranding argues against acute
process but would correlate with any clinical symptoms in the
pelvis. No gas is present within these areas.
3. Mild distension of the common bile duct intrahepatic biliary tree
with a similar appearance to previous imaging, common bile duct
exceeding 6 mm in the absence of cholecystectomy. Clinical and
laboratory correlation may be helpful to determine whether further
biliary evaluation may be warranted.
4. Slightly increased size of the spleen compared to previous
imaging, of uncertain significance but without focal abnormality.
5. Tiny 3 mm LEFT lower lobe nodule is unchanged. Attention on
follow-up.
6. No evidence of metastatic disease to the chest.
7. Aortic atherosclerosis.

Aortic Atherosclerosis (60BI9-YIG.G).

## 2021-06-10 ENCOUNTER — Encounter: Payer: Self-pay | Admitting: Oncology

## 2021-06-10 ENCOUNTER — Other Ambulatory Visit: Payer: Self-pay

## 2021-06-10 ENCOUNTER — Inpatient Hospital Stay (HOSPITAL_BASED_OUTPATIENT_CLINIC_OR_DEPARTMENT_OTHER): Payer: 59 | Admitting: Oncology

## 2021-06-10 ENCOUNTER — Inpatient Hospital Stay: Payer: 59 | Attending: Oncology

## 2021-06-10 VITALS — BP 127/73 | HR 71 | Temp 97.5°F | Resp 18 | Ht 76.0 in | Wt 203.7 lb

## 2021-06-10 DIAGNOSIS — Z801 Family history of malignant neoplasm of trachea, bronchus and lung: Secondary | ICD-10-CM | POA: Insufficient documentation

## 2021-06-10 DIAGNOSIS — Z85048 Personal history of other malignant neoplasm of rectum, rectosigmoid junction, and anus: Secondary | ICD-10-CM | POA: Diagnosis not present

## 2021-06-10 DIAGNOSIS — Z85038 Personal history of other malignant neoplasm of large intestine: Secondary | ICD-10-CM

## 2021-06-10 DIAGNOSIS — Z08 Encounter for follow-up examination after completed treatment for malignant neoplasm: Secondary | ICD-10-CM

## 2021-06-10 DIAGNOSIS — Z9221 Personal history of antineoplastic chemotherapy: Secondary | ICD-10-CM | POA: Diagnosis not present

## 2021-06-10 DIAGNOSIS — R970 Elevated carcinoembryonic antigen [CEA]: Secondary | ICD-10-CM | POA: Diagnosis not present

## 2021-06-10 DIAGNOSIS — C187 Malignant neoplasm of sigmoid colon: Secondary | ICD-10-CM

## 2021-06-10 LAB — CBC WITH DIFFERENTIAL/PLATELET
Abs Immature Granulocytes: 0.02 10*3/uL (ref 0.00–0.07)
Basophils Absolute: 0.1 10*3/uL (ref 0.0–0.1)
Basophils Relative: 1 %
Eosinophils Absolute: 0.1 10*3/uL (ref 0.0–0.5)
Eosinophils Relative: 2 %
HCT: 38.8 % — ABNORMAL LOW (ref 39.0–52.0)
Hemoglobin: 12.9 g/dL — ABNORMAL LOW (ref 13.0–17.0)
Immature Granulocytes: 0 %
Lymphocytes Relative: 21 %
Lymphs Abs: 1.2 10*3/uL (ref 0.7–4.0)
MCH: 32.5 pg (ref 26.0–34.0)
MCHC: 33.2 g/dL (ref 30.0–36.0)
MCV: 97.7 fL (ref 80.0–100.0)
Monocytes Absolute: 0.4 10*3/uL (ref 0.1–1.0)
Monocytes Relative: 6 %
Neutro Abs: 4.2 10*3/uL (ref 1.7–7.7)
Neutrophils Relative %: 70 %
Platelets: 129 10*3/uL — ABNORMAL LOW (ref 150–400)
RBC: 3.97 MIL/uL — ABNORMAL LOW (ref 4.22–5.81)
RDW: 13.1 % (ref 11.5–15.5)
WBC: 6 10*3/uL (ref 4.0–10.5)
nRBC: 0 % (ref 0.0–0.2)

## 2021-06-10 LAB — COMPREHENSIVE METABOLIC PANEL
ALT: 29 U/L (ref 0–44)
AST: 25 U/L (ref 15–41)
Albumin: 4.7 g/dL (ref 3.5–5.0)
Alkaline Phosphatase: 38 U/L (ref 38–126)
Anion gap: 10 (ref 5–15)
BUN: 29 mg/dL — ABNORMAL HIGH (ref 8–23)
CO2: 26 mmol/L (ref 22–32)
Calcium: 9.6 mg/dL (ref 8.9–10.3)
Chloride: 101 mmol/L (ref 98–111)
Creatinine, Ser: 0.94 mg/dL (ref 0.61–1.24)
GFR, Estimated: >60 mL/min (ref >60)
Glucose, Bld: 96 mg/dL (ref 70–99)
Potassium: 4.1 mmol/L (ref 3.5–5.1)
Sodium: 137 mmol/L (ref 135–145)
Total Bilirubin: 0.9 mg/dL (ref 0.3–1.2)
Total Protein: 7.2 g/dL (ref 6.5–8.1)

## 2021-06-11 LAB — CEA: CEA: 2 ng/mL (ref 0.0–4.7)

## 2021-06-12 ENCOUNTER — Encounter: Payer: Self-pay | Admitting: Oncology

## 2021-06-12 NOTE — Progress Notes (Signed)
? ? ? ?Hematology/Oncology Consult note ?[place]  ?[phone] [phone] ? ?Patient Care Team: ?Kendell Bane, NP as PCP - General (Adult Health Nurse Practitioner) ?Clent Jacks, RN as Oncology Nurse Navigator ?[name], MD as Consulting Physician (General Surgery) ?Sindy Guadeloupe, MD as Consulting Physician (Oncology)  ? ?Name of the patient: Keith Trujillo  ?[id number]  ?16-Nov-1960  ? ?Date of visit: 06/12/21 ? ?Diagnosis-  stage IIIb colon adenocarcinoma currently in remission  ? ?Chief complaint/ Reason for visit-routine follow-up of colon cancer ? ?Heme/Onc history: patient is a 61 year old male who was seen by Dr. Allen Norris from GI for symptoms of diarrhea as well as abdominal pain.He underwent a colonoscopy on 01/02/2019 which showed an ulcerated partially obstructing large mass in the sigmoid colon which measured 10 cm in length biopsy showed adenocarcinoma moderately differentiated.  Baseline CEA elevated at 71.7. ?  ?CT abdomen pelvis with contrast showed irregular marked circumferential wall thickening in the sigmoid colon near the rectosigmoid junction.  The lesion longitudinally extends for approximately 5 to 6 cm.  4.1 x 2.8 cm ill-defined irregular paracolonic soft tissue mass may represent direct tumor extension or contiguous markedly enlarged lymph node.  Abnormal lymph nodes also seen in the perirectal and presacral space concerning for metastatic disease.  1.5 cm ill-defined hypoattenuating lesion in the medial segment of the left liver which may be focal fatty deposition but cannot be definitively characterized.  MRI did not show any evidence of liver metastases.  CT chest was negative for metastatic disease. ?  ?Patient underwent a low anterior resection of the sigmoid mass.  Final pathology showed 8 cm invasive colorectal adenocarcinoma grade 2.  Tumor invades through the muscularis propria into pericolorectal tissue.  All margins are  uninvolvedLymphovascular invasion and perineural invasion present.  Lymph nodes 1+ out of 20 [id number].  MSI stable ?  ?Patient completed 12 cycles of adjuvant FOLFOX chemotherapy on 09/16/2019 ?  ? ?Interval history-patient had a reversal of his colostomy done.  He still has trouble tolerating certain foods which give him diarrhea but is otherwise doing well. ? ?ECOG PS- 0 ?Pain scale- 0 ? ? ?Review of systems- Review of Systems  ?Constitutional:  Negative for chills, fever, malaise/fatigue and weight loss.  ?HENT:  Negative for congestion, ear discharge and nosebleeds.   ?Eyes:  Negative for blurred vision.  ?Respiratory:  Negative for cough, hemoptysis, sputum production, shortness of breath and wheezing.   ?Cardiovascular:  Negative for chest pain, palpitations, orthopnea and claudication.  ?Gastrointestinal:  Negative for abdominal pain, blood in stool, constipation, diarrhea, heartburn, melena, nausea and vomiting.  ?Genitourinary:  Negative for dysuria, flank pain, frequency, hematuria and urgency.  ?Musculoskeletal:  Negative for back pain, joint pain and myalgias.  ?Skin:  Negative for rash.  ?Neurological:  Negative for dizziness, tingling, focal weakness, seizures, weakness and headaches.  ?Endo/Heme/Allergies:  Does not bruise/bleed easily.  ?Psychiatric/Behavioral:  Negative for depression and suicidal ideas. The patient does not have insomnia.    ? ? ? ?Allergies  ?Allergen Reactions  ? Sulfa Antibiotics Rash  ? ? ? ?Past Medical History:  ?Diagnosis Date  ? Abdominal pain   ? Allergy   ? Cancer [facility])   ? Cellulitis   ? Colon cancer ([place])   ? Dyspnea   ? Liver spot   ? Loose stools   ? Weight loss   ? ? ? ?Past Surgical History:  ?Procedure Laterality Date  ? COLONOSCOPY WITH PROPOFOL N/A 01/02/2019  ?  Procedure: COLONOSCOPY WITH BIOPSY;  Surgeon: [name], MD;  Location: [place];  Service: Endoscopy;  Laterality: N/A;  Clip x2 at Biopsy Site  ? COLONOSCOPY WITH PROPOFOL N/A 10/29/2020  ?  Procedure: COLONOSCOPY WITH PROPOFOL;  Surgeon: [name], MD;  Location: [place];  Service: Endoscopy;  Laterality: N/A;  ? COLOSTOMY Right 02/06/2019  ? Procedure: COLOSTOMY;  Surgeon: [name], MD;  Location: [facility];  Service: General;  Laterality: Right;  ? excision of skin lesion    ? FLEXIBLE SIGMOIDOSCOPY N/A 10/23/2019  ? Procedure: FLEXIBLE SIGMOIDOSCOPY WITH BIOPSY and DILATION;  Surgeon: [name], MD;  Location: [place];  Service: Endoscopy;  Laterality: N/A;  needs port accessed  ? ILEOSTOMY CLOSURE N/A 01/13/2020  ? Procedure: ILEOSTOMY TAKEDOWN;  Surgeon: [name], MD;  Location: [facility];  Service: General;  Laterality: N/A;  ? LAPAROSCOPIC SIGMOID COLECTOMY N/A 02/06/2019  ? Procedure: LAPAROSCOPIC SIGMOID COLECTOMY converted to open procedure;  Surgeon: [name], MD;  Location: [facility];  Service: General;  Laterality: N/A;  ? POLYPECTOMY N/A 10/23/2019  ? Procedure: POLYPECTOMY;  Surgeon: [name], MD;  Location: [place];  Service: Endoscopy;  Laterality: N/A;  ? PORT-A-CATH REMOVAL N/A 01/13/2020  ? Procedure: REMOVAL PORT-A-CATH;  Surgeon: [name], MD;  Location: [facility];  Service: General;  Laterality: N/A;  ? PORTACATH PLACEMENT Right 03/06/2019  ? Procedure: INSERTION PORT-A-CATH;  Surgeon: [name], MD;  Location: [facility];  Service: General;  Laterality: Right;  ? [place]  ? ? ?Social History  ? ?Socioeconomic History  ? Marital status: Married  ?  Spouse name: Not on file  ? Number of children: Not on file  ? Years of education: Not on file  ? Highest education level: Not on file  ?Occupational History  ? Not on file  ?Tobacco Use  ? Smoking status: Light Smoker  ?  Types: Cigars  ? Smokeless tobacco: Former  ?  Quit date: 56  ?Vaping Use  ? Vaping Use: Never used  ?Substance and Sexual Activity  ? Alcohol use: Not Currently  ?  Comment: rare  ? Drug use: Never  ? Sexual activity:  Yes  ?Other Topics Concern  ? Not on file  ?Social History Narrative  ? Not on file  ? ?Social Determinants of Health  ? ?Financial Resource Strain: Not on file  ?Food Insecurity: Not on file  ?Transportation Needs: Not on file  ?Physical Activity: Not on file  ?Stress: Not on file  ?Social Connections: Not on file  ?Intimate Partner Violence: Not on file  ? ? ?Family History  ?Problem Relation Age of Onset  ? Lung cancer Father   ? ? ? ?Current Outpatient Medications:  ?  cetirizine (ZYRTEC) 10 MG tablet, Take 10 mg by mouth daily., Disp: , Rfl:  ?  Multiple Vitamin (MULTIVITAMIN) tablet, Take 1 tablet by mouth daily. Centrum, Disp: , Rfl:  ?  loratadine (CLARITIN) 10 MG tablet, Take 10 mg by mouth daily.  (Patient not taking: Reported on 02/04/2021), Disp: , Rfl:  ?No current facility-administered medications for this visit. ? ?Facility-Administered Medications Ordered in Other Visits:  ?  sodium chloride flush (NS) 0.9 % injection 10 mL, 10 mL, Intravenous, PRN, Sindy Guadeloupe, MD, 10 mL at 09/02/19 8182 ? ?Physical exam:  ?Vitals:  ? 06/10/21 1331  ?BP: 127/73  ?Pulse: 71  ?Resp: 18  ?Temp: (!) 97.5 ?F (36.4 ?C)  ?  SpO2: 100%  ?Weight: 203 lb 11.2 oz (92.4 kg)  ?Height: _0  (1.93 m)  ? ?Physical Exam ?Constitutional:   ?   General: He is not in acute distress. ?Cardiovascular:  ?   Rate and Rhythm: Normal rate and regular rhythm.  ?   Heart sounds: Normal heart sounds.  ?Pulmonary:  ?   Effort: Pulmonary effort is normal.  ?   Breath sounds: Normal breath sounds.  ?Abdominal:  ?   General: Bowel sounds are normal.  ?   Palpations: Abdomen is soft.  ?Skin: ?   General: Skin is warm and dry.  ?Neurological:  ?   Mental Status: He is alert and oriented to person, place, and time.  ?  ? ? ?  Latest Ref Rng & Units 06/10/2021  ?  1:17 PM  ?CMP  ?Glucose 70 - 99 mg/dL 96    ?BUN 8 - 23 mg/dL 29    ?Creatinine 0.61 - 1.24 mg/dL 0.94    ?Sodium 135 - 145 mmol/L 137    ?Potassium 3.5 - 5.1 mmol/L 4.1    ?Chloride 98 -  111 mmol/L 101    ?CO2 22 - 32 mmol/L 26    ?Calcium 8.9 - 10.3 mg/dL 9.6    ?Total Protein 6.5 - 8.1 g/dL 7.2    ?Total Bilirubin 0.3 - 1.2 mg/dL 0.9    ?Alkaline Phos 38 - 126 U/L 38    ?AST 15 - 41 U/L 25    ?

## 2021-06-15 ENCOUNTER — Other Ambulatory Visit: Payer: Self-pay

## 2021-06-15 DIAGNOSIS — Z85038 Personal history of other malignant neoplasm of large intestine: Secondary | ICD-10-CM

## 2021-10-03 ENCOUNTER — Ambulatory Visit
Admission: RE | Admit: 2021-10-03 | Discharge: 2021-10-03 | Disposition: A | Discharge status: Home / Self Care | Payer: 59 | Source: Ambulatory Visit | Attending: Oncology | Admitting: Oncology

## 2021-10-03 DIAGNOSIS — Z08 Encounter for follow-up examination after completed treatment for malignant neoplasm: Secondary | ICD-10-CM | POA: Diagnosis present

## 2021-10-03 DIAGNOSIS — Z85038 Personal history of other malignant neoplasm of large intestine: Secondary | ICD-10-CM | POA: Insufficient documentation

## 2021-10-03 LAB — POCT I-STAT CREATININE: Creatinine, Ser: 1 mg/dL (ref 0.61–1.24)

## 2021-10-03 MED ORDER — IOHEXOL 300 MG/ML  SOLN
100.0000 mL | Freq: Once | INTRAMUSCULAR | Status: AC | PRN
  Administered 2021-10-03: 100 mL via INTRAVENOUS

## 2021-10-10 ENCOUNTER — Other Ambulatory Visit: Payer: Self-pay

## 2021-11-20 ENCOUNTER — Ambulatory Visit
Admission: EM | Admit: 2021-11-20 | Discharge: 2021-11-20 | Disposition: A | Discharge status: Home / Self Care | Payer: 59 | Attending: Urgent Care | Admitting: Urgent Care

## 2021-11-20 DIAGNOSIS — Z85038 Personal history of other malignant neoplasm of large intestine: Secondary | ICD-10-CM | POA: Diagnosis not present

## 2021-11-20 DIAGNOSIS — U071 COVID-19: Secondary | ICD-10-CM | POA: Diagnosis not present

## 2021-11-20 MED ORDER — NIRMATRELVIR/RITONAVIR (PAXLOVID)TABLET: 3.0000 | ORAL_TABLET | Freq: Two times a day (BID) | ORAL | 0 refills | Status: DC

## 2021-11-20 MED ORDER — NIRMATRELVIR/RITONAVIR (PAXLOVID)TABLET: 3.0000 | ORAL_TABLET | Freq: Two times a day (BID) | ORAL | 0 refills | Status: AC

## 2021-11-20 NOTE — ED Triage Notes (Signed)
Pt tested positive on a home covid test on Friday.  Pt c/o body chills, fatigue, night sweats, sinus pain and congestion x4days.  Pt states that he had a positive covid exposure from work.

## 2021-11-20 NOTE — ED Provider Notes (Signed)
[facility]    CSN: [id number] Arrival date & time: 11/20/21  0803      History   Chief Complaint Chief Complaint  Patient presents with   Covid Positive    HPI Keith Trujillo is a 61 y.o. male.   61 year old male presents today due to concerns of a positive COVID test that he took on Friday evening.  States he started feeling ill Friday morning, and was notified that a coworker was positive with COVID.  His primary symptom was severe fatigue and sinus pressure.  He had some chills, but denies a fever.  Does not have any cardiac or respiratory symptoms.  Denies headache, runny nose, sore throat, ear pain.  He has a history of colon cancer, last chemo treatment was 2021.  He has had COVID in the past, denies any severe complications from it.  No additional concerns today.       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   Special screening for malignant neoplasms, colon    Neoplasm of digestive system     Past Surgical History:  Procedure Laterality Date   COLONOSCOPY WITH PROPOFOL N/A 01/02/2019   Procedure: COLONOSCOPY WITH BIOPSY;  Surgeon: [name], MD;  Location: [place];  Service: Endoscopy;  Laterality: N/A;  Clip x2 at Biopsy Site   COLONOSCOPY WITH PROPOFOL N/A 10/29/2020   Procedure: COLONOSCOPY WITH PROPOFOL;  Surgeon: [name], MD;  Location: [place];  Service: Endoscopy;  Laterality: N/A;   COLOSTOMY Right 02/06/2019   Procedure: COLOSTOMY;  Surgeon: [name], MD;  Location: [facility];  Service: General;  Laterality:  Right;   excision of skin lesion     FLEXIBLE SIGMOIDOSCOPY N/A 10/23/2019   Procedure: FLEXIBLE SIGMOIDOSCOPY WITH BIOPSY and DILATION;  Surgeon: [name], MD;  Location: [place];  Service: Endoscopy;  Laterality: N/A;  needs port accessed   ILEOSTOMY CLOSURE N/A 01/13/2020   Procedure: ILEOSTOMY TAKEDOWN;  Surgeon: [name], MD;  Location: [facility];  Service: General;  Laterality: N/A;   LAPAROSCOPIC SIGMOID COLECTOMY N/A 02/06/2019   Procedure: LAPAROSCOPIC SIGMOID COLECTOMY converted to open procedure;  Surgeon: [name], MD;  Location: [facility];  Service: General;  Laterality: N/A;   POLYPECTOMY N/A 10/23/2019   Procedure: POLYPECTOMY;  Surgeon: [name], MD;  Location: [place];  Service: Endoscopy;  Laterality: N/A;   PORT-A-CATH REMOVAL N/A 01/13/2020   Procedure: REMOVAL PORT-A-CATH;  Surgeon: [name], MD;  Location: [facility];  Service: General;  Laterality: N/A;   PORTACATH PLACEMENT Right 03/06/2019   Procedure: INSERTION PORT-A-CATH;  Surgeon: [name], MD;  Location: [facility];  Service: General;  Laterality: Right;   [place] Medications    Prior to Admission medications   Medication Sig Start Date End Date Taking? Authorizing Provider  cetirizine (ZYRTEC) 10 MG tablet Take 10  mg by mouth daily.   Yes [provider]  loratadine (CLARITIN) 10 MG tablet Take 10 mg by mouth daily.   Yes [provider]  Multiple Vitamin (MULTIVITAMIN) tablet Take 1 tablet by mouth daily. Centrum   Yes [provider]  nirmatrelvir/ritonavir EUA (PAXLOVID) 20 x 150 MG & 10 x '100MG'$  TABS Take 3 tablets by mouth 2 (two) times daily for 5 days. Patient GFR is 60. Take nirmatrelvir (150 mg) two tablets twice daily for 5 days and ritonavir (100 mg) one tablet twice daily for 5 days. 11/20/21 11/25/21  Chaney Malling, PA    Family History Family History  Problem Relation Age of Onset    Lung cancer Father     Social History Social History   Tobacco Use   Smoking status: Light Smoker    Types: Cigars   Smokeless tobacco: Former    Quit date: 1980  Scientific laboratory technician Use: Never used  Substance Use Topics   Alcohol use: Not Currently    Comment: rare   Drug use: Never     Allergies   Sulfa antibiotics   Review of Systems Review of Systems As per HPI  Physical Exam Triage Vital Signs ED Triage Vitals  Enc Vitals Group     BP      Pulse      Resp      Temp      Temp src      SpO2      Weight      Height      Head Circumference      Peak Flow      Pain Score      Pain Loc      Pain Edu?      Excl. in [place]?    No data found.  Updated Vital Signs BP 115/71 (BP Location: Left Arm)   Pulse 73   Temp 99 F (37.2 C) (Oral)   Resp 18   Ht '6\' 4"'$  (1.93 m)   Wt 170 lb (77.1 kg)   SpO2 100%   BMI 20.69 kg/m   Visual Acuity Right Eye Distance:   Left Eye Distance:   Bilateral Distance:    Right Eye Near:   Left Eye Near:    Bilateral Near:     Physical Exam Vitals and nursing note reviewed.  Constitutional:      General: He is not in acute distress.    Appearance: Normal appearance. He is well-developed. He is not ill-appearing, toxic-appearing or diaphoretic.  HENT:     Head: Normocephalic and atraumatic.     Nose:     Comments: Mask in place. No sinus tenderness to palpation Eyes:     Extraocular Movements: Extraocular movements intact.     Conjunctiva/sclera: Conjunctivae normal.     Pupils: Pupils are equal, round, and reactive to light.  Cardiovascular:     Rate and Rhythm: Normal rate and regular rhythm.     Heart sounds: No murmur heard. Pulmonary:     Effort: Pulmonary effort is normal. No respiratory distress.     Breath sounds: Normal breath sounds. No stridor. No wheezing, rhonchi or rales.  Chest:     Chest wall: No tenderness.  Abdominal:     Palpations: Abdomen is soft.     Tenderness: There is no abdominal  tenderness.  Musculoskeletal:        General: No swelling.     Cervical back: Normal range of  motion and neck supple. No rigidity or tenderness.  Lymphadenopathy:     Cervical: No cervical adenopathy.  Skin:    General: Skin is warm and dry.     Capillary Refill: Capillary refill takes less than 2 seconds.     Coloration: Skin is not jaundiced or pale.     Findings: No erythema or rash.  Neurological:     General: No focal deficit present.     Mental Status: He is alert and oriented to person, place, and time.  Psychiatric:        Mood and Affect: Mood normal.      UC Treatments / Results  Labs (all labs ordered are listed, but only abnormal results are displayed) Labs Reviewed - No data to display  EKG   Radiology No results found.  Procedures Procedures (including critical care time)  Medications Ordered in UC Medications - No data to display  Initial Impression / Assessment and Plan / UC Course  I have reviewed the triage vital signs and the nursing notes.  Pertinent labs & imaging results that were available during my care of the patient were reviewed by me and considered in my medical decision making (see chart for details).     Covid -it is not necessary to repeat a COVID test given his known +2 days ago.  Patient is on day 3, therefore antiviral therapy can still be initiated.  Patient has no history of kidney disease, last creatinine normal, GFR greater than 60.  Discussed antiviral therapy, encouraged patient to read attached packet.  Return to clinic/ER precautions discussed.   Final Clinical Impressions(s) / UC Diagnoses   Final diagnoses:  COVID  History of colon cancer     Discharge Instructions      Please take the Paxlovid as prescribed, 3 tablets twice daily for 5 days. Please avoid close contact with other individuals as COVID is contagious. Please perform a 5-day quarantine, and wear an N95 mask for 5 days thereafter. If you develop  severe shortness of breath, chest pain, or fever, please had to the ER.     ED Prescriptions     Medication Sig Dispense Auth. Provider   nirmatrelvir/ritonavir EUA (PAXLOVID) 20 x 150 MG & 10 x '100MG'$  TABS  (Status: Discontinued) Take 3 tablets by mouth 2 (two) times daily for 5 days. Patient GFR is 60. Take nirmatrelvir (150 mg) two tablets twice daily for 5 days and ritonavir (100 mg) one tablet twice daily for 5 days. 30 tablet Lenorris Karger L, PA   nirmatrelvir/ritonavir EUA (PAXLOVID) 20 x 150 MG & 10 x '100MG'$  TABS Take 3 tablets by mouth 2 (two) times daily for 5 days. Patient GFR is 60. Take nirmatrelvir (150 mg) two tablets twice daily for 5 days and ritonavir (100 mg) one tablet twice daily for 5 days. 30 tablet Julieanne Hadsall L, [place]      PDMP not reviewed this encounter.   Chaney Malling, [place] 11/20/21 [phone]

## 2021-11-20 NOTE — Discharge Instructions (Addendum)
Please take the Paxlovid as prescribed, 3 tablets twice daily for 5 days. Please avoid close contact with other individuals as COVID is contagious. Please perform a 5-day quarantine, and wear an N95 mask for 5 days thereafter. If you develop severe shortness of breath, chest pain, or fever, please had to the ER.

## 2021-12-12 ENCOUNTER — Ambulatory Visit: Payer: 59 | Admitting: Oncology

## 2021-12-12 ENCOUNTER — Other Ambulatory Visit: Payer: 59

## 2021-12-13 ENCOUNTER — Encounter: Payer: Self-pay | Admitting: Oncology

## 2021-12-13 ENCOUNTER — Inpatient Hospital Stay: Payer: 59 | Attending: Oncology

## 2021-12-13 ENCOUNTER — Inpatient Hospital Stay (HOSPITAL_BASED_OUTPATIENT_CLINIC_OR_DEPARTMENT_OTHER): Payer: 59 | Admitting: Oncology

## 2021-12-13 VITALS — BP 129/73 | HR 68 | Temp 97.7°F | Resp 16 | Wt 192.0 lb

## 2021-12-13 DIAGNOSIS — Z801 Family history of malignant neoplasm of trachea, bronchus and lung: Secondary | ICD-10-CM | POA: Insufficient documentation

## 2021-12-13 DIAGNOSIS — F17290 Nicotine dependence, other tobacco product, uncomplicated: Secondary | ICD-10-CM | POA: Insufficient documentation

## 2021-12-13 DIAGNOSIS — Z08 Encounter for follow-up examination after completed treatment for malignant neoplasm: Secondary | ICD-10-CM

## 2021-12-13 DIAGNOSIS — Z85038 Personal history of other malignant neoplasm of large intestine: Secondary | ICD-10-CM

## 2021-12-13 DIAGNOSIS — C19 Malignant neoplasm of rectosigmoid junction: Secondary | ICD-10-CM | POA: Diagnosis present

## 2021-12-13 LAB — CBC WITH DIFFERENTIAL/PLATELET
Abs Immature Granulocytes: 0.01 10*3/uL (ref 0.00–0.07)
Basophils Absolute: 0 10*3/uL (ref 0.0–0.1)
Basophils Relative: 0 %
Eosinophils Absolute: 0.1 10*3/uL (ref 0.0–0.5)
Eosinophils Relative: 1 %
HCT: 38.1 % — ABNORMAL LOW (ref 39.0–52.0)
Hemoglobin: 12.8 g/dL — ABNORMAL LOW (ref 13.0–17.0)
Immature Granulocytes: 0 %
Lymphocytes Relative: 18 %
Lymphs Abs: 1.3 10*3/uL (ref 0.7–4.0)
MCH: 32.5 pg (ref 26.0–34.0)
MCHC: 33.6 g/dL (ref 30.0–36.0)
MCV: 96.7 fL (ref 80.0–100.0)
Monocytes Absolute: 0.4 10*3/uL (ref 0.1–1.0)
Monocytes Relative: 6 %
Neutro Abs: 5.5 10*3/uL (ref 1.7–7.7)
Neutrophils Relative %: 75 %
Platelets: 144 10*3/uL — ABNORMAL LOW (ref 150–400)
RBC: 3.94 MIL/uL — ABNORMAL LOW (ref 4.22–5.81)
RDW: 14 % (ref 11.5–15.5)
WBC: 7.3 10*3/uL (ref 4.0–10.5)
nRBC: 0 % (ref 0.0–0.2)

## 2021-12-13 LAB — COMPREHENSIVE METABOLIC PANEL
ALT: 25 U/L (ref 0–44)
AST: 21 U/L (ref 15–41)
Albumin: 4.4 g/dL (ref 3.5–5.0)
Alkaline Phosphatase: 50 U/L (ref 38–126)
Anion gap: 5 (ref 5–15)
BUN: 14 mg/dL (ref 8–23)
CO2: 26 mmol/L (ref 22–32)
Calcium: 9.1 mg/dL (ref 8.9–10.3)
Chloride: 103 mmol/L (ref 98–111)
Creatinine, Ser: 0.85 mg/dL (ref 0.61–1.24)
GFR, Estimated: >60 mL/min (ref >60)
Glucose, Bld: 110 mg/dL — ABNORMAL HIGH (ref 70–99)
Potassium: 3.7 mmol/L (ref 3.5–5.1)
Sodium: 134 mmol/L — ABNORMAL LOW (ref 135–145)
Total Bilirubin: 0.7 mg/dL (ref 0.3–1.2)
Total Protein: 7 g/dL (ref 6.5–8.1)

## 2021-12-14 LAB — CEA: CEA: 1.4 ng/mL (ref 0.0–4.7)

## 2021-12-15 NOTE — Progress Notes (Signed)
Hematology/Oncology Consult note [facility], [place]  [phone] [phone]  Patient Care Team: Kendell Bane, NP as PCP - General (Adult Health Nurse Practitioner) Clent Jacks, RN as Oncology Nurse Navigator Pabon, Marjory Lies, MD as Consulting Physician (General Surgery) Sindy Guadeloupe, MD as Consulting Physician (Oncology)   Name of the patient: Keith Trujillo  [id number]  Jul 29, 1960   Date of visit: 12/15/21  Diagnosis-  stage IIIb colon adenocarcinoma currently in remission   Chief complaint/ Reason for visit- routine f/u of colon cancer  Heme/Onc history: patient is a 61 year old male who was seen by Dr. Allen Norris from GI for symptoms of diarrhea as well as abdominal pain.He underwent a colonoscopy on 01/02/2019 which showed an ulcerated partially obstructing large mass in the sigmoid colon which measured 10 cm in length biopsy showed adenocarcinoma moderately differentiated.  Baseline CEA elevated at 71.7.   CT abdomen pelvis with contrast showed irregular marked circumferential wall thickening in the sigmoid colon near the rectosigmoid junction.  The lesion longitudinally extends for approximately 5 to 6 cm.  4.1 x 2.8 cm ill-defined irregular paracolonic soft tissue mass may represent direct tumor extension or contiguous markedly enlarged lymph node.  Abnormal lymph nodes also seen in the perirectal and presacral space concerning for metastatic disease.  1.5 cm ill-defined hypoattenuating lesion in the medial segment of the left liver which may be focal fatty deposition but cannot be definitively characterized.  MRI did not show any evidence of liver metastases.  CT chest was negative for metastatic disease.   Patient underwent a low anterior resection of the sigmoid mass.  Final pathology showed 8 cm invasive colorectal adenocarcinoma grade 2.  Tumor invades through the muscularis propria into pericolorectal tissue.  All margins are  uninvolvedLymphovascular invasion and perineural invasion present.  Lymph nodes 1+ out of 20 PT3PN1A.  MSI stable   Patient completed 12 cycles of adjuvant FOLFOX chemotherapy on 09/16/2019  Interval history- Patient is doing well. Bowel movements are regular. Denies any  blood loss in stool or urine  ECOG PS- 0 Pain scale- 0   Review of systems- Review of Systems  Constitutional:  Negative for chills, fever, malaise/fatigue and weight loss.  HENT:  Negative for congestion, ear discharge and nosebleeds.   Eyes:  Negative for blurred vision.  Respiratory:  Negative for cough, hemoptysis, sputum production, shortness of breath and wheezing.   Cardiovascular:  Negative for chest pain, palpitations, orthopnea and claudication.  Gastrointestinal:  Negative for abdominal pain, blood in stool, constipation, diarrhea, heartburn, melena, nausea and vomiting.  Genitourinary:  Negative for dysuria, flank pain, frequency, hematuria and urgency.  Musculoskeletal:  Negative for back pain, joint pain and myalgias.  Skin:  Negative for rash.  Neurological:  Negative for dizziness, tingling, focal weakness, seizures, weakness and headaches.  Endo/Heme/Allergies:  Does not bruise/bleed easily.  Psychiatric/Behavioral:  Negative for depression and suicidal ideas. The patient does not have insomnia.       Allergies  Allergen Reactions   Sulfa Antibiotics Rash     Past Medical History:  Diagnosis Date   Abdominal pain    Allergy    Cancer ([place])    Cellulitis    Colon cancer ([place])    Dyspnea    Liver spot    Loose stools    Weight loss      Past Surgical History:  Procedure Laterality Date   COLONOSCOPY WITH PROPOFOL N/A 01/02/2019   Procedure: COLONOSCOPY WITH BIOPSY;  Surgeon:  [name], MD;  Location: [place];  Service: Endoscopy;  Laterality: N/A;  Clip x2 at Biopsy Site   COLONOSCOPY WITH PROPOFOL N/A 10/29/2020   Procedure: COLONOSCOPY WITH PROPOFOL;  Surgeon: [name], MD;  Location: [place];  Service: Endoscopy;  Laterality: N/A;   COLOSTOMY Right 02/06/2019   Procedure: COLOSTOMY;  Surgeon: [name], MD;  Location: [facility];  Service: General;  Laterality: Right;   excision of skin lesion     FLEXIBLE SIGMOIDOSCOPY N/A 10/23/2019   Procedure: FLEXIBLE SIGMOIDOSCOPY WITH BIOPSY and DILATION;  Surgeon: [name], MD;  Location: [place];  Service: Endoscopy;  Laterality: N/A;  needs port accessed   ILEOSTOMY CLOSURE N/A 01/13/2020   Procedure: ILEOSTOMY TAKEDOWN;  Surgeon: [name], MD;  Location: [facility];  Service: General;  Laterality: N/A;   LAPAROSCOPIC SIGMOID COLECTOMY N/A 02/06/2019   Procedure: LAPAROSCOPIC SIGMOID COLECTOMY converted to open procedure;  Surgeon: [name], MD;  Location: [facility];  Service: General;  Laterality: N/A;   POLYPECTOMY N/A 10/23/2019   Procedure: POLYPECTOMY;  Surgeon: [name], MD;  Location: [place];  Service: Endoscopy;  Laterality: N/A;   PORT-A-CATH REMOVAL N/A 01/13/2020   Procedure: REMOVAL PORT-A-CATH;  Surgeon: [name], MD;  Location: [facility];  Service: General;  Laterality: N/A;   PORTACATH PLACEMENT Right 03/06/2019   Procedure: INSERTION PORT-A-CATH;  Surgeon: [name], MD;  Location: [facility];  Service: General;  Laterality: Right;   TONSILLECTOMY AND ADENOIDECTOMY  1969    Social History   Socioeconomic History   Marital status: Married    Spouse name: Not on file   Number of children: Not on file   Years of education: Not on file   Highest education level: Not on file  Occupational History   Not on file  Tobacco Use   Smoking status: Light Smoker    Types: Cigars   Smokeless tobacco: Former    Quit date: 1980  Scientific laboratory technician Use: Never used  Substance and Sexual Activity   Alcohol use: Not Currently    Comment: rare   Drug use: Never   Sexual activity: Yes  Other Topics Concern   Not on file  Social  History Narrative   Not on file   Social Determinants of Health   Financial Resource Strain: Not on file  Food Insecurity: Not on file  Transportation Needs: Not on file  Physical Activity: Not on file  Stress: Not on file  Social Connections: Not on file  Intimate Partner Violence: Not on file    Family History  Problem Relation Age of Onset   Lung cancer Father      Current Outpatient Medications:    loratadine (CLARITIN) 10 MG tablet, Take 10 mg by mouth daily., Disp: , Rfl:    Multiple Vitamin (MULTIVITAMIN) tablet, Take 1 tablet by mouth daily. Centrum, Disp: , Rfl:    cetirizine (ZYRTEC) 10 MG tablet, Take 10 mg by mouth daily. (Patient not taking: Reported on 12/13/2021), Disp: , Rfl:  No current facility-administered medications for this visit.  Facility-Administered Medications Ordered in Other Visits:    sodium chloride flush (NS) 0.9 % injection 10 mL, 10 mL, Intravenous, PRN, Sindy Guadeloupe, MD, 10 mL at 09/02/19 0832  Physical exam:  Vitals:   12/13/21 1345  BP: 129/73  Pulse: 68  Resp: 16  Temp: 97.7 F (36.5 C)  SpO2: 100%  Weight: 192 lb (87.1  kg)   Physical Exam Constitutional:      General: He is not in acute distress. Cardiovascular:     Rate and Rhythm: Normal rate and regular rhythm.     Heart sounds: Normal heart sounds.  Pulmonary:     [place]: Pulmonary effort is normal.     Breath sounds: Normal breath sounds.  Abdominal:     General: Bowel sounds are normal.     Palpations: Abdomen is soft.  Skin:    General: Skin is warm and dry.  Neurological:     Mental Status: He is alert and oriented to person, place, and time.         Latest Ref Rng & Units 12/13/2021    1:27 PM  CMP  Glucose 70 - 99 mg/dL 110   BUN 8 - 23 mg/dL 14   Creatinine 0.61 - 1.24 mg/dL 0.85   Sodium 135 - 145 mmol/L 134   Potassium 3.5 - 5.1 mmol/L 3.7   Chloride 98 - 111 mmol/L 103   CO2 22 - 32 mmol/L 26   Calcium 8.9 - 10.3 mg/dL 9.1   Total Protein 6.5 -  8.1 g/dL 7.0   Total Bilirubin 0.3 - 1.2 mg/dL 0.7   Alkaline Phos 38 - 126 U/L 50   AST 15 - 41 U/L 21   ALT 0 - 44 U/L 25       Latest Ref Rng & Units 12/13/2021    1:27 PM  CBC  WBC 4.0 - 10.5 K/uL 7.3   Hemoglobin 13.0 - 17.0 g/dL 12.8   Hematocrit 39.0 - 52.0 % 38.1   Platelets 150 - 400 K/uL 144     Assessment and plan- Patient is a 61 y.o. 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  He s/p 12 cycles of adjuvant FOLFOX chemotherapy.He is here for routine surveillance visit.  Clinically patient is doing well with no signs and symptoms of recurrence on todays exam. Surveillance colonoscopy was completed on 8/22.   I have reviewed CT images done in jUly 2023 independently. CT scans done in July show no evidence of progressive or metastatic disease. He is getting yearly scans. I will see him back in 6 months with cbc with diff/ CMP and CEA   Visit Diagnosis 1. Encounter for follow-up surveillance of colon cancer      Dr. Randa Evens, MD, MPH [facility] [phone] 12/15/2021 12:32 PM

## 2022-02-02 ENCOUNTER — Encounter: Payer: Self-pay | Admitting: Ophthalmology

## 2022-02-07 NOTE — Discharge Instructions (Signed)

## 2022-02-08 ENCOUNTER — Other Ambulatory Visit: Payer: Self-pay

## 2022-02-08 ENCOUNTER — Encounter
Admission: RE | Disposition: A | Discharge status: Home / Self Care | Payer: Self-pay | Source: Home / Self Care | Attending: Ophthalmology

## 2022-02-08 ENCOUNTER — Ambulatory Visit: Payer: 59 | Admitting: Anesthesiology

## 2022-02-08 ENCOUNTER — Encounter: Payer: Self-pay | Admitting: Ophthalmology

## 2022-02-08 ENCOUNTER — Ambulatory Visit
Admission: RE | Admit: 2022-02-08 | Discharge: 2022-02-08 | Disposition: A | Discharge status: Home / Self Care | Payer: 59 | Attending: Ophthalmology | Admitting: Ophthalmology

## 2022-02-08 DIAGNOSIS — F17290 Nicotine dependence, other tobacco product, uncomplicated: Secondary | ICD-10-CM | POA: Insufficient documentation

## 2022-02-08 DIAGNOSIS — H2512 Age-related nuclear cataract, left eye: Secondary | ICD-10-CM | POA: Diagnosis present

## 2022-02-08 HISTORY — PX: CATARACT EXTRACTION W/PHACO: SHX586

## 2022-02-08 SURGERY — PHACOEMULSIFICATION, CATARACT, WITH IOL INSERTION
Anesthesia: Monitor Anesthesia Care | Site: Eye | Laterality: Left

## 2022-02-08 MED ORDER — LACTATED RINGERS IV SOLN: INTRAVENOUS | Status: DC

## 2022-02-08 MED ORDER — NEOMYCIN-POLYMYXIN-DEXAMETH 3.5-10000-0.1 OP OINT
TOPICAL_OINTMENT | OPHTHALMIC | Status: DC | PRN
  Administered 2022-02-08: 1 via OPHTHALMIC

## 2022-02-08 MED ORDER — SIGHTPATH DOSE#1 BSS IO SOLN
INTRAOCULAR | Status: DC | PRN
  Administered 2022-02-08: 15 mL

## 2022-02-08 MED ORDER — FENTANYL CITRATE (PF) 100 MCG/2ML IJ SOLN
INTRAMUSCULAR | Status: DC | PRN
  Administered 2022-02-08: 100 ug via INTRAVENOUS

## 2022-02-08 MED ORDER — SIGHTPATH DOSE#1 BSS IO SOLN
INTRAOCULAR | Status: DC | PRN
  Administered 2022-02-08: 59 mL via OPHTHALMIC

## 2022-02-08 MED ORDER — BRIMONIDINE TARTRATE-TIMOLOL 0.2-0.5 % OP SOLN
OPHTHALMIC | Status: DC | PRN
  Administered 2022-02-08: 1 [drp] via OPHTHALMIC

## 2022-02-08 MED ORDER — CEFUROXIME OPHTHALMIC INJECTION 1 MG/0.1 ML
INJECTION | OPHTHALMIC | Status: DC | PRN
  Administered 2022-02-08: .1 mL via INTRACAMERAL

## 2022-02-08 MED ORDER — ARMC OPHTHALMIC DILATING DROPS
1.0000 | OPHTHALMIC | Status: DC | PRN
  Administered 2022-02-08 (×3): 1 via OPHTHALMIC

## 2022-02-08 MED ORDER — SIGHTPATH DOSE#1 BSS IO SOLN
INTRAOCULAR | Status: DC | PRN
  Administered 2022-02-08: 1 mL via INTRAMUSCULAR

## 2022-02-08 MED ORDER — TETRACAINE HCL 0.5 % OP SOLN
1.0000 [drp] | OPHTHALMIC | Status: DC | PRN
  Administered 2022-02-08 (×3): 1 [drp] via OPHTHALMIC

## 2022-02-08 MED ORDER — SIGHTPATH DOSE#1 NA HYALUR & NA CHOND-NA HYALUR IO KIT
PACK | INTRAOCULAR | Status: DC | PRN
  Administered 2022-02-08: 1 via OPHTHALMIC

## 2022-02-08 MED ORDER — MIDAZOLAM HCL 2 MG/2ML IJ SOLN
INTRAMUSCULAR | Status: DC | PRN
  Administered 2022-02-08: 2 mg via INTRAVENOUS

## 2022-02-08 SURGICAL SUPPLY — 10 items
CATARACT SUITE SIGHTPATH (MISCELLANEOUS) ×1 IMPLANT
FEE CATARACT SUITE SIGHTPATH (MISCELLANEOUS) ×1 IMPLANT
GLOVE SRG 8 PF TXTR STRL LF DI (GLOVE) ×1 IMPLANT
GLOVE SURG ENC TEXT LTX SZ7.5 (GLOVE) ×1 IMPLANT
GLOVE SURG UNDER POLY LF SZ8 (GLOVE) ×1
LENS IOL TECNIS EYHANCE 17.0 (Intraocular Lens) IMPLANT
NDL FILTER BLUNT 18X1 1/2 (NEEDLE) ×1 IMPLANT
NEEDLE FILTER BLUNT 18X1 1/2 (NEEDLE) ×1 IMPLANT
SYR 3ML LL SCALE MARK (SYRINGE) ×1 IMPLANT
WATER STERILE IRR 250ML POUR (IV SOLUTION) ×1 IMPLANT

## 2022-02-08 NOTE — Transfer of Care (Signed)
Immediate Anesthesia Transfer of Care Note  Patient: Keith Trujillo  Procedure(s) Performed: CATARACT EXTRACTION PHACO AND INTRAOCULAR LENS PLACEMENT (IOC) LEFT  9.40  01:15.0 (Left: Eye)  Patient Location: PACU  Anesthesia Type: MAC  Level of Consciousness: awake, alert  and patient cooperative  Airway and Oxygen Therapy: Patient Spontanous Breathing and Patient connected to supplemental oxygen  Post-op Assessment: Post-op Vital signs reviewed, Patient's Cardiovascular Status Stable, Respiratory Function Stable, Patent Airway and No signs of Nausea or vomiting  Post-op Vital Signs: Reviewed and stable  Complications: No notable events documented.

## 2022-02-08 NOTE — Anesthesia Preprocedure Evaluation (Signed)
Anesthesia Evaluation  Patient identified by MRN, date of birth, ID band Patient awake    Reviewed: Allergy & Precautions, NPO status , Patient's Chart, lab work & pertinent test results  History of Anesthesia Complications Negative for: history of anesthetic complications  Airway Mallampati: II  TM Distance: >3 FB Neck ROM: Full    Dental  (+) Poor Dentition   Pulmonary neg sleep apnea, neg COPD, Current Smoker and Patient abstained from smoking.   breath sounds clear to auscultation- rhonchi (-) wheezing      Cardiovascular Exercise Tolerance: Good (-) hypertension(-) angina (-) CAD, (-) Past MI, (-) Cardiac Stents and (-) CABG (-) dysrhythmias  Rhythm:Regular Rate:Normal - Systolic murmurs and - Diastolic murmurs    Neuro/Psych neg Seizures negative neurological ROS  negative psych ROS   GI/Hepatic negative GI ROS, Neg liver ROS,,,  Endo/Other  negative endocrine ROSneg diabetes    Renal/GU negative Renal ROS     Musculoskeletal negative musculoskeletal ROS (+)    Abdominal  (+) - obese  Peds  Hematology negative hematology ROS (+)   Anesthesia Other Findings Past Medical History: No date: Abdominal pain No date: Allergy No date: Cancer ([place]) No date: Cellulitis No date: Colon cancer ([facility]) No date: Dyspnea No date: Liver spot No date: Loose stools No date: Weight loss   Reproductive/Obstetrics                             Anesthesia Physical Anesthesia Plan  ASA: 2  Anesthesia Plan: MAC   Post-op Pain Management:    Induction: Intravenous  PONV Risk Score and Plan: 0 and Midazolam and Treatment may vary due to age or medical condition  Airway Management Planned: Natural Airway and Nasal Cannula  Additional Equipment:   Intra-op Plan:   Post-operative Plan:   Informed Consent: I have reviewed the patients History and Physical, chart, labs and discussed the procedure  including the risks, benefits and alternatives for the proposed anesthesia with the patient or authorized representative who has indicated his/her understanding and acceptance.     Dental advisory given  Plan Discussed with: CRNA and Anesthesiologist  Anesthesia Plan Comments:         Anesthesia Quick Evaluation

## 2022-02-08 NOTE — H&P (Signed)
[place]   Primary Care Physician:  Kendell Bane, NP Ophthalmologist: Dr. Leandrew Koyanagi  Pre-Procedure History & Physical: HPI:  Keith Trujillo is a 61 y.o. male here for ophthalmic surgery.   Past Medical History:  Diagnosis Date   Abdominal pain    Allergy    Cancer ([place])    Cellulitis    Colon cancer ([facility])    Dyspnea    Liver spot    Loose stools    Weight loss     Past Surgical History:  Procedure Laterality Date   COLONOSCOPY WITH PROPOFOL N/A 01/02/2019   Procedure: COLONOSCOPY WITH BIOPSY;  Surgeon: [name], MD;  Location: [place];  Service: Endoscopy;  Laterality: N/A;  Clip x2 at Biopsy Site   COLONOSCOPY WITH PROPOFOL N/A 10/29/2020   Procedure: COLONOSCOPY WITH PROPOFOL;  Surgeon: [name], MD;  Location: [place];  Service: Endoscopy;  Laterality: N/A;   COLOSTOMY Right 02/06/2019   Procedure: COLOSTOMY;  Surgeon: [name], MD;  Location: [facility];  Service: General;  Laterality: Right;   excision of skin lesion     FLEXIBLE SIGMOIDOSCOPY N/A 10/23/2019   Procedure: FLEXIBLE SIGMOIDOSCOPY WITH BIOPSY and DILATION;  Surgeon: [name], MD;  Location: [place];  Service: Endoscopy;  Laterality: N/A;  needs port accessed   ILEOSTOMY CLOSURE N/A 01/13/2020   Procedure: ILEOSTOMY TAKEDOWN;  Surgeon: [name], MD;  Location: [facility];  Service: General;  Laterality: N/A;   LAPAROSCOPIC SIGMOID COLECTOMY N/A 02/06/2019   Procedure: LAPAROSCOPIC SIGMOID COLECTOMY converted to open procedure;  Surgeon: [name], MD;  Location: [facility];  Service: General;  Laterality: N/A;   POLYPECTOMY N/A 10/23/2019   Procedure: POLYPECTOMY;  Surgeon: [name], MD;  Location: [place];  Service: Endoscopy;  Laterality: N/A;   PORT-A-CATH REMOVAL N/A 01/13/2020   Procedure: REMOVAL PORT-A-CATH;  Surgeon: [name], MD;  Location: [facility];  Service: General;  Laterality: N/A;   PORTACATH  PLACEMENT Right 03/06/2019   Procedure: INSERTION PORT-A-CATH;  Surgeon: [name], MD;  Location: [facility];  Service: General;  Laterality: Right;   [place]    Prior to Admission medications   Medication Sig Start Date End Date Taking? Authorizing Provider  loratadine (CLARITIN) 10 MG tablet Take 10 mg by mouth daily.   Yes [provider]  Multiple Vitamin (MULTIVITAMIN) tablet Take 1 tablet by mouth daily. Centrum   Yes [provider]  Multiple Vitamins-Minerals (PRESERVISION AREDS 2 PO) Take by mouth 2 (two) times daily.   Yes [provider]  cetirizine (ZYRTEC) 10 MG tablet Take 10 mg by mouth daily. Patient not taking: Reported on 12/13/2021    [provider]    Allergies as of 02/01/2022 - Review Complete 12/13/2021  Allergen Reaction Noted   Sulfa antibiotics Rash 04/09/2018    Family History  Problem Relation Age of Onset   Lung cancer Father     Social History   Socioeconomic History   Marital status: Married    Spouse name: Not on file   Number of children: Not on file   Years of education: Not on file   Highest education level: Not on file  Occupational History   Not on file  Tobacco Use   Smoking status: Light Smoker    Types: Cigars   Smokeless tobacco: Former    Quit date: 1980  Scientific laboratory technician Use: Never used  Substance and Sexual Activity   Alcohol use: Not Currently    Comment: rare   Drug use: Never   Sexual activity: Yes  Other Topics Concern   Not on file  Social History Narrative   Not on file   Social Determinants of Health   Financial Resource Strain: Not on file  Food Insecurity: Not on file  Transportation Needs: Not on file  Physical Activity: Not on file  Stress: Not on file  Social Connections: Not on file  Intimate Partner Violence: Not on file    Review of Systems: See HPI, otherwise negative ROS  Physical Exam: BP 135/68   Pulse 64   Temp  97.8 F (36.6 C) (Oral)   Resp 20   Ht '6\' 4"'$  (1.93 m)   Wt 90.1 kg   SpO2 100%   BMI 24.19 kg/m  General:   Alert,  pleasant and cooperative in NAD Head:  Normocephalic and atraumatic. Lungs:  Clear to auscultation.    Heart:  Regular rate and rhythm.   Impression/Plan: Keith Trujillo is here for ophthalmic surgery.  Risks, benefits, limitations, and alternatives regarding ophthalmic surgery have been reviewed with the patient.  Questions have been answered.  All parties agreeable.   Leandrew Koyanagi, MD  02/08/2022, 11:46 AM

## 2022-02-08 NOTE — Anesthesia Postprocedure Evaluation (Signed)
Anesthesia Post Note  Patient: Environmental education officer  Procedure(s) Performed: CATARACT EXTRACTION PHACO AND INTRAOCULAR LENS PLACEMENT (IOC) LEFT  9.40  01:15.0 (Left: Eye)  Patient location during evaluation: PACU Anesthesia Type: MAC Level of consciousness: awake and alert Pain management: pain level controlled Vital Signs Assessment: post-procedure vital signs reviewed and stable Respiratory status: spontaneous breathing, nonlabored ventilation, respiratory function stable and patient connected to nasal cannula oxygen Cardiovascular status: stable and blood pressure returned to baseline Postop Assessment: no apparent nausea or vomiting Anesthetic complications: no   There were no known notable events for this encounter.   Last Vitals:  Vitals:   02/08/22 1135  BP: 135/68  Pulse: 64  Resp: 20  Temp: 36.6 C  SpO2: 100%    Last Pain:  Vitals:   02/08/22 1135  TempSrc: Oral  PainSc: 0-No pain                 Martha Clan

## 2022-02-08 NOTE — Op Note (Signed)
OPERATIVE NOTE  Keith Trujillo [id number] 02/08/2022   PREOPERATIVE DIAGNOSIS:  Nuclear sclerotic cataract left eye. H25.12   POSTOPERATIVE DIAGNOSIS:    Nuclear sclerotic cataract left eye.     PROCEDURE:  Phacoemusification with posterior chamber intraocular lens placement of the left eye  Ultrasound time: Procedure(s): CATARACT EXTRACTION PHACO AND INTRAOCULAR LENS PLACEMENT (IOC) LEFT  9.40  01:15.0 (Left)  LENS:   Implant Name Type Inv. Item Serial No. Manufacturer Lot No. LRB No. Used Action  LENS IOL TECNIS EYHANCE 17.0 - [id number] Intraocular Lens LENS IOL TECNIS EYHANCE 17.0 [id number] [employer]  Left 1 Implanted      SURGEON:  Wyonia Hough, MD   ANESTHESIA:  Topical with tetracaine drops and 2% Xylocaine jelly, augmented with 1% preservative-free intracameral lidocaine.    COMPLICATIONS:  None.   DESCRIPTION OF PROCEDURE:  The patient was identified in the holding room and transported to the operating room and placed in the supine position under the operating microscope.  The left eye was identified as the operative eye and it was prepped and draped in the usual sterile ophthalmic fashion.   A 1 millimeter clear-corneal paracentesis was made at the 1:30 position.  0.5 ml of preservative-free 1% lidocaine was injected into the anterior chamber.  The anterior chamber was filled with Viscoat viscoelastic.  A 2.4 millimeter keratome was used to make a near-clear corneal incision at the 10:30 position.  .  A curvilinear capsulorrhexis was made with a cystotome and capsulorrhexis forceps.  Balanced salt solution was used to hydrodissect and hydrodelineate the nucleus.   Phacoemulsification was then used in stop and chop fashion to remove the lens nucleus and epinucleus.  The remaining cortex was then removed using the irrigation and aspiration handpiece. Provisc was then placed into the capsular bag to distend it for lens placement.  A lens was then injected into the  capsular bag.  The remaining viscoelastic was aspirated.   Wounds were hydrated with balanced salt solution.  The anterior chamber was inflated to a physiologic pressure with balanced salt solution.  No wound leaks were noted. Cefuroxime 0.1 ml of a '10mg'$ /ml solution was injected into the anterior chamber for a dose of 1 mg of intracameral antibiotic at the completion of the case.   Timolol and Brimonidine drops were applied to the eye.  The patient was taken to the recovery room in stable condition without complications of anesthesia or surgery.  [name] 02/08/2022, 12:21 PM

## 2022-05-19 ENCOUNTER — Ambulatory Visit: Payer: 59 | Admitting: Nurse Practitioner

## 2022-05-19 ENCOUNTER — Encounter: Payer: Self-pay | Admitting: Nurse Practitioner

## 2022-05-19 VITALS — BP 138/70 | HR 79 | Temp 97.9°F | Resp 16 | Ht 76.0 in | Wt 217.6 lb

## 2022-05-19 DIAGNOSIS — Z113 Encounter for screening for infections with a predominantly sexual mode of transmission: Secondary | ICD-10-CM

## 2022-05-19 DIAGNOSIS — N5319 Other ejaculatory dysfunction: Secondary | ICD-10-CM | POA: Diagnosis not present

## 2022-05-19 DIAGNOSIS — E782 Mixed hyperlipidemia: Secondary | ICD-10-CM | POA: Diagnosis not present

## 2022-05-19 DIAGNOSIS — Z125 Encounter for screening for malignant neoplasm of prostate: Secondary | ICD-10-CM

## 2022-05-19 NOTE — Progress Notes (Signed)
[facility] [place], [place] [address]  Internal MEDICINE  Office Visit Note  Patient Name: Keith Trujillo  [id number]  [id number]  Date of Service: 05/19/2022   Complaints/HPI Pt is here for establishment of PCP. Chief Complaint  Patient presents with   New Patient (Initial Visit)    HPI Keith Trujillo presents for a new patient visit to establish care.  Well-appearing 62 y.o. male with recent cataract surgery, macular degeneration, and hx of colon cancer.  Was treated with chemo and had a loop ileostomy for about 1 year. Was able to reanastomose the colon.  Work: [employer] Home: live alone but stays with S/O a lot  Diet: figuring it out, was having severe diarrhea during chemo Exercise: minimal, slowly working back up to it Tobacco use: rarely smokes a cigar  Alcohol use: rarely  Illicit drug use: none  Routine CRC screening: every 6 months, had colon cancer dx'd in 2020.  Labs: due for lipid panel, PSA New or worsening pain: none    Current Medication: Outpatient Encounter Medications as of 05/19/2022  Medication Sig   loratadine (CLARITIN) 10 MG tablet Take 10 mg by mouth daily.   Multiple Vitamin (MULTIVITAMIN) tablet Take 1 tablet by mouth daily. Centrum   Multiple Vitamins-Minerals (PRESERVISION AREDS 2 PO) Take by mouth 2 (two) times daily.   cetirizine (ZYRTEC) 10 MG tablet Take 10 mg by mouth daily. (Patient not taking: Reported on 05/19/2022)   Facility-Administered Encounter Medications as of 05/19/2022  Medication   sodium chloride flush (NS) 0.9 % injection 10 mL    Surgical History: Past Surgical History:  Procedure Laterality Date   CATARACT EXTRACTION W/PHACO Left 02/08/2022   Procedure: CATARACT EXTRACTION PHACO AND INTRAOCULAR LENS PLACEMENT (IOC) LEFT  9.40  01:15.0;  Surgeon: Leandrew Koyanagi, MD;  Location: [place];  Service: Ophthalmology;  Laterality: Left;   COLONOSCOPY WITH PROPOFOL  N/A 01/02/2019   Procedure: COLONOSCOPY WITH BIOPSY;  Surgeon: [name], MD;  Location: [place];  Service: Endoscopy;  Laterality: N/A;  Clip x2 at Biopsy Site   COLONOSCOPY WITH PROPOFOL N/A 10/29/2020   Procedure: COLONOSCOPY WITH PROPOFOL;  Surgeon: [name], MD;  Location: [place];  Service: Endoscopy;  Laterality: N/A;   COLOSTOMY Right 02/06/2019   Procedure: COLOSTOMY;  Surgeon: [name], MD;  Location: [facility];  Service: General;  Laterality: Right;   excision of skin lesion     FLEXIBLE SIGMOIDOSCOPY N/A 10/23/2019   Procedure: FLEXIBLE SIGMOIDOSCOPY WITH BIOPSY and DILATION;  Surgeon: [name], MD;  Location: [place];  Service: Endoscopy;  Laterality: N/A;  needs port accessed   ILEOSTOMY CLOSURE N/A 01/13/2020   Procedure: ILEOSTOMY TAKEDOWN;  Surgeon: [name], MD;  Location: [facility];  Service: General;  Laterality: N/A;   LAPAROSCOPIC SIGMOID COLECTOMY N/A 02/06/2019   Procedure: LAPAROSCOPIC SIGMOID COLECTOMY converted to open procedure;  Surgeon: [name], MD;  Location: [facility];  Service: General;  Laterality: N/A;   POLYPECTOMY N/A 10/23/2019   Procedure: POLYPECTOMY;  Surgeon: [name], MD;  Location: [place];  Service: Endoscopy;  Laterality: N/A;   PORT-A-CATH REMOVAL N/A 01/13/2020   Procedure: REMOVAL PORT-A-CATH;  Surgeon: [name], MD;  Location: [facility];  Service: General;  Laterality: N/A;   PORTACATH PLACEMENT Right 03/06/2019   Procedure: INSERTION PORT-A-CATH;  Surgeon: [name], MD;  Location: [facility];  Service: General;  Laterality: Right;   TONSILLECTOMY AND  ADENOIDECTOMY  1969    Medical History: Past Medical History:  Diagnosis Date   Abdominal pain    Allergy    Cancer ([place])    Cellulitis    Colon cancer ([place])    Dyspnea    Liver spot    Loose stools    Weight loss     Family History: Family History  Problem Relation Age of Onset   Lung cancer Father      Social History   Socioeconomic History   Marital status: Married    Spouse name: Not on file   Number of children: Not on file   Years of education: Not on file   Highest education level: Not on file  Occupational History   Not on file  Tobacco Use   Smoking status: Light Smoker    Types: Cigars   Smokeless tobacco: Former    Quit date: 1980  Scientific laboratory technician Use: Never used  Substance and Sexual Activity   Alcohol use: Yes    Comment: occ.   Drug use: Never   Sexual activity: Yes  Other Topics Concern   Not on file  Social History Narrative   Not on file   Social Determinants of Health   Financial Resource Strain: Not on file  Food Insecurity: Not on file  Transportation Needs: Not on file  Physical Activity: Not on file  Stress: Not on file  Social Connections: Not on file  Intimate Partner Violence: Not on file     Review of Systems  Constitutional:  Negative for chills, fatigue and unexpected weight change.  HENT:  Negative for congestion, rhinorrhea, sneezing and sore throat.   Eyes:  Negative for redness.  Respiratory:  Negative for cough, chest tightness and shortness of breath.   Cardiovascular:  Negative for chest pain and palpitations.  Gastrointestinal:  Negative for abdominal pain, constipation, diarrhea, nausea and vomiting.  Genitourinary:  Negative for dysuria and frequency.  Musculoskeletal:  Negative for arthralgias, back pain, joint swelling and neck pain.  Skin:  Negative for rash.  Neurological: Negative.  Negative for tremors and numbness.  Hematological:  Negative for adenopathy. Does not bruise/bleed easily.  Psychiatric/Behavioral:  Negative for behavioral problems (Depression), sleep disturbance and suicidal ideas. The patient is not nervous/anxious.     Vital Signs: BP 138/70 Comment: 158/75  Pulse 79   Temp 97.9 F (36.6 C)   Resp 16   Ht 6\' 4"  (1.93 m)   Wt 217 lb 9.6 oz (98.7 kg)   SpO2 98%   BMI 26.49 kg/m     Physical Exam Vitals reviewed.  Constitutional:      General: He is not in acute distress.    Appearance: Normal appearance. He is obese. He is not ill-appearing.  HENT:     Head: Normocephalic and atraumatic.  Eyes:     Pupils: Pupils are equal, round, and reactive to light.  Cardiovascular:     Rate and Rhythm: Normal rate and regular rhythm.  Pulmonary:     Effort: Pulmonary effort is normal. No respiratory distress.  Neurological:     Mental Status: He is alert and oriented to person, place, and time.  Psychiatric:        Mood and Affect: Mood normal.        Behavior: Behavior normal.       Assessment/Plan: 1. Mixed hyperlipidemia Routine lab ordered - Lipid Profile  2. Abnormal ejaculation Testosterone lab ordered as requested by patient - Raynaldo Opitz  and Total  3. Screening for prostate cancer Routine PSA level ordered - PSA Total (Reflex To Free)  4. Screen for sexually transmitted diseases Routine labs ordered as requested by the patient - STI Profile - Chlamydia/Gonococcus/Trichomonas, NAA - Interpretation:    General Counseling: Keith Trujillo verbalizes understanding of the findings of todays visit and agrees with plan of treatment. I have discussed any further diagnostic evaluation that may be needed or ordered today. We also reviewed his medications today. he has been encouraged to call the office with any questions or concerns that should arise related to todays visit.    Orders Placed This Encounter  Procedures   Chlamydia/Gonococcus/Trichomonas, NAA   STI Profile   Lipid Profile   PSA Total (Reflex To Free)   Testosterone,Free and Total   Interpretation:    No orders of the defined types were placed in this encounter.   Return for CPE, Lj Miyamoto PCP at earliest opening available patient preference, have labs drawn prior to appt .  Time spent:30 Minutes Time spent with patient included reviewing progress notes, labs, imaging studies, and  discussing plan for follow up.   [place] Controlled Substance Database was reviewed by me for overdose risk score (ORS)   This patient was seen by Jonetta Osgood, FNP-C in collaboration with Dr. Clayborn Bigness as a part of collaborative care agreement.   Mostafa Yuan R. Valetta Fuller, MSN, FNP-C Internal Medicine

## 2022-05-24 LAB — CHLAMYDIA/GONOCOCCUS/TRICHOMONAS, NAA
Chlamydia by NAA: NEGATIVE
Gonococcus by NAA: NEGATIVE
Trich vag by NAA: NEGATIVE

## 2022-05-28 LAB — PSA TOTAL (REFLEX TO FREE): Prostate Specific Ag, Serum: 0.5 ng/mL (ref 0.0–4.0)

## 2022-05-28 LAB — HCV INTERPRETATION

## 2022-05-28 LAB — LIPID PANEL
Chol/HDL Ratio: 2 ratio (ref 0.0–5.0)
Cholesterol, Total: 187 mg/dL (ref 100–199)
HDL: 94 mg/dL (ref >39)
LDL Chol Calc (NIH): 84 mg/dL (ref 0–99)
Triglycerides: 45 mg/dL (ref 0–149)
VLDL Cholesterol Cal: 9 mg/dL (ref 5–40)

## 2022-05-28 LAB — TESTOSTERONE,FREE AND TOTAL
Testosterone, Free: 3.5 pg/mL — ABNORMAL LOW (ref 6.6–18.1)
Testosterone: 340 ng/dL (ref 264–916)

## 2022-05-28 LAB — STI PROFILE
HCV Ab: NONREACTIVE
HIV Screen 4th Generation wRfx: NONREACTIVE
Hep B Core Total Ab: NEGATIVE
Hep B Surface Ab, Qual: NONREACTIVE
Hepatitis B Surface Ag: NEGATIVE
RPR Ser Ql: NONREACTIVE

## 2022-06-03 ENCOUNTER — Encounter: Payer: Self-pay | Admitting: Nurse Practitioner

## 2022-06-05 NOTE — Progress Notes (Signed)
Will discuss at upcoming appt.

## 2022-06-07 ENCOUNTER — Encounter: Payer: Self-pay | Admitting: Nurse Practitioner

## 2022-06-07 ENCOUNTER — Ambulatory Visit (INDEPENDENT_AMBULATORY_CARE_PROVIDER_SITE_OTHER): Payer: 59 | Admitting: Nurse Practitioner

## 2022-06-07 VITALS — BP 140/80 | HR 71 | Temp 98.5°F | Resp 16 | Ht 76.0 in | Wt 221.6 lb

## 2022-06-07 DIAGNOSIS — R030 Elevated blood-pressure reading, without diagnosis of hypertension: Secondary | ICD-10-CM | POA: Diagnosis not present

## 2022-06-07 DIAGNOSIS — Z0001 Encounter for general adult medical examination with abnormal findings: Secondary | ICD-10-CM

## 2022-06-07 DIAGNOSIS — R300 Dysuria: Secondary | ICD-10-CM

## 2022-06-07 NOTE — Progress Notes (Signed)
[facility] [place], [place] [address]  Internal MEDICINE  Office Visit Note  Patient Name: Keith Trujillo  [id number]  [id number]  Date of Service: 06/07/2022  Chief Complaint  Patient presents with   Annual Exam    Review labs.     HPI Keith Trujillo presents for an annual well visit and physical exam.  Well-appearing 62 y.o. male with recent cataract surgery, macular degeneration, and hx of colon cancer.  Routine CRC screening: see gastro in 3 years  Labs: reviewed results, everything was normal  New or worsening pain: none  Other concerns: none  BP was elevated, rechecked and improved    Current Medication: Outpatient Encounter Medications as of 06/07/2022  Medication Sig   cetirizine (ZYRTEC) 10 MG tablet Take 10 mg by mouth daily.   loratadine (CLARITIN) 10 MG tablet Take 10 mg by mouth daily.   Multiple Vitamin (MULTIVITAMIN) tablet Take 1 tablet by mouth daily. Centrum   Multiple Vitamins-Minerals (PRESERVISION AREDS 2 PO) Take by mouth 2 (two) times daily.   Facility-Administered Encounter Medications as of 06/07/2022  Medication   sodium chloride flush (NS) 0.9 % injection 10 mL    Surgical History: Past Surgical History:  Procedure Laterality Date   CATARACT EXTRACTION W/PHACO Left 02/08/2022   Procedure: CATARACT EXTRACTION PHACO AND INTRAOCULAR LENS PLACEMENT ([place]) LEFT  9.40  01:15.0;  Surgeon: Leandrew Koyanagi, MD;  Location: [place];  Service: Ophthalmology;  Laterality: Left;   COLONOSCOPY WITH PROPOFOL N/A 01/02/2019   Procedure: COLONOSCOPY WITH BIOPSY;  Surgeon: [name], MD;  Location: [place];  Service: Endoscopy;  Laterality: N/A;  Clip x2 at Biopsy Site   COLONOSCOPY WITH PROPOFOL N/A 10/29/2020   Procedure: COLONOSCOPY WITH PROPOFOL;  Surgeon: [name], MD;  Location: [place];  Service: Endoscopy;  Laterality: N/A;   COLOSTOMY Right 02/06/2019   Procedure: COLOSTOMY;  Surgeon: [name], MD;  Location: [facility];  Service: General;  Laterality: Right;   excision of skin lesion     FLEXIBLE SIGMOIDOSCOPY N/A 10/23/2019   Procedure: FLEXIBLE SIGMOIDOSCOPY WITH BIOPSY and DILATION;  Surgeon: [name], MD;  Location: [place];  Service: Endoscopy;  Laterality: N/A;  needs port accessed   ILEOSTOMY CLOSURE N/A 01/13/2020   Procedure: ILEOSTOMY TAKEDOWN;  Surgeon: [name], MD;  Location: [facility];  Service: General;  Laterality: N/A;   LAPAROSCOPIC SIGMOID COLECTOMY N/A 02/06/2019   Procedure: LAPAROSCOPIC SIGMOID COLECTOMY converted to open procedure;  Surgeon: [name], MD;  Location: [facility];  Service: General;  Laterality: N/A;   POLYPECTOMY N/A 10/23/2019   Procedure: POLYPECTOMY;  Surgeon: [name], MD;  Location: [place];  Service: Endoscopy;  Laterality: N/A;   PORT-A-CATH REMOVAL N/A 01/13/2020   Procedure: REMOVAL PORT-A-CATH;  Surgeon: [name], MD;  Location: [facility];  Service: General;  Laterality: N/A;   PORTACATH PLACEMENT Right 03/06/2019   Procedure: INSERTION PORT-A-CATH;  Surgeon: [name], MD;  Location: [facility];  Service: General;  Laterality: Right;   TONSILLECTOMY AND ADENOIDECTOMY  1969    Medical History: Past Medical History:  Diagnosis Date   Abdominal pain    Allergy    Cancer ([place])    Cellulitis    Colon cancer ([place])    Dyspnea    Liver spot    Loose stools    Weight loss     Family History: Family History  Problem Relation Age of Onset   Lung  cancer Father     Social History   Socioeconomic History   Marital status: Married    Spouse name: Not on file   Number of children: Not on file   Years of education: Not on file   Highest education level: Not on file  Occupational History   Not on file  Tobacco Use   Smoking status: Light Smoker    Types: Cigars   Smokeless tobacco: Former    Quit date: 1980  Vaping Use   Vaping Use: Never used  Substance and Sexual  Activity   Alcohol use: Yes    Comment: occ.   Drug use: Never   Sexual activity: Yes  Other Topics Concern   Not on file  Social History Narrative   Not on file   Social Determinants of Health   Financial Resource Strain: Not on file  Food Insecurity: Not on file  Transportation Needs: Not on file  Physical Activity: Not on file  Stress: Not on file  Social Connections: Not on file  Intimate Partner Violence: Not on file      Review of Systems  Constitutional:  Negative for activity change, appetite change, chills, fatigue, fever and unexpected weight change.  HENT: Negative.  Negative for congestion, ear pain, rhinorrhea, sore throat and trouble swallowing.   Eyes: Negative.   Respiratory: Negative.  Negative for cough, chest tightness, shortness of breath and wheezing.   Cardiovascular: Negative.  Negative for chest pain and palpitations.  Gastrointestinal: Negative.  Negative for abdominal pain, blood in stool, constipation, diarrhea, nausea and vomiting.  Endocrine: Negative.   Genitourinary: Negative.  Negative for difficulty urinating, dysuria, frequency, hematuria and urgency.  Musculoskeletal: Negative.  Negative for arthralgias, back pain, joint swelling, myalgias and neck pain.  Skin: Negative.  Negative for rash and wound.  Allergic/Immunologic: Negative.  Negative for immunocompromised state.  Neurological: Negative.  Negative for dizziness, seizures, numbness and headaches.  Hematological: Negative.   Psychiatric/Behavioral: Negative.  Negative for behavioral problems, self-injury and suicidal ideas. The patient is not nervous/anxious.     Vital Signs: BP (!) 140/80 Comment: 162/82  Pulse 71   Temp 98.5 F (36.9 C)   Resp 16   Ht 6\' 4"  (1.93 m)   Wt 221 lb 9.6 oz (100.5 kg)   SpO2 97%   BMI 26.97 kg/m    Physical Exam Vitals reviewed.  Constitutional:      General: He is awake. He is not in acute distress.    Appearance: Normal appearance. He is  well-developed, well-groomed and overweight. He is not ill-appearing or diaphoretic.  HENT:     Head: Normocephalic and atraumatic.     Right Ear: Tympanic membrane, ear canal and external ear normal. There is no impacted cerumen.     Left Ear: Tympanic membrane, ear canal and external ear normal. There is no impacted cerumen.     Nose: Nose normal. No congestion or rhinorrhea.     Mouth/Throat:     Mouth: Mucous membranes are moist.     Pharynx: Oropharynx is clear. No oropharyngeal exudate or posterior oropharyngeal erythema.  Eyes:     General: Lids are normal. Vision grossly intact. Gaze aligned appropriately. No scleral icterus.       Right eye: No discharge.        Left eye: No discharge.     Extraocular Movements: Extraocular movements intact.     Conjunctiva/sclera: Conjunctivae normal.     Pupils: Pupils are equal, round, and reactive to  light.  Neck:     Thyroid: No thyromegaly.     Vascular: No JVD.     Trachea: Trachea and phonation normal. No tracheal deviation.  Cardiovascular:     Rate and Rhythm: Normal rate and regular rhythm.     Pulses: Normal pulses.     Heart sounds: Normal heart sounds, S1 normal and S2 normal. No murmur heard.    No friction rub. No gallop.  Pulmonary:     Effort: Pulmonary effort is normal. No accessory muscle usage or respiratory distress.     Breath sounds: Normal breath sounds and air entry. No stridor. No wheezing or rales.  Chest:     Chest wall: No tenderness.  Abdominal:     General: Bowel sounds are normal. There is no distension.     Palpations: Abdomen is soft. There is no shifting dullness, fluid wave, mass or pulsatile mass.     Tenderness: There is no abdominal tenderness. There is no guarding or rebound.  Musculoskeletal:        General: No tenderness or deformity. Normal range of motion.     Cervical back: Normal range of motion and neck supple.     Right lower leg: 1+ Pitting Edema present.     Left lower leg: 1+ Pitting  Edema present.  Lymphadenopathy:     Cervical: No cervical adenopathy.  Skin:    General: Skin is warm and dry.     Capillary Refill: Capillary refill takes less than 2 seconds.     Coloration: Skin is not pale.     Findings: No erythema or rash.  Neurological:     Mental Status: He is alert and oriented to person, place, and time.     Cranial Nerves: No cranial nerve deficit.     Motor: No abnormal muscle tone.     Coordination: Coordination normal.     Gait: Gait normal.     Deep Tendon Reflexes: Reflexes are normal and symmetric.  Psychiatric:        Mood and Affect: Mood and affect normal.        Behavior: Behavior normal. Behavior is cooperative.        Thought Content: Thought content normal.        Judgment: Judgment normal.        Assessment/Plan: 1. Encounter for routine adult health examination with abnormal findings Age-appropriate preventive screenings and vaccinations discussed, annual physical exam completed. Routine labs for health maintenance results reviewed with the patient today. PHM updated.   2. Elevated blood pressure reading in office without diagnosis of hypertension Improved when rechecked  3. Dysuria Routine urinalysis done - Urinalysis, Routine w reflex microscopic      General Counseling: [place] verbalizes understanding of the findings of todays visit and agrees with plan of treatment. I have discussed any further diagnostic evaluation that may be needed or ordered today. We also reviewed his medications today. he has been encouraged to call the office with any questions or concerns that should arise related to todays visit.    Orders Placed This Encounter  Procedures   Urinalysis, Routine w reflex microscopic    No orders of the defined types were placed in this encounter.   Return for CPE, [place] PCP and otherwise as needed. .   Total time spent:30 Minutes Time spent includes review of chart, medications, test results, and follow  up plan with the patient.   [place] Controlled Substance Database was reviewed by me.  This patient  was seen by Jonetta Osgood, FNP-C in collaboration with Dr. Clayborn Bigness as a part of collaborative care agreement.  Dixon Luczak R. Valetta Fuller, MSN, FNP-C Internal medicine

## 2022-06-08 LAB — URINALYSIS, ROUTINE W REFLEX MICROSCOPIC
Bilirubin, UA: NEGATIVE
Glucose, UA: NEGATIVE
Ketones, UA: NEGATIVE
Leukocytes,UA: NEGATIVE
Nitrite, UA: NEGATIVE
Protein,UA: NEGATIVE
RBC, UA: NEGATIVE
Specific Gravity, UA: 1.005 (ref 1.005–1.030)
Urobilinogen, Ur: 0.2 mg/dL (ref 0.2–1.0)
pH, UA: 6 (ref 5.0–7.5)

## 2022-06-13 ENCOUNTER — Encounter: Payer: Self-pay | Admitting: Oncology

## 2022-06-13 ENCOUNTER — Inpatient Hospital Stay: Payer: 59

## 2022-06-13 ENCOUNTER — Inpatient Hospital Stay: Payer: 59 | Attending: Oncology | Admitting: Oncology

## 2022-06-13 VITALS — BP 127/81 | HR 67 | Temp 97.3°F | Resp 18 | Ht 76.0 in | Wt 215.4 lb

## 2022-06-13 DIAGNOSIS — Z85038 Personal history of other malignant neoplasm of large intestine: Secondary | ICD-10-CM | POA: Diagnosis not present

## 2022-06-13 DIAGNOSIS — C19 Malignant neoplasm of rectosigmoid junction: Secondary | ICD-10-CM | POA: Diagnosis present

## 2022-06-13 DIAGNOSIS — Z08 Encounter for follow-up examination after completed treatment for malignant neoplasm: Secondary | ICD-10-CM

## 2022-06-13 DIAGNOSIS — F17290 Nicotine dependence, other tobacco product, uncomplicated: Secondary | ICD-10-CM | POA: Insufficient documentation

## 2022-06-13 DIAGNOSIS — D7589 Other specified diseases of blood and blood-forming organs: Secondary | ICD-10-CM | POA: Insufficient documentation

## 2022-06-13 DIAGNOSIS — Z801 Family history of malignant neoplasm of trachea, bronchus and lung: Secondary | ICD-10-CM | POA: Insufficient documentation

## 2022-06-13 LAB — CBC WITH DIFFERENTIAL/PLATELET
Abs Immature Granulocytes: 0.02 10*3/uL (ref 0.00–0.07)
Basophils Absolute: 0 10*3/uL (ref 0.0–0.1)
Basophils Relative: 1 %
Eosinophils Absolute: 0.1 10*3/uL (ref 0.0–0.5)
Eosinophils Relative: 1 %
HCT: 41.3 % (ref 39.0–52.0)
Hemoglobin: 13.4 g/dL (ref 13.0–17.0)
Immature Granulocytes: 0 %
Lymphocytes Relative: 19 %
Lymphs Abs: 1.2 10*3/uL (ref 0.7–4.0)
MCH: 32.5 pg (ref 26.0–34.0)
MCHC: 32.4 g/dL (ref 30.0–36.0)
MCV: 100.2 fL — ABNORMAL HIGH (ref 80.0–100.0)
Monocytes Absolute: 0.4 10*3/uL (ref 0.1–1.0)
Monocytes Relative: 6 %
Neutro Abs: 4.6 10*3/uL (ref 1.7–7.7)
Neutrophils Relative %: 73 %
Platelets: 147 10*3/uL — ABNORMAL LOW (ref 150–400)
RBC: 4.12 MIL/uL — ABNORMAL LOW (ref 4.22–5.81)
RDW: 13.2 % (ref 11.5–15.5)
WBC: 6.2 10*3/uL (ref 4.0–10.5)
nRBC: 0 % (ref 0.0–0.2)

## 2022-06-13 LAB — COMPREHENSIVE METABOLIC PANEL WITH GFR
ALT: 31 U/L (ref 0–44)
AST: 35 U/L (ref 15–41)
Albumin: 4.8 g/dL (ref 3.5–5.0)
Alkaline Phosphatase: 58 U/L (ref 38–126)
Anion gap: 7 (ref 5–15)
BUN: 19 mg/dL (ref 8–23)
CO2: 27 mmol/L (ref 22–32)
Calcium: 9.6 mg/dL (ref 8.9–10.3)
Chloride: 104 mmol/L (ref 98–111)
Creatinine, Ser: 1.04 mg/dL (ref 0.61–1.24)
GFR, Estimated: >60 mL/min
Glucose, Bld: 118 mg/dL — ABNORMAL HIGH (ref 70–99)
Potassium: 4.5 mmol/L (ref 3.5–5.1)
Sodium: 138 mmol/L (ref 135–145)
Total Bilirubin: 0.7 mg/dL (ref 0.3–1.2)
Total Protein: 7.4 g/dL (ref 6.5–8.1)

## 2022-06-15 LAB — CEA: CEA: 2.4 ng/mL (ref 0.0–4.7)

## 2022-06-18 NOTE — Progress Notes (Signed)
Hematology/Oncology Consult note [facility]  Telephone:([phone] [phone]  Patient Care Team: Jonetta Osgood, NP as PCP - General (Nurse Practitioner) Clent Jacks, RN as Oncology Nurse Navigator Pabon, Marjory Lies, MD as Consulting Physician (General Surgery) Sindy Guadeloupe, MD as Consulting Physician (Oncology)   Name of the patient: Keith Trujillo  [id number]  1960/09/14   Date of visit: 06/18/22  Diagnosis-  stage IIIb colon adenocarcinoma currently in remission   Chief complaint/ Reason for visit- routine f/u of colon cancer  Heme/Onc history: patient is a 62 year old male who was seen by Dr. Allen Norris from GI for symptoms of diarrhea as well as abdominal pain.He underwent a colonoscopy on 01/02/2019 which showed an ulcerated partially obstructing large mass in the sigmoid colon which measured 10 cm in length biopsy showed adenocarcinoma moderately differentiated.  Baseline CEA elevated at 71.7.   CT abdomen pelvis with contrast showed irregular marked circumferential wall thickening in the sigmoid colon near the rectosigmoid junction.  The lesion longitudinally extends for approximately 5 to 6 cm.  4.1 x 2.8 cm ill-defined irregular paracolonic soft tissue mass may represent direct tumor extension or contiguous markedly enlarged lymph node.  Abnormal lymph nodes also seen in the perirectal and presacral space concerning for metastatic disease.  1.5 cm ill-defined hypoattenuating lesion in the medial segment of the left liver which may be focal fatty deposition but cannot be definitively characterized.  MRI did not show any evidence of liver metastases.  CT chest was negative for metastatic disease.   Patient underwent a low anterior resection of the sigmoid mass.  Final pathology showed 8 cm invasive colorectal adenocarcinoma grade 2.  Tumor invades through the muscularis propria into pericolorectal tissue.  All margins are uninvolvedLymphovascular  invasion and perineural invasion present.  Lymph nodes 1+ out of 20 PT3PN1A.  MSI stable   Patient completed 12 cycles of adjuvant FOLFOX chemotherapy on 09/16/2019  Interval history- patient is doing well. His bowel movements are regular. Denies any blood in stool or dark melanotic stools  ECOG PS- 0 Pain scale- 0   Review of systems- Review of Systems  Constitutional:  Negative for chills, fever, malaise/fatigue and weight loss.  HENT:  Negative for congestion, ear discharge and nosebleeds.   Eyes:  Negative for blurred vision.  Respiratory:  Negative for cough, hemoptysis, sputum production, shortness of breath and wheezing.   Cardiovascular:  Negative for chest pain, palpitations, orthopnea and claudication.  Gastrointestinal:  Negative for abdominal pain, blood in stool, constipation, diarrhea, heartburn, melena, nausea and vomiting.  Genitourinary:  Negative for dysuria, flank pain, frequency, hematuria and urgency.  Musculoskeletal:  Negative for back pain, joint pain and myalgias.  Skin:  Negative for rash.  Neurological:  Negative for dizziness, tingling, focal weakness, seizures, weakness and headaches.  Endo/Heme/Allergies:  Does not bruise/bleed easily.  Psychiatric/Behavioral:  Negative for depression and suicidal ideas. The patient does not have insomnia.       Allergies  Allergen Reactions   Sulfa Antibiotics Rash     Past Medical History:  Diagnosis Date   Abdominal pain    Allergy    Cancer ([place])    Cellulitis    Colon cancer ([place])    Dyspnea    Liver spot    Loose stools    Weight loss      Past Surgical History:  Procedure Laterality Date   CATARACT EXTRACTION W/PHACO Left 02/08/2022   Procedure: CATARACT EXTRACTION PHACO AND INTRAOCULAR LENS PLACEMENT (  IOC) LEFT  9.40  01:15.0;  Surgeon: Leandrew Koyanagi, MD;  Location: [place];  Service: Ophthalmology;  Laterality: Left;   COLONOSCOPY WITH PROPOFOL N/A 01/02/2019   Procedure:  COLONOSCOPY WITH BIOPSY;  Surgeon: [name], MD;  Location: [place];  Service: Endoscopy;  Laterality: N/A;  Clip x2 at Biopsy Site   COLONOSCOPY WITH PROPOFOL N/A 10/29/2020   Procedure: COLONOSCOPY WITH PROPOFOL;  Surgeon: [name], MD;  Location: [place];  Service: Endoscopy;  Laterality: N/A;   COLOSTOMY Right 02/06/2019   Procedure: COLOSTOMY;  Surgeon: [name], MD;  Location: [facility];  Service: General;  Laterality: Right;   excision of skin lesion     FLEXIBLE SIGMOIDOSCOPY N/A 10/23/2019   Procedure: FLEXIBLE SIGMOIDOSCOPY WITH BIOPSY and DILATION;  Surgeon: [name], MD;  Location: [place];  Service: Endoscopy;  Laterality: N/A;  needs port accessed   ILEOSTOMY CLOSURE N/A 01/13/2020   Procedure: ILEOSTOMY TAKEDOWN;  Surgeon: [name], MD;  Location: [facility];  Service: General;  Laterality: N/A;   LAPAROSCOPIC SIGMOID COLECTOMY N/A 02/06/2019   Procedure: LAPAROSCOPIC SIGMOID COLECTOMY converted to open procedure;  Surgeon: [name], MD;  Location: [facility];  Service: General;  Laterality: N/A;   POLYPECTOMY N/A 10/23/2019   Procedure: POLYPECTOMY;  Surgeon: [name], MD;  Location: [place];  Service: Endoscopy;  Laterality: N/A;   PORT-A-CATH REMOVAL N/A 01/13/2020   Procedure: REMOVAL PORT-A-CATH;  Surgeon: [name], MD;  Location: [facility];  Service: General;  Laterality: N/A;   PORTACATH PLACEMENT Right 03/06/2019   Procedure: INSERTION PORT-A-CATH;  Surgeon: [name], MD;  Location: [facility];  Service: General;  Laterality: Right;   TONSILLECTOMY AND ADENOIDECTOMY  1969    Social History   Socioeconomic History   Marital status: Married    Spouse name: Not on file   Number of children: Not on file   Years of education: Not on file   Highest education level: Not on file  Occupational History   Not on file  Tobacco Use   Smoking status: Light Smoker    Types: Cigars   Smokeless  tobacco: Former    Quit date: 1980  Vaping Use   Vaping Use: Never used  Substance and Sexual Activity   Alcohol use: Yes    Comment: occ.   Drug use: Never   Sexual activity: Yes  Other Topics Concern   Not on file  Social History Narrative   Not on file   Social Determinants of Health   Financial Resource Strain: Not on file  Food Insecurity: Not on file  Transportation Needs: Not on file  Physical Activity: Not on file  Stress: Not on file  Social Connections: Not on file  Intimate Partner Violence: Not on file    Family History  Problem Relation Age of Onset   Lung cancer Father      Current Outpatient Medications:    cetirizine (ZYRTEC) 10 MG tablet, Take 10 mg by mouth daily., Disp: , Rfl:    loratadine (CLARITIN) 10 MG tablet, Take 10 mg by mouth daily., Disp: , Rfl:    Multiple Vitamin (MULTIVITAMIN) tablet, Take 1 tablet by mouth daily. Centrum, Disp: , Rfl:    Multiple Vitamins-Minerals (PRESERVISION AREDS 2 PO), Take by mouth 2 (two) times daily., Disp: , Rfl:  No current facility-administered medications for this visit.  Facility-Administered Medications Ordered in Other Visits:    sodium chloride flush (NS) 0.9 %  injection 10 mL, 10 mL, Intravenous, PRN, Sindy Guadeloupe, MD, 10 mL at 09/02/19 0832  Physical exam:  Vitals:   06/13/22 1402 06/13/22 1407  BP: (!) 140/72 127/81  Pulse: 69 67  Resp: 18   Temp: (!) 97.3 F (36.3 C)   TempSrc: Tympanic   SpO2: 100%   Weight: 215 lb 6.4 oz (97.7 kg)   Height: 6\' 4"  (1.93 m)    Physical Exam Cardiovascular:     Rate and Rhythm: Normal rate and regular rhythm.     Heart sounds: Normal heart sounds.  Pulmonary:     Effort: Pulmonary effort is normal.     Breath sounds: Normal breath sounds.  Abdominal:     General: Bowel sounds are normal.     Palpations: Abdomen is soft.  Skin:    General: Skin is warm and dry.  Neurological:     Mental Status: He is alert and oriented to person, place, and time.          Latest Ref Rng & Units 06/13/2022    1:33 PM  CMP  Glucose 70 - 99 mg/dL 118   BUN 8 - 23 mg/dL 19   Creatinine 0.61 - 1.24 mg/dL 1.04   Sodium 135 - 145 mmol/L 138   Potassium 3.5 - 5.1 mmol/L 4.5   Chloride 98 - 111 mmol/L 104   CO2 22 - 32 mmol/L 27   Calcium 8.9 - 10.3 mg/dL 9.6   Total Protein 6.5 - 8.1 g/dL 7.4   Total Bilirubin 0.3 - 1.2 mg/dL 0.7   Alkaline Phos 38 - 126 U/L 58   AST 15 - 41 U/L 35   ALT 0 - 44 U/L 31       Latest Ref Rng & Units 06/13/2022    1:33 PM  CBC  WBC 4.0 - 10.5 K/uL 6.2   Hemoglobin 13.0 - 17.0 g/dL 13.4   Hematocrit 39.0 - 52.0 % 41.3   Platelets 150 - 400 K/uL 147      Assessment and plan- Patient is a 62 y.o. 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  He s/p 12 cycles of adjuvant FOLFOX chemotherapy. This is a routine surveillance visit  Clinically patient is doing well with no concerning signs and symptoms of recurrence based on today's exam.  He is up-to-date with his surveillance colonoscopies.  He will be due for repeatChest abdomen pelvis with contrast in July 2024 which I will schedule.  I will see him back in 6 months with labs   He has mild macrocytosis noted on his labs today and I will be checking B12 and TSH as well during his next labs   Visit Diagnosis 1. Encounter for follow-up surveillance of colon cancer      Dr. Randa Evens, MD, MPH [facility] [id number] 06/18/2022 7:56 PM

## 2022-06-28 ENCOUNTER — Other Ambulatory Visit: Payer: Self-pay | Admitting: *Deleted

## 2022-06-28 DIAGNOSIS — C187 Malignant neoplasm of sigmoid colon: Secondary | ICD-10-CM

## 2022-08-29 ENCOUNTER — Other Ambulatory Visit: Payer: Self-pay | Admitting: Internal Medicine

## 2022-08-29 DIAGNOSIS — G8929 Other chronic pain: Secondary | ICD-10-CM

## 2022-08-29 DIAGNOSIS — R109 Unspecified abdominal pain: Secondary | ICD-10-CM

## 2022-08-29 DIAGNOSIS — K529 Noninfective gastroenteritis and colitis, unspecified: Secondary | ICD-10-CM

## 2022-09-15 ENCOUNTER — Encounter: Payer: Self-pay | Admitting: Emergency Medicine

## 2022-09-15 ENCOUNTER — Encounter: Payer: Self-pay | Admitting: Nurse Practitioner

## 2022-09-15 ENCOUNTER — Ambulatory Visit: Payer: 59 | Admitting: Nurse Practitioner

## 2022-09-15 ENCOUNTER — Emergency Department
Admission: EM | Admit: 2022-09-15 | Discharge: 2022-09-15 | Disposition: A | Discharge status: Home / Self Care | Payer: 59 | Attending: Emergency Medicine | Admitting: Emergency Medicine

## 2022-09-15 ENCOUNTER — Other Ambulatory Visit: Payer: Self-pay

## 2022-09-15 VITALS — BP 138/70 | HR 121 | Temp 98.7°F | Resp 16 | Ht 76.0 in | Wt 225.6 lb

## 2022-09-15 DIAGNOSIS — I483 Typical atrial flutter: Secondary | ICD-10-CM

## 2022-09-15 DIAGNOSIS — I4892 Unspecified atrial flutter: Secondary | ICD-10-CM | POA: Diagnosis present

## 2022-09-15 HISTORY — DX: Unspecified atrial flutter: I48.92

## 2022-09-15 LAB — CBC WITH DIFFERENTIAL/PLATELET
Abs Immature Granulocytes: 0.03 10*3/uL (ref 0.00–0.07)
Basophils Absolute: 0.1 10*3/uL (ref 0.0–0.1)
Basophils Relative: 1 %
Eosinophils Absolute: 0.2 10*3/uL (ref 0.0–0.5)
Eosinophils Relative: 2 %
HCT: 49.6 % (ref 39.0–52.0)
Hemoglobin: 16.3 g/dL (ref 13.0–17.0)
Immature Granulocytes: 0 %
Lymphocytes Relative: 23 %
Lymphs Abs: 1.9 10*3/uL (ref 0.7–4.0)
MCH: 32 pg (ref 26.0–34.0)
MCHC: 32.9 g/dL (ref 30.0–36.0)
MCV: 97.3 fL (ref 80.0–100.0)
Monocytes Absolute: 0.5 10*3/uL (ref 0.1–1.0)
Monocytes Relative: 5 %
Neutro Abs: 5.8 10*3/uL (ref 1.7–7.7)
Neutrophils Relative %: 69 %
Platelets: 162 10*3/uL (ref 150–400)
RBC: 5.1 MIL/uL (ref 4.22–5.81)
RDW: 13.2 % (ref 11.5–15.5)
WBC: 8.4 10*3/uL (ref 4.0–10.5)
nRBC: 0 % (ref 0.0–0.2)

## 2022-09-15 LAB — COMPREHENSIVE METABOLIC PANEL
ALT: 30 U/L (ref 0–44)
AST: 28 U/L (ref 15–41)
Albumin: 4.9 g/dL (ref 3.5–5.0)
Alkaline Phosphatase: 61 U/L (ref 38–126)
Anion gap: 12 (ref 5–15)
BUN: 22 mg/dL (ref 8–23)
CO2: 24 mmol/L (ref 22–32)
Calcium: 9.7 mg/dL (ref 8.9–10.3)
Chloride: 104 mmol/L (ref 98–111)
Creatinine, Ser: 0.97 mg/dL (ref 0.61–1.24)
GFR, Estimated: >60 mL/min (ref >60)
Glucose, Bld: 142 mg/dL — ABNORMAL HIGH (ref 70–99)
Potassium: 3.4 mmol/L — ABNORMAL LOW (ref 3.5–5.1)
Sodium: 140 mmol/L (ref 135–145)
Total Bilirubin: 0.8 mg/dL (ref 0.3–1.2)
Total Protein: 7.7 g/dL (ref 6.5–8.1)

## 2022-09-15 LAB — TROPONIN I (HIGH SENSITIVITY): Troponin I (High Sensitivity): 11 ng/L (ref <18)

## 2022-09-15 MED ORDER — DILTIAZEM HCL 30 MG PO TABS: 30.0000 mg | ORAL_TABLET | Freq: Three times a day (TID) | ORAL | 1 refills | Status: AC | PRN

## 2022-09-15 MED ORDER — DILTIAZEM HCL 25 MG/5ML IV SOLN
15.0000 mg | Freq: Once | INTRAVENOUS | Status: AC
  Administered 2022-09-15: 15 mg via INTRAVENOUS
  Filled 2022-09-15: qty 5

## 2022-09-15 NOTE — Progress Notes (Signed)
[facility] [address] [place], [place] [address]  Internal MEDICINE  Office Visit Note  Patient Name: Keith Trujillo  [id number]  [id number]  Date of Service: 09/15/2022  Chief Complaint  Patient presents with   Acute Visit    HPI Keith Trujillo presents for a follow-up visit for atrial flutter Went to the ER on 07/09/22 for atrial flutter.  Followed up with [facility] cardiology in 4/23 and 5/7 Wants to be referred to a cardiologist in town because [facility] cardiology is too far away.      Current Medication: Outpatient Encounter Medications as of 09/15/2022  Medication Sig   cetirizine (ZYRTEC) 10 MG tablet Take 10 mg by mouth daily.   loratadine (CLARITIN) 10 MG tablet Take 10 mg by mouth daily.   Multiple Vitamin (MULTIVITAMIN) tablet Take 1 tablet by mouth daily. Centrum   Multiple Vitamins-Minerals (PRESERVISION AREDS 2 PO) Take by mouth 2 (two) times daily.   [DISCONTINUED] diltiazem (CARDIZEM) 30 MG tablet Take by mouth.   diltiazem (CARDIZEM) 30 MG tablet Take 1 tablet (30 mg total) by mouth 3 (three) times daily as needed (palpitations/arrhythmia).   Facility-Administered Encounter Medications as of 09/15/2022  Medication   sodium chloride flush (NS) 0.9 % injection 10 mL    Surgical History: Past Surgical History:  Procedure Laterality Date   CATARACT EXTRACTION W/PHACO Left 02/08/2022   Procedure: CATARACT EXTRACTION PHACO AND INTRAOCULAR LENS PLACEMENT (IOC) LEFT  9.40  01:15.0;  Surgeon: Lockie Mola, MD;  Location: [facility];  Service: Ophthalmology;  Laterality: Left;   COLONOSCOPY WITH PROPOFOL N/A 01/02/2019   Procedure: COLONOSCOPY WITH BIOPSY;  Surgeon: Midge Minium, MD;  Location: [facility];  Service: Endoscopy;  Laterality: N/A;  Clip x2 at Biopsy Site   COLONOSCOPY WITH PROPOFOL N/A 10/29/2020   Procedure: COLONOSCOPY WITH PROPOFOL;  Surgeon: Midge Minium, MD;  Location: [facility];  Service: Endoscopy;  Laterality: N/A;    COLOSTOMY Right 02/06/2019   Procedure: COLOSTOMY;  Surgeon: Leafy Ro, MD;  Location: [facility];  Service: General;  Laterality: Right;   excision of skin lesion     FLEXIBLE SIGMOIDOSCOPY N/A 10/23/2019   Procedure: FLEXIBLE SIGMOIDOSCOPY WITH BIOPSY and DILATION;  Surgeon: Midge Minium, MD;  Location: [facility];  Service: Endoscopy;  Laterality: N/A;  needs port accessed   ILEOSTOMY CLOSURE N/A 01/13/2020   Procedure: ILEOSTOMY TAKEDOWN;  Surgeon: Leafy Ro, MD;  Location: [facility];  Service: General;  Laterality: N/A;   LAPAROSCOPIC SIGMOID COLECTOMY N/A 02/06/2019   Procedure: LAPAROSCOPIC SIGMOID COLECTOMY converted to open procedure;  Surgeon: Leafy Ro, MD;  Location: [facility];  Service: General;  Laterality: N/A;   POLYPECTOMY N/A 10/23/2019   Procedure: POLYPECTOMY;  Surgeon: Midge Minium, MD;  Location[facility];  Service: Endoscopy;  Laterality: N/A;   PORT-A-CATH REMOVAL N/A 01/13/2020   Procedure: REMOVAL PORT-A-CATH;  Surgeon: Leafy Ro, MD;  Location: [facility];  Service: General;  Laterality: N/A;   PORTACATH PLACEMENT Right 03/06/2019   Procedure: INSERTION PORT-A-CATH;  Surgeon: Leafy Ro, MD;  Location: [facility];  Service: General;  Laterality: Right;   TONSILLECTOMY AND ADENOIDECTOMY  1969    Medical History: Past Medical History:  Diagnosis Date   Abdominal pain    Allergy    Cancer ([facility])    Cellulitis    Colon cancer ([facility])    Dyspnea    Liver spot    Loose stools    Weight loss  Family History: Family History  Problem Relation Age of Onset   Lung cancer Father     Social History   Socioeconomic History   Marital status: Married    Spouse name: Not on file   Number of children: Not on file   Years of education: Not on file   Highest education level: Not on file  Occupational History   Not on file  Tobacco Use   Smoking status: Former    Types: Cigars   Smokeless tobacco: Former    Quit date: 1980   Building services engineer Use: Never used  Substance and Sexual Activity   Alcohol use: Yes    Comment: occ.   Drug use: Never   Sexual activity: Yes  Other Topics Concern   Not on file  Social History Narrative   Not on file   Social Determinants of Health   Financial Resource Strain: Not on file  Food Insecurity: Not on file  Transportation Needs: Not on file  Physical Activity: Not on file  Stress: Not on file  Social Connections: Not on file  Intimate Partner Violence: Not on file      Review of Systems  Constitutional:  Positive for fatigue.  Respiratory:  Positive for shortness of breath. Negative for cough, chest tightness and wheezing.   Cardiovascular:  Positive for palpitations. Negative for chest pain.  Gastrointestinal: Negative.   Musculoskeletal: Negative.   Neurological: Negative.     Vital Signs: BP 138/70 Comment: 144/72  Pulse (!) 121   Temp 98.7 F (37.1 C)   Resp 16   Ht 6\' 4"  (1.93 m)   Wt 225 lb 9.6 oz (102.3 kg)   SpO2 97%   BMI 27.46 kg/m    Physical Exam Vitals reviewed.  Constitutional:      General: He is not in acute distress.    Appearance: Normal appearance. He is not ill-appearing.  HENT:     Head: Normocephalic and atraumatic.  Eyes:     Pupils: Pupils are equal, round, and reactive to light.  Cardiovascular:     Rate and Rhythm: Tachycardia present. Rhythm irregularly irregular.     Heart sounds: Murmur heard.  Pulmonary:     Effort: Pulmonary effort is normal. No respiratory distress.  Neurological:     Mental Status: He is alert and oriented to person, place, and time.  Psychiatric:        Mood and Affect: Mood normal.        Behavior: Behavior normal.        Assessment/Plan: 1. Typical atrial flutter (HCC) Referred to cardiology. Refilled diltiazem, take as needed when having palpitations.  - Ambulatory referral to Cardiology - diltiazem (CARDIZEM) 30 MG tablet; Take 1 tablet (30 mg total) by mouth 3 (three)  times daily as needed (palpitations/arrhythmia).  Dispense: 60 tablet; Refill: 1   General Counseling: Kyuss verbalizes understanding of the findings of todays visit and agrees with plan of treatment. I have discussed any further diagnostic evaluation that may be needed or ordered today. We also reviewed his medications today. he has been encouraged to call the office with any questions or concerns that should arise related to todays visit.    Orders Placed This Encounter  Procedures   Ambulatory referral to Cardiology    Meds ordered this encounter  Medications   diltiazem (CARDIZEM) 30 MG tablet    Sig: Take 1 tablet (30 mg total) by mouth 3 (three) times daily as needed (palpitations/arrhythmia).  Dispense:  60 tablet    Refill:  1    Fill new script today asap.    Return if symptoms worsen or fail to improve.   Total time spent:30 Minutes Time spent includes review of chart, medications, test results, and follow up plan with the patient.   [place] Controlled Substance Database was reviewed by me.  This patient was seen by Sallyanne Kuster, FNP-C in collaboration with Dr. Beverely Risen as a part of collaborative care agreement.   Kamea Dacosta R. Tedd Sias, MSN, FNP-C Internal medicine

## 2022-09-15 NOTE — ED Provider Notes (Signed)
[facility] Provider Note    Event Date/Time   First MD Initiated Contact with Patient 09/15/22 2007     (approximate)   History   Atrial Flutter   HPI  Keith Trujillo is a 62 y.o. male who presented to the emergency department today because of concerns for fast heart rate.  The patient was recently diagnosed with atrial flutter.  The patient says that when he started feeling his heart rate go fast today he took one of the diltiazem tablets that was given to him.  However this did not help.  He then tried again without any relief.  Patient can sense that his heart rate is going fast with some abnormal sensation in his chest.  He does state that he had very little sleep last night.     Physical Exam   Triage Vital Signs: ED Triage Vitals  Enc Vitals Group     BP 09/15/22 2002 (!) 155/136     Pulse Rate 09/15/22 2002 (!) 138     Resp 09/15/22 2002 18     Temp --      Temp src --      SpO2 09/15/22 2002 97 %     Weight 09/15/22 2001 225 lb 8.5 oz (102.3 kg)     Height 09/15/22 2001 6\' 4"  (1.93 m)     Head Circumference --      Peak Flow --      Pain Score 09/15/22 2000 0     Pain Loc --      Pain Edu? --      Excl. in GC? --     Most recent vital signs: Vitals:   09/15/22 2002  BP: (!) 155/136  Pulse: (!) 138  Resp: 18  SpO2: 97%   General: Awake, alert, oriented. CV:  Good peripheral perfusion. Irregular rhythm, tachycardia. Resp:  Normal effort. Lungs clear. Abd:  No distention.    ED Results / Procedures / Treatments   Labs (all labs ordered are listed, but only abnormal results are displayed) Labs Reviewed  COMPREHENSIVE METABOLIC PANEL - Abnormal; Notable for the following components:      Result Value   Potassium 3.4 (*)    Glucose, Bld 142 (*)    All other components within normal limits  CBC WITH DIFFERENTIAL/PLATELET  TROPONIN I (HIGH SENSITIVITY)     EKG  I, Phineas Semen, attending physician, personally viewed  and interpreted this EKG  EKG Time: 2003 Rate: 139 Rhythm: atrial flutter Axis: rightward axis Intervals: qtc 471 QRS: narrow, q waves v1 ST changes: no st elevation Impression: abnormal ekg   RADIOLOGY None   PROCEDURES:  Critical Care performed: No    MEDICATIONS ORDERED IN ED: Medications - No data to display   IMPRESSION / MDM / ASSESSMENT AND PLAN / ED COURSE  I reviewed the triage vital signs and the nursing notes.                              Differential diagnosis includes, but is not limited to, atrial flutter, atrial fibrillation, electrolyte abnormality, heart damage  Patient's presentation is most consistent with acute presentation with potential threat to life or bodily function.   The patient is on the cardiac monitor to evaluate for evidence of arrhythmia and/or significant heart rate changes.  Patient presented to the emergency department today because of concerns for fast heart rate.  Initial EKG is  consistent with atrial flutter.  Patient was given diltiazem which did improve his rate into the 70s.  Patient did feel better.  Blood work without concerning electrolyte abnormality or elevated troponin.  Patient's CHA2DS2-VASc score is 0.  Did discuss with patient portance of follow-up with cardiologist.      FINAL CLINICAL IMPRESSION(S) / ED DIAGNOSES   Final diagnoses:  Atrial flutter, unspecified type [facility])    Note:  This document was prepared using Dragon voice recognition software and may include unintentional dictation errors.    Phineas Semen, MD 09/15/22 [phone]

## 2022-09-15 NOTE — ED Notes (Signed)
Pt A&O x4, no obvious distress noted, respirations regular/unlabored. Pt verbalizes understanding of discharge instructions. Pt able to ambulate from ED independently.   

## 2022-09-15 NOTE — ED Triage Notes (Signed)
Patient reports recent diagnosis of atrial flutter.  Patient reports that today, he has not been able to get his heart rate under 140 despite taking cardizem. Patient denies chest pain/shob.

## 2022-09-15 NOTE — Discharge Instructions (Signed)
Please seek medical attention for any high fevers, chest pain, shortness of breath, change in behavior, persistent vomiting, bloody stool or any other new or concerning symptoms.  

## 2022-09-18 ENCOUNTER — Ambulatory Visit: Payer: No Typology Code available for payment source | Admitting: Nurse Practitioner

## 2022-09-27 ENCOUNTER — Encounter: Payer: Self-pay | Admitting: Nurse Practitioner

## 2022-09-27 ENCOUNTER — Telehealth: Payer: Self-pay | Admitting: Nurse Practitioner

## 2022-09-27 DIAGNOSIS — I483 Typical atrial flutter: Secondary | ICD-10-CM

## 2022-09-27 NOTE — Telephone Encounter (Signed)
Patient unable to get appointment with cardiologist until September. Redirected referral to Dr. Adrian Blackwater; [phone]. Lvm notifying patient-Toni

## 2022-10-02 ENCOUNTER — Ambulatory Visit
Admission: RE | Admit: 2022-10-02 | Discharge: 2022-10-02 | Disposition: A | Discharge status: Home / Self Care | Payer: No Typology Code available for payment source | Source: Ambulatory Visit | Attending: Oncology | Admitting: Oncology

## 2022-10-02 ENCOUNTER — Encounter: Payer: Self-pay | Admitting: Cardiovascular Disease

## 2022-10-02 ENCOUNTER — Ambulatory Visit: Payer: 59 | Admitting: Cardiovascular Disease

## 2022-10-02 VITALS — BP 132/72 | HR 64 | Ht 76.0 in | Wt 225.0 lb

## 2022-10-02 DIAGNOSIS — I483 Typical atrial flutter: Secondary | ICD-10-CM

## 2022-10-02 DIAGNOSIS — Z85038 Personal history of other malignant neoplasm of large intestine: Secondary | ICD-10-CM

## 2022-10-02 DIAGNOSIS — R002 Palpitations: Secondary | ICD-10-CM

## 2022-10-02 DIAGNOSIS — C187 Malignant neoplasm of sigmoid colon: Secondary | ICD-10-CM | POA: Insufficient documentation

## 2022-10-02 DIAGNOSIS — D696 Thrombocytopenia, unspecified: Secondary | ICD-10-CM

## 2022-10-02 DIAGNOSIS — Z9889 Other specified postprocedural states: Secondary | ICD-10-CM

## 2022-10-02 MED ORDER — IOHEXOL 300 MG/ML  SOLN
100.0000 mL | Freq: Once | INTRAMUSCULAR | Status: AC | PRN
  Administered 2022-10-02: 100 mL via INTRAVENOUS

## 2022-10-02 MED ORDER — AMIODARONE HCL 200 MG PO TABS: 200.0000 mg | ORAL_TABLET | Freq: Two times a day (BID) | ORAL | 11 refills | Status: DC

## 2022-10-02 MED ORDER — BARIUM SULFATE 2 % PO SUSP
900.0000 mL | Freq: Once | ORAL | Status: AC
  Administered 2022-10-02: 900 mL via ORAL

## 2022-10-02 NOTE — Progress Notes (Addendum)
Cardiology Office Note   Date:  10/02/2022   ID:  Keith Trujillo, DOB Jan 02, 1961, MRN [id number]  PCP:  Sallyanne Kuster, NP  Cardiologist:  Adrian Blackwater, MD      History of Present Illness: Keith Trujillo is a 62 y.o. male who presents for  Chief Complaint  Patient presents with   New Patient (Initial Visit)    Consult    This is a 62 year old white male with a past medical history of atrial flutter presented to the hospital on June 28 with palpitation and fluttering.  EKG and [facility] emergency room revealed atrial flutter with ventricular rate 139/min.  Patient was placed on p.o. Cardizem and sent home for further evaluation.  Patient has intermittent palpitation but no chest pain or shortness of breath.  Palpitations  This is a new problem. The current episode started more than 1 month ago. The problem occurs 2 to 4 times per day. The problem has been waxing and waning. Associated symptoms include chest pain and shortness of breath.      Past Medical History:  Diagnosis Date   Abdominal pain    Allergy    Atrial flutter (HCC)    Cancer (HCC)    Cellulitis    Colon cancer (HCC)    Dyspnea    Liver spot    Loose stools    Weight loss      Past Surgical History:  Procedure Laterality Date   CATARACT EXTRACTION W/PHACO Left 02/08/2022   Procedure: CATARACT EXTRACTION PHACO AND INTRAOCULAR LENS PLACEMENT (IOC) LEFT  9.40  01:15.0;  Surgeon: Lockie Mola, MD;  Location: [facility];  Service: Ophthalmology;  Laterality: Left;   COLONOSCOPY WITH PROPOFOL N/A 01/02/2019   Procedure: COLONOSCOPY WITH BIOPSY;  Surgeon: Midge Minium, MD;  Location: [facility];  Service: Endoscopy;  Laterality: N/A;  Clip x2 at Biopsy Site   COLONOSCOPY WITH PROPOFOL N/A 10/29/2020   Procedure: COLONOSCOPY WITH PROPOFOL;  Surgeon: Midge Minium, MD;  Location: [facility];  Service: Endoscopy;  Laterality: N/A;   COLOSTOMY Right 02/06/2019   Procedure: COLOSTOMY;   Surgeon: Leafy Ro, MD;  Location: [facility] ORS;  Service: General;  Laterality: Right;   excision of skin lesion     FLEXIBLE SIGMOIDOSCOPY N/A 10/23/2019   Procedure: FLEXIBLE SIGMOIDOSCOPY WITH BIOPSY and DILATION;  Surgeon: Midge Minium, MD;  Location: [facility];  Service: Endoscopy;  Laterality: N/A;  needs port accessed   ILEOSTOMY CLOSURE N/A 01/13/2020   Procedure: ILEOSTOMY TAKEDOWN;  Surgeon: Leafy Ro, MD;  Location: [facility] OR[facility];  Service: General;  Laterality: N/A;   LAPAROSCOPIC SIGMOID COLECTOMY N/A 02/06/2019   Procedure: LAPAROSCOPIC SIGMOID COLECTOMY converted to open procedure;  Surgeon: Leafy Ro, MD;  Location: [facility] OR[facility];  Service: General;  Laterality: N/A;   POLYPECTOMY N/A 10/23/2019   Procedure: POLYPECTOMY;  Surgeon: Midge Minium, MD;  Location: [facility];  Service: Endoscopy;  Laterality: N/A;   PORT-A-CATH REMOVAL N/A 01/13/2020   Procedure: REMOVAL PORT-A-CATH;  Surgeon: Leafy Ro, MD;  Location: [facility] OR[facility];  Service: General;  Laterality: N/A;   PORTACATH PLACEMENT Right 03/06/2019   Procedure: INSERTION PORT-A-CATH;  Surgeon: Leafy Ro, MD;  Location: [facility];  Service: General;  Laterality: Right;   TONSILLECTOMY AND ADENOIDECTOMY  1969     Current Outpatient Medications  Medication Sig Dispense Refill   amiodarone (PACERONE) 200 MG tablet Take 1 tablet (200 mg total) by mouth 2 (two) times daily. 30 tablet 11  cetirizine (ZYRTEC) 10 MG tablet Take 10 mg by mouth daily.     diltiazem (CARDIZEM) 30 MG tablet Take 1 tablet (30 mg total) by mouth 3 (three) times daily as needed (palpitations/arrhythmia). 60 tablet 1   loratadine (CLARITIN) 10 MG tablet Take 10 mg by mouth daily.     Multiple Vitamin (MULTIVITAMIN) tablet Take 1 tablet by mouth daily. Centrum     Multiple Vitamins-Minerals (PRESERVISION AREDS 2 PO) Take by mouth 2 (two) times daily.     No current facility-administered medications for this visit.    Facility-Administered Medications Ordered in Other Visits  Medication Dose Route Frequency Provider Last Rate Last Admin   sodium chloride flush (NS) 0.9 % injection 10 mL  10 mL Intravenous PRN Creig Hines, MD   10 mL at 09/02/19 4132    Allergies:   Sulfa antibiotics    Social History:   reports that he has quit smoking. His smoking use included cigars. He quit smokeless tobacco use about 44 years ago. He reports current alcohol use. He reports that he does not use drugs.   Family History:  family history includes Lung cancer in his father.    ROS:     Review of Systems  Constitutional: Negative.   HENT: Negative.    Eyes: Negative.   Respiratory:  Positive for shortness of breath.   Cardiovascular:  Positive for chest pain and palpitations.  Gastrointestinal: Negative.   Genitourinary: Negative.   Musculoskeletal: Negative.   Skin: Negative.   Neurological: Negative.   Endo/Heme/Allergies: Negative.   Psychiatric/Behavioral: Negative.    All other systems reviewed and are negative.     All other systems are reviewed and negative.    PHYSICAL EXAM: VS:  BP 132/72   Pulse 64   Ht 6\' 4"  (1.93 m)   Wt 225 lb (102.1 kg)   SpO2 100%   BMI 27.39 kg/m  , BMI Body mass index is 27.39 kg/m. Last weight:  Wt Readings from Last 3 Encounters:  10/02/22 225 lb (102.1 kg)  09/15/22 225 lb 8.5 oz (102.3 kg)  09/15/22 225 lb 9.6 oz (102.3 kg)     Physical Exam Vitals reviewed.  Constitutional:      Appearance: Normal appearance. He is normal weight.  HENT:     Head: Normocephalic.     Nose: Nose normal.     Mouth/Throat:     Mouth: Mucous membranes are moist.  Eyes:     Pupils: Pupils are equal, round, and reactive to light.  Cardiovascular:     Rate and Rhythm: Normal rate and regular rhythm.     Pulses: Normal pulses.     Heart sounds: Normal heart sounds.  Pulmonary:     Effort: Pulmonary effort is normal.  Abdominal:     General: Abdomen is flat.  Bowel sounds are normal.  Musculoskeletal:        General: Normal range of motion.     Cervical back: Normal range of motion.  Skin:    General: Skin is warm.  Neurological:     General: No focal deficit present.     Mental Status: He is alert.  Psychiatric:        Mood and Affect: Mood normal.       EKG: NSR 62/MIN FIRST DEGREE AV BLOCK  Recent Labs: 09/15/2022: ALT 30; BUN 22; Creatinine, Ser 0.97; Hemoglobin 16.3; Platelets 162; Potassium 3.4; Sodium 140    Lipid Panel    Component Value Date/Time  CHOL 187 05/25/2022 0821   TRIG 45 05/25/2022 0821   HDL 94 05/25/2022 0821   CHOLHDL 2.0 05/25/2022 0821   LDLCALC 84 05/25/2022 0821      Other studies Reviewed: Additional studies/ records that were reviewed today include:  Review of the above records demonstrates:       No data to display            ASSESSMENT AND PLAN:    ICD-10-CM   1. Typical atrial flutter (HCC)  I48.3 amiodarone (PACERONE) 200 MG tablet    MYOCARDIAL PERFUSION IMAGING    EKG 12-Lead    PCV ECHOCARDIOGRAM COMPLETE   ADVISE ELLIQUIS, PO AMIODRONE, ECHO, STRESS TEST    2. Palpitation  R00.2 amiodarone (PACERONE) 200 MG tablet    MYOCARDIAL PERFUSION IMAGING    EKG 12-Lead    PCV ECHOCARDIOGRAM COMPLETE   Continue Cardizem p.o. and will start the patient on amiodarone 200 p.o. twice daily tapering after 2 to 3 weeks later to 200 a day.    3. Personal history of colon cancer  Z85.038 amiodarone (PACERONE) 200 MG tablet    MYOCARDIAL PERFUSION IMAGING    EKG 12-Lead    PCV ECHOCARDIOGRAM COMPLETE    4. Thrombocytopenia (HCC)  D69.6    H/o of it being 70,000,while on chemo, but now normal.  Since Eliquis will be protective of CVA from atrial flutter will place the patient on that    5. S/P colostomy takedown  Z98.890        Problem List Items Addressed This Visit       Other   Personal history of colon cancer   Relevant Medications   amiodarone (PACERONE) 200 MG tablet    Other Relevant Orders   MYOCARDIAL PERFUSION IMAGING   EKG 12-Lead   PCV ECHOCARDIOGRAM COMPLETE   S/P colostomy takedown   Other Visit Diagnoses     Typical atrial flutter (HCC)    -  Primary   ADVISE ELLIQUIS, PO AMIODRONE, ECHO, STRESS TEST   Relevant Medications   amiodarone (PACERONE) 200 MG tablet   Other Relevant Orders   MYOCARDIAL PERFUSION IMAGING   EKG 12-Lead   PCV ECHOCARDIOGRAM COMPLETE   Palpitation       Continue Cardizem p.o. and will start the patient on amiodarone 200 p.o. twice daily tapering after 2 to 3 weeks later to 200 a day.   Relevant Medications   amiodarone (PACERONE) 200 MG tablet   Other Relevant Orders   MYOCARDIAL PERFUSION IMAGING   EKG 12-Lead   PCV ECHOCARDIOGRAM COMPLETE   Thrombocytopenia (HCC)       H/o of it being 70,000,while on chemo, but now normal.  Since Eliquis will be protective of CVA from atrial flutter will place the patient on that          Disposition:   Return in about 4 weeks (around 10/30/2022) for ECHO, STRESS TEST AND F/U.    Total time spent: 45 minutes  Signed,  Adrian Blackwater, MD  10/02/2022 11:13 AM    [facility]

## 2022-10-27 ENCOUNTER — Other Ambulatory Visit: Payer: 59

## 2022-10-31 ENCOUNTER — Ambulatory Visit: Payer: 59 | Admitting: Cardiovascular Disease

## 2022-11-09 ENCOUNTER — Encounter: Payer: Self-pay | Admitting: Nurse Practitioner

## 2022-11-13 ENCOUNTER — Ambulatory Visit (INDEPENDENT_AMBULATORY_CARE_PROVIDER_SITE_OTHER): Payer: No Typology Code available for payment source | Admitting: Nurse Practitioner

## 2022-11-13 ENCOUNTER — Encounter: Payer: Self-pay | Admitting: Nurse Practitioner

## 2022-11-13 VITALS — BP 135/80 | HR 67 | Temp 98.6°F | Resp 16 | Ht 76.0 in | Wt 234.0 lb

## 2022-11-13 DIAGNOSIS — N528 Other male erectile dysfunction: Secondary | ICD-10-CM | POA: Diagnosis not present

## 2022-11-13 DIAGNOSIS — I483 Typical atrial flutter: Secondary | ICD-10-CM | POA: Diagnosis not present

## 2022-11-13 MED ORDER — TADALAFIL 5 MG PO TABS
5.0000 mg | ORAL_TABLET | Freq: Every day | ORAL | 2 refills | Status: DC
Start: 2022-11-13 — End: 2023-02-07

## 2022-11-13 NOTE — Progress Notes (Signed)
[facility] [address] [place], [place] [address]  Internal MEDICINE  Office Visit Note  Patient Name: Keith Trujillo  [id number]  [id number]  Date of Service: 11/13/2022  Chief Complaint  Patient presents with   Follow-up    HPI Keith Trujillo presents for a follow-up visit for Atrial flutter and ED.  Atrial flutter -- seen by Dr. Adrian Blackwater but patient was not satisfied with this office visit and still wants to meet with Dr. Kirke Corin. New patient appt with Dr. Kirke Corin is still scheduled for 11/28/22.  Erectile dysfunction -- has had this problem before, wants to try cialis.     Current Medication: Outpatient Encounter Medications as of 11/13/2022  Medication Sig   diltiazem (CARDIZEM) 30 MG tablet Take 1 tablet (30 mg total) by mouth 3 (three) times daily as needed (palpitations/arrhythmia).   loratadine (CLARITIN) 10 MG tablet Take 10 mg by mouth daily.   Multiple Vitamin (MULTIVITAMIN) tablet Take 1 tablet by mouth daily. Centrum   Multiple Vitamins-Minerals (PRESERVISION AREDS 2 PO) Take by mouth 2 (two) times daily.   tadalafil (CIALIS) 5 MG tablet Take 1 tablet (5 mg total) by mouth daily.   [DISCONTINUED] amiodarone (PACERONE) 200 MG tablet Take 1 tablet (200 mg total) by mouth 2 (two) times daily.   [DISCONTINUED] cetirizine (ZYRTEC) 10 MG tablet Take 10 mg by mouth daily. (Patient not taking: Reported on 11/28/2022)   [DISCONTINUED] sodium chloride flush (NS) 0.9 % injection 10 mL    No facility-administered encounter medications on file as of 11/13/2022.    Surgical History: Past Surgical History:  Procedure Laterality Date   CATARACT EXTRACTION W/PHACO Left 02/08/2022   Procedure: CATARACT EXTRACTION PHACO AND INTRAOCULAR LENS PLACEMENT (IOC) LEFT  9.40  01:15.0;  Surgeon: Lockie Mola, MD;  Location: [facility];  Service: Ophthalmology;  Laterality: Left;   COLONOSCOPY WITH PROPOFOL N/A 01/02/2019   Procedure: COLONOSCOPY WITH BIOPSY;  Surgeon:  Midge Minium, MD;  Location: [facility];  Service: Endoscopy;  Laterality: N/A;  Clip x2 at Biopsy Site   COLONOSCOPY WITH PROPOFOL N/A 10/29/2020   Procedure: COLONOSCOPY WITH PROPOFOL;  Surgeon: Midge Minium, MD;  Location: [facility];  Service: Endoscopy;  Laterality: N/A;   COLOSTOMY Right 02/06/2019   Procedure: COLOSTOMY;  Surgeon: Leafy Ro, MD;  Location: [facility] OR[facility];  Service: General;  Laterality: Right;   excision of skin lesion     FLEXIBLE SIGMOIDOSCOPY N/A 10/23/2019   Procedure: FLEXIBLE SIGMOIDOSCOPY WITH BIOPSY and DILATION;  Surgeon: Midge Minium, MD;  Location: [facility];  Service: Endoscopy;  Laterality: N/A;  needs port accessed   ILEOSTOMY CLOSURE N/A 01/13/2020   Procedure: ILEOSTOMY TAKEDOWN;  Surgeon: Leafy Ro, MD;  Location: [facility] OR[facility];  Service: General;  Laterality: N/A;   LAPAROSCOPIC SIGMOID COLECTOMY N/A 02/06/2019   Procedure: LAPAROSCOPIC SIGMOID COLECTOMY converted to open procedure;  Surgeon: Leafy Ro, MD;  Location: [facility] OR[facility];  Service: General;  Laterality: N/A;   POLYPECTOMY N/A 10/23/2019   Procedure: POLYPECTOMY;  Surgeon: Midge Minium, MD;  Location: [facility];  Service: Endoscopy;  Laterality: N/A;   PORT-A-CATH REMOVAL N/A 01/13/2020   Procedure: REMOVAL PORT-A-CATH;  Surgeon: Leafy Ro, MD;  Location: [facility] OR[facility];  Service: General;  Laterality: N/A;   PORTACATH PLACEMENT Right 03/06/2019   Procedure: INSERTION PORT-A-CATH;  Surgeon: Leafy Ro, MD;  Location: [facility] OR[facility];  Service: General;  Laterality: Right;   TONSILLECTOMY AND ADENOIDECTOMY  1969    Medical History:  Past Medical History:  Diagnosis Date   Abdominal pain    Allergy    Atrial flutter (HCC)    Cancer (HCC)    Cellulitis    Colon cancer (HCC)    Dyspnea    Liver spot    Loose stools    Weight loss     Family History: Family History  Problem Relation Age of Onset   Heart failure Mother    Lung cancer Father     Social  History   Socioeconomic History   Marital status: Married    Spouse name: Not on file   Number of children: Not on file   Years of education: Not on file   Highest education level: Not on file  Occupational History   Not on file  Tobacco Use   Smoking status: Former    Types: Cigars   Smokeless tobacco: Former    Quit date: 1980  Advertising account planner   Vaping status: Never Used  Substance and Sexual Activity   Alcohol use: Yes    Comment: occ.   Drug use: Never   Sexual activity: Yes  Other Topics Concern   Not on file  Social History Narrative   Not on file   Social Determinants of Health   Financial Resource Strain: Not on file  Food Insecurity: Not on file  Transportation Needs: Not on file  Physical Activity: Not on file  Stress: Not on file  Social Connections: Not on file  Intimate Partner Violence: Not on file      Review of Systems  Constitutional:  Positive for fatigue.  Respiratory:  Positive for shortness of breath. Negative for cough, chest tightness and wheezing.   Cardiovascular:  Positive for palpitations. Negative for chest pain.  Gastrointestinal: Negative.   Genitourinary:        Erectile dysfunction  Musculoskeletal: Negative.   Neurological: Negative.     Vital Signs: BP 135/80 Comment: 148/76  Pulse 67   Temp 98.6 F (37 C)   Resp 16   Ht 6\' 4"  (1.93 m)   Wt 234 lb (106.1 kg)   SpO2 98%   BMI 28.48 kg/m    Physical Exam Vitals reviewed.  Constitutional:      General: He is not in acute distress.    Appearance: Normal appearance. He is not ill-appearing.  HENT:     Head: Normocephalic and atraumatic.  Eyes:     Pupils: Pupils are equal, round, and reactive to light.  Cardiovascular:     Rate and Rhythm: Tachycardia present. Rhythm irregularly irregular.     Heart sounds: Murmur heard.  Pulmonary:     Effort: Pulmonary effort is normal. No respiratory distress.     Breath sounds: Normal breath sounds.  Neurological:     Mental  Status: He is alert and oriented to person, place, and time.  Psychiatric:        Mood and Affect: Mood normal.        Behavior: Behavior normal.        Assessment/Plan: 1. Other male erectile dysfunction Will try tadalafil as prescribed.  - tadalafil (CIALIS) 5 MG tablet; Take 1 tablet (5 mg total) by mouth daily.  Dispense: 30 tablet; Refill: 2  2. Typical atrial flutter (HCC) Keep appt with Dr. Kirke Corin as discussed    General Counseling: Zale verbalizes understanding of the findings of todays visit and agrees with plan of treatment. I have discussed any further diagnostic evaluation that may be needed or ordered  today. We also reviewed his medications today. he has been encouraged to call the office with any questions or concerns that should arise related to todays visit.    No orders of the defined types were placed in this encounter.   Meds ordered this encounter  Medications   tadalafil (CIALIS) 5 MG tablet    Sig: Take 1 tablet (5 mg total) by mouth daily.    Dispense:  30 tablet    Refill:  2    Fill new script today    Return for previously scheduled, CPE, Avila Albritton PCP in march 2025.   Total time spent:20 Minutes Time spent includes review of chart, medications, test results, and follow up plan with the patient.   [place] Controlled Substance Database was reviewed by me.  This patient was seen by Sallyanne Kuster, FNP-C in collaboration with Dr. Beverely Risen as a part of collaborative care agreement.   Adean Milosevic R. Tedd Sias, MSN, FNP-C Internal medicine

## 2022-11-28 ENCOUNTER — Encounter: Payer: Self-pay | Admitting: Cardiovascular Disease

## 2022-11-28 ENCOUNTER — Ambulatory Visit
Payer: No Typology Code available for payment source | Attending: Cardiovascular Disease | Admitting: Cardiovascular Disease

## 2022-11-28 VITALS — BP 120/62 | HR 60 | Ht 76.0 in | Wt 235.0 lb

## 2022-11-28 DIAGNOSIS — I483 Typical atrial flutter: Secondary | ICD-10-CM | POA: Diagnosis not present

## 2022-11-28 DIAGNOSIS — I4892 Unspecified atrial flutter: Secondary | ICD-10-CM

## 2022-11-28 NOTE — Patient Instructions (Signed)
Medication Instructions:  STOP the Aspirin  *If you need a refill on your cardiac medications before your next appointment, please call your pharmacy*   Lab Work: None ordered If you have labs (blood work) drawn today and your tests are completely normal, you will receive your results only by: [place] (if you have MyChart) OR A paper copy in the mail If you have any lab test that is abnormal or we need to change your treatment, we will call you to review the results.   Testing/Procedures: None ordered   Follow-Up: At [facility], you and your health needs are our priority.  As part of our continuing mission to provide you with exceptional heart care, we have created designated Provider Care Teams.  These Care Teams include your primary Cardiologist (physician) and Advanced Practice Providers (APPs -  Physician Assistants and Nurse Practitioners) who all work together to provide you with the care you need, when you need it.  We recommend signing up for the patient portal called "MyChart".  Sign up information is provided on this After Visit Summary.  MyChart is used to connect with patients for Virtual Visits (Telemedicine).  Patients are able to view lab/test results, encounter notes, upcoming appointments, etc.  Non-urgent messages can be sent to your provider as well.   To learn more about what you can do with MyChart, go to [email or web address].    Your next appointment:   6 month(s)  Provider:   You may see Dr. Fletcher Anon or one of the following Advanced Practice Providers on your designated Care Team:   Murray Hodgkins, NP Christell Faith, PA-C Cadence Kathlen Mody, PA-C Gerrie Nordmann, NP

## 2022-11-28 NOTE — Progress Notes (Unsigned)
Cardiology Office Note   Date:  11/29/2022   ID:  Keith Trujillo, DOB 05-Sep-1960, MRN [id number]  PCP:  Sallyanne Kuster, NP  Cardiologist:   Lorine Bears, MD   Chief Complaint  Patient presents with   New Patient (Initial Visit)    Atrial flutter no complaints today. Meds reviewed verbally with pt.      History of Present Illness: Keith Trujillo is a 62 y.o. male who was referred by Sallyanne Kuster for evaluation of atrial flutter.  The patient has history of colon cancer treated by surgery and chemotherapy in 2020 and he has been cured.  He has been relatively healthy throughout his life and has no history of diabetes, hypertension, stroke or congestive heart failure.  He is not a smoker. He went to [facility] ER in April with palpitations and was found to have typical atrial flutter and converted to sinus rhythm.  He was given diltiazem to be used as needed.  CHA2DS2-VASc score was 0 and thus was not placed on anticoagulation.  He underwent an outpatient echocardiogram which showed normal LV systolic function with mild mitral regurgitation. He had a second episode of palpitations and tachycardia and went to the emergency room at [facility] in June.  He was again found to be in typical atrial flutter with with 2-1 conduction.  He was given diltiazem and converted to sinus rhythm.  He never had chest pain or shortness of breath with these episodes.  No exertional symptoms overall.  He was drinking 12 cups of coffee around that time and since then he stopped completely.  He was also sleep deprived and frequently was sleeping 3 to 4 hours a night and since then he improved his sleep hygiene.  He also cut down on chocolate intake.  Since June, he had no episodes of palpitations.    Past Medical History:  Diagnosis Date   Abdominal pain    Allergy    Atrial flutter (HCC)    Cancer (HCC)    Cellulitis    Colon cancer (HCC)    Dyspnea    Liver spot    Loose stools    Weight loss     Past  Surgical History:  Procedure Laterality Date   CATARACT EXTRACTION W/PHACO Left 02/08/2022   Procedure: CATARACT EXTRACTION PHACO AND INTRAOCULAR LENS PLACEMENT (IOC) LEFT  9.40  01:15.0;  Surgeon: Lockie Mola, MD;  Location: [facility];  Service: Ophthalmology;  Laterality: Left;   COLONOSCOPY WITH PROPOFOL N/A 01/02/2019   Procedure: COLONOSCOPY WITH BIOPSY;  Surgeon: Midge Minium, MD;  Location: [facility];  Service: Endoscopy;  Laterality: N/A;  Clip x2 at Biopsy Site   COLONOSCOPY WITH PROPOFOL N/A 10/29/2020   Procedure: COLONOSCOPY WITH PROPOFOL;  Surgeon: Midge Minium, MD;  Location: [facility];  Service: Endoscopy;  Laterality: N/A;   COLOSTOMY Right 02/06/2019   Procedure: COLOSTOMY;  Surgeon: Leafy Ro, MD;  Location: [facility] ORS;  Service: General;  Laterality: Right;   excision of skin lesion     FLEXIBLE SIGMOIDOSCOPY N/A 10/23/2019   Procedure: FLEXIBLE SIGMOIDOSCOPY WITH BIOPSY and DILATION;  Surgeon: Midge Minium, MD;  Location: [facility];  Service: Endoscopy;  Laterality: N/A;  needs port accessed   ILEOSTOMY CLOSURE N/A 01/13/2020   Procedure: ILEOSTOMY TAKEDOWN;  Surgeon: Leafy Ro, MD;  Location: [facility] ORS;  Service: General;  Laterality: N/A;   LAPAROSCOPIC SIGMOID COLECTOMY N/A 02/06/2019   Procedure: LAPAROSCOPIC SIGMOID COLECTOMY converted to open procedure;  Surgeon: Leafy Ro, MD;  Location: [facility] ORS;  Service: General;  Laterality: N/A;   POLYPECTOMY N/A 10/23/2019   Procedure: POLYPECTOMY;  Surgeon: Midge Minium, MD;  Location: [facility];  Service: Endoscopy;  Laterality: N/A;   PORT-A-CATH REMOVAL N/A 01/13/2020   Procedure: REMOVAL PORT-A-CATH;  Surgeon: Leafy Ro, MD;  Location: [facility] ORS;  Service: General;  Laterality: N/A;   PORTACATH PLACEMENT Right 03/06/2019   Procedure: INSERTION PORT-A-CATH;  Surgeon: Leafy Ro, MD;  Location: [facility] OR[facility];  Service: General;  Laterality: Right;    TONSILLECTOMY AND ADENOIDECTOMY  1969     Current Outpatient Medications  Medication Sig Dispense Refill   diltiazem (CARDIZEM) 30 MG tablet Take 1 tablet (30 mg total) by mouth 3 (three) times daily as needed (palpitations/arrhythmia). 60 tablet 1   loratadine (CLARITIN) 10 MG tablet Take 10 mg by mouth daily.     Multiple Vitamin (MULTIVITAMIN) tablet Take 1 tablet by mouth daily. Centrum     Multiple Vitamins-Minerals (PRESERVISION AREDS 2 PO) Take by mouth 2 (two) times daily.     tadalafil (CIALIS) 5 MG tablet Take 1 tablet (5 mg total) by mouth daily. 30 tablet 2   No current facility-administered medications for this visit.    Allergies:   Sulfa antibiotics    Social History:  The patient  reports that he has quit smoking. His smoking use included cigars. He quit smokeless tobacco use about 44 years ago. He reports current alcohol use. He reports that he does not use drugs.   Family History:  The patient's family history includes Heart failure in his mother; Lung cancer in his father.    ROS:  Please see the history of present illness.   Otherwise, review of systems are positive for none.   All other systems are reviewed and negative.    PHYSICAL EXAM: VS:  BP 120/62 (BP Location: Right Arm, Patient Position: Sitting, Cuff Size: Large)   Pulse 60   Ht 6\' 4"  (1.93 m)   Wt 235 lb (106.6 kg)   SpO2 98%   BMI 28.61 kg/m  , BMI Body mass index is 28.61 kg/m. GEN: Well nourished, well developed, in no acute distress  HEENT: normal  Neck: no JVD, carotid bruits, or masses Cardiac: RRR; no murmurs, rubs, or gallops,no edema  Respiratory:  clear to auscultation bilaterally, normal work of breathing GI: soft, nontender, nondistended, + BS MS: no deformity or atrophy  Skin: warm and dry, no rash Neuro:  Strength and sensation are intact Psych: euthymic mood, full affect   EKG:  EKG is ordered today. The ekg ordered today demonstrates :  Sinus rhythm with 1st degree A-V  block When compared with ECG of 15-Sep-2022 20:03, Sinus rhythm has replaced Atrial flutter Vent. rate has decreased BY  79 BPM    Recent Labs: 09/15/2022: ALT 30; BUN 22; Creatinine, Ser 0.97; Hemoglobin 16.3; Platelets 162; Potassium 3.4; Sodium 140    Lipid Panel    Component Value Date/Time   CHOL 187 05/25/2022 0821   TRIG 45 05/25/2022 0821   HDL 94 05/25/2022 0821   CHOLHDL 2.0 05/25/2022 0821   LDLCALC 84 05/25/2022 0821      Wt Readings from Last 3 Encounters:  11/28/22 235 lb (106.6 kg)  11/13/22 234 lb (106.1 kg)  10/02/22 225 lb (102.1 kg)          11/28/2022    2:54 PM  PAD Screen  Previous PAD dx? No  Previous surgical  procedure? No  Pain with walking? No  Feet/toe relief with dangling? No  Painful, non-healing ulcers? No  Extremities discolored? No      ASSESSMENT AND PLAN:  1.  Paroxysmal typical atrial flutter: It is possible that some of the episodes might have been triggered by excessive caffeine and some alcohol intake .  Since then, he eliminated caffeine altogether and improved his sleep schedule. I think it is reasonable to continue with diltiazem as needed for now.  If he has recurrent atrial flutter, I think the best option is to proceed with ablation.  I do not recommend antiarrhythmic medications. His chads Vascor is 0.  Thus, no need for anticoagulation at this point.    Disposition:   FU with me in 6 months  Signed,  Lorine Bears, MD  11/29/2022 1:47 PM    [facility]

## 2022-12-10 ENCOUNTER — Encounter: Payer: Self-pay | Admitting: Nurse Practitioner

## 2022-12-12 ENCOUNTER — Inpatient Hospital Stay: Payer: No Typology Code available for payment source | Attending: Oncology

## 2022-12-12 ENCOUNTER — Encounter: Payer: Self-pay | Admitting: Oncology

## 2022-12-12 ENCOUNTER — Inpatient Hospital Stay (HOSPITAL_BASED_OUTPATIENT_CLINIC_OR_DEPARTMENT_OTHER): Payer: No Typology Code available for payment source | Admitting: Oncology

## 2022-12-12 VITALS — BP 132/77 | HR 63 | Temp 96.2°F | Resp 18 | Ht 76.0 in | Wt 235.8 lb

## 2022-12-12 DIAGNOSIS — R970 Elevated carcinoembryonic antigen [CEA]: Secondary | ICD-10-CM | POA: Insufficient documentation

## 2022-12-12 DIAGNOSIS — I4892 Unspecified atrial flutter: Secondary | ICD-10-CM | POA: Diagnosis not present

## 2022-12-12 DIAGNOSIS — R634 Abnormal weight loss: Secondary | ICD-10-CM | POA: Insufficient documentation

## 2022-12-12 DIAGNOSIS — Z08 Encounter for follow-up examination after completed treatment for malignant neoplasm: Secondary | ICD-10-CM | POA: Diagnosis not present

## 2022-12-12 DIAGNOSIS — Z85038 Personal history of other malignant neoplasm of large intestine: Secondary | ICD-10-CM

## 2022-12-12 DIAGNOSIS — Z87891 Personal history of nicotine dependence: Secondary | ICD-10-CM | POA: Diagnosis not present

## 2022-12-12 DIAGNOSIS — C187 Malignant neoplasm of sigmoid colon: Secondary | ICD-10-CM

## 2022-12-12 DIAGNOSIS — Z79899 Other long term (current) drug therapy: Secondary | ICD-10-CM | POA: Diagnosis not present

## 2022-12-12 LAB — CBC WITH DIFFERENTIAL/PLATELET
Abs Immature Granulocytes: 0.01 10*3/uL (ref 0.00–0.07)
Basophils Absolute: 0 10*3/uL (ref 0.0–0.1)
Basophils Relative: 1 %
Eosinophils Absolute: 0.1 10*3/uL (ref 0.0–0.5)
Eosinophils Relative: 2 %
HCT: 40.2 % (ref 39.0–52.0)
Hemoglobin: 12.9 g/dL — ABNORMAL LOW (ref 13.0–17.0)
Immature Granulocytes: 0 %
Lymphocytes Relative: 20 %
Lymphs Abs: 1.1 10*3/uL (ref 0.7–4.0)
MCH: 31.9 pg (ref 26.0–34.0)
MCHC: 32.1 g/dL (ref 30.0–36.0)
MCV: 99.5 fL (ref 80.0–100.0)
Monocytes Absolute: 0.3 10*3/uL (ref 0.1–1.0)
Monocytes Relative: 6 %
Neutro Abs: 4 10*3/uL (ref 1.7–7.7)
Neutrophils Relative %: 71 %
Platelets: 125 10*3/uL — ABNORMAL LOW (ref 150–400)
RBC: 4.04 MIL/uL — ABNORMAL LOW (ref 4.22–5.81)
RDW: 13 % (ref 11.5–15.5)
WBC: 5.5 10*3/uL (ref 4.0–10.5)
nRBC: 0 % (ref 0.0–0.2)

## 2022-12-12 LAB — VITAMIN B12: Vitamin B-12: 792 pg/mL (ref 180–914)

## 2022-12-12 LAB — COMPREHENSIVE METABOLIC PANEL
ALT: 25 U/L (ref 0–44)
AST: 23 U/L (ref 15–41)
Albumin: 4.3 g/dL (ref 3.5–5.0)
Alkaline Phosphatase: 58 U/L (ref 38–126)
Anion gap: 8 (ref 5–15)
BUN: 20 mg/dL (ref 8–23)
CO2: 25 mmol/L (ref 22–32)
Calcium: 9.2 mg/dL (ref 8.9–10.3)
Chloride: 105 mmol/L (ref 98–111)
Creatinine, Ser: 0.88 mg/dL (ref 0.61–1.24)
GFR, Estimated: >60 mL/min (ref >60)
Glucose, Bld: 114 mg/dL — ABNORMAL HIGH (ref 70–99)
Potassium: 3.8 mmol/L (ref 3.5–5.1)
Sodium: 138 mmol/L (ref 135–145)
Total Bilirubin: 0.7 mg/dL (ref 0.3–1.2)
Total Protein: 7.2 g/dL (ref 6.5–8.1)

## 2022-12-12 LAB — TSH: TSH: 3.768 u[IU]/mL (ref 0.350–4.500)

## 2022-12-12 NOTE — Progress Notes (Signed)
Hematology/Oncology Consult note [facility]  [phone] [phone]  Patient Care Team: Sallyanne Kuster, NP as PCP - General (Nurse Practitioner) Iran Ouch, MD as PCP - Cardiology (Cardiology) Benita Gutter, RN as Oncology Nurse Navigator Pabon, Merri Ray, MD as Consulting Physician (General Surgery) Creig Hines, MD as Consulting Physician (Oncology)   Name of the patient: Keith Trujillo  [id number]  04/13/1960   Date of visit: 12/12/22  Diagnosis- stage IIIb colon adenocarcinoma currently in remission   Chief complaint/ Reason for visit-follow-up of colon cancer  Heme/Onc history: patient is a 62 year old male who was seen by Dr. Servando Snare from GI for symptoms of diarrhea as well as abdominal pain.Keith Trujillo underwent a colonoscopy on 01/02/2019 which showed an ulcerated partially obstructing large mass in the sigmoid colon which measured 10 cm in length biopsy showed adenocarcinoma moderately differentiated.  Baseline CEA elevated at 71.7.   CT abdomen pelvis with contrast showed irregular marked circumferential wall thickening in the sigmoid colon near the rectosigmoid junction.  The lesion longitudinally extends for approximately 5 to 6 cm.  4.1 x 2.8 cm ill-defined irregular paracolonic soft tissue mass may represent direct tumor extension or contiguous markedly enlarged lymph node.  Abnormal lymph nodes also seen in the perirectal and presacral space concerning for metastatic disease.  1.5 cm ill-defined hypoattenuating lesion in the medial segment of the left liver which may be focal fatty deposition but cannot be definitively characterized.  MRI did not show any evidence of liver metastases.  CT chest was negative for metastatic disease.   Patient underwent a low anterior resection of the sigmoid mass.  Final pathology showed 8 cm invasive colorectal adenocarcinoma grade 2.  Tumor invades through the muscularis propria into  pericolorectal tissue.  All margins are uninvolvedLymphovascular invasion and perineural invasion present.  Lymph nodes 1+ out of 20 PT3PN1A.  MSI stable   Patient completed 12 cycles of adjuvant FOLFOX chemotherapy on 09/16/2019  Interval history-Keith Trujillo is doing well presently.  Denies any abdominal complaints such as nausea vomiting abdominal pain or changes in his bowel habits.  Appetite and weight have remained stable.  Denies any blood in stools  ECOG PS- 0 Pain scale- 0   Review of systems- Review of Systems  Constitutional:  Negative for chills, fever, malaise/fatigue and weight loss.  HENT:  Negative for congestion, ear discharge and nosebleeds.   Eyes:  Negative for blurred vision.  Respiratory:  Negative for cough, hemoptysis, sputum production, shortness of breath and wheezing.   Cardiovascular:  Negative for chest pain, palpitations, orthopnea and claudication.  Gastrointestinal:  Negative for abdominal pain, blood in stool, constipation, diarrhea, heartburn, melena, nausea and vomiting.  Genitourinary:  Negative for dysuria, flank pain, frequency, hematuria and urgency.  Musculoskeletal:  Negative for back pain, joint pain and myalgias.  Skin:  Negative for rash.  Neurological:  Negative for dizziness, tingling, focal weakness, seizures, weakness and headaches.  Endo/Heme/Allergies:  Does not bruise/bleed easily.  Psychiatric/Behavioral:  Negative for depression and suicidal ideas. The patient does not have insomnia.       Allergies  Allergen Reactions   Sulfa Antibiotics Rash     Past Medical History:  Diagnosis Date   Abdominal pain    Allergy    Atrial flutter (HCC)    Cancer (HCC)    Cellulitis    Colon cancer (HCC)    Dyspnea    Liver spot    Loose stools    Weight loss  Past Surgical History:  Procedure Laterality Date   CATARACT EXTRACTION W/PHACO Left 02/08/2022   Procedure: CATARACT EXTRACTION PHACO AND INTRAOCULAR LENS PLACEMENT (IOC) LEFT  9.40   01:15.0;  Surgeon: Lockie Mola, MD;  Location: [facility];  Service: Ophthalmology;  Laterality: Left;   COLONOSCOPY WITH PROPOFOL N/A 01/02/2019   Procedure: COLONOSCOPY WITH BIOPSY;  Surgeon: Midge Minium, MD;  Location: [facility];  Service: Endoscopy;  Laterality: N/A;  Clip x2 at Biopsy Site   COLONOSCOPY WITH PROPOFOL N/A 10/29/2020   Procedure: COLONOSCOPY WITH PROPOFOL;  Surgeon: Midge Minium, MD;  Location: [facility];  Service: Endoscopy;  Laterality: N/A;   COLOSTOMY Right 02/06/2019   Procedure: COLOSTOMY;  Surgeon: Leafy Ro, MD;  Location: [facility] OR[facility];  Service: General;  Laterality: Right;   excision of skin lesion     FLEXIBLE SIGMOIDOSCOPY N/A 10/23/2019   Procedure: FLEXIBLE SIGMOIDOSCOPY WITH BIOPSY and DILATION;  Surgeon: Midge Minium, MD;  Location: [facility];  Service: Endoscopy;  Laterality: N/A;  needs port accessed   ILEOSTOMY CLOSURE N/A 01/13/2020   Procedure: ILEOSTOMY TAKEDOWN;  Surgeon: Leafy Ro, MD;  Location: [facility] OR[facility];  Service: General;  Laterality: N/A;   LAPAROSCOPIC SIGMOID COLECTOMY N/A 02/06/2019   Procedure: LAPAROSCOPIC SIGMOID COLECTOMY converted to open procedure;  Surgeon: Leafy Ro, MD;  Location: [facility] OR[facility];  Service: General;  Laterality: N/A;   POLYPECTOMY N/A 10/23/2019   Procedure: POLYPECTOMY;  Surgeon: Midge Minium, MD;  Location: [facility];  Service: Endoscopy;  Laterality: N/A;   PORT-A-CATH REMOVAL N/A 01/13/2020   Procedure: REMOVAL PORT-A-CATH;  Surgeon: Leafy Ro, MD;  Location: [facility] OR[facility];  Service: General;  Laterality: N/A;   PORTACATH PLACEMENT Right 03/06/2019   Procedure: INSERTION PORT-A-CATH;  Surgeon: Leafy Ro, MD;  Location: [facility] OR[facility];  Service: General;  Laterality: Right;   TONSILLECTOMY AND ADENOIDECTOMY  1969    Social History   Socioeconomic History   Marital status: Married    Spouse name: Not on file   Number of children: Not on file   Years of  education: Not on file   Highest education level: Not on file  Occupational History   Not on file  Tobacco Use   Smoking status: Former    Types: Cigars   Smokeless tobacco: Former    Quit date: 1980  Vaping Use   Vaping status: Never Used  Substance and Sexual Activity   Alcohol use: Yes    Comment: occ.   Drug use: Never   Sexual activity: Yes  Other Topics Concern   Not on file  Social History Narrative   Not on file   Social Determinants of Health   Financial Resource Strain: Not on file  Food Insecurity: Not on file  Transportation Needs: Not on file  Physical Activity: Not on file  Stress: Not on file  Social Connections: Not on file  Intimate Partner Violence: Not on file    Family History  Problem Relation Age of Onset   Heart failure Mother    Lung cancer Father      Current Outpatient Medications:    diltiazem (CARDIZEM) 30 MG tablet, Take 1 tablet (30 mg total) by mouth 3 (three) times daily as needed (palpitations/arrhythmia)., Disp: 60 tablet, Rfl: 1   loratadine (CLARITIN) 10 MG tablet, Take 10 mg by mouth daily., Disp: , Rfl:    Multiple Vitamin (MULTIVITAMIN) tablet, Take 1 tablet by mouth daily. Centrum, Disp: , Rfl:  Multiple Vitamins-Minerals (PRESERVISION AREDS 2 PO), Take by mouth 2 (two) times daily., Disp: , Rfl:    tadalafil (CIALIS) 5 MG tablet, Take 1 tablet (5 mg total) by mouth daily., Disp: 30 tablet, Rfl: 2  Physical exam:  Vitals:   12/12/22 1326  BP: 132/77  Pulse: 63  Resp: 18  Temp: (!) 96.2 F (35.7 C)  TempSrc: Tympanic  SpO2: 100%  Weight: 235 lb 12.8 oz (107 kg)  Height: 6\' 4"  (1.93 m)   Physical Exam Cardiovascular:     Rate and Rhythm: Normal rate and regular rhythm.     Heart sounds: Normal heart sounds.  Pulmonary:     Effort: Pulmonary effort is normal.     Breath sounds: Normal breath sounds.  Abdominal:     General: Bowel sounds are normal.     Palpations: Abdomen is soft.  Musculoskeletal:      Cervical back: Normal range of motion.  Skin:    General: Skin is warm and dry.  Neurological:     Mental Status: Keith Trujillo is alert and oriented to person, place, and time.         Latest Ref Rng & Units 12/12/2022   12:47 PM  CMP  Glucose 70 - 99 mg/dL 409   BUN 8 - 23 mg/dL 20   Creatinine 8.11 - 1.24 mg/dL 9.14   Sodium 782 - 956 mmol/L 138   Potassium 3.5 - 5.1 mmol/L 3.8   Chloride 98 - 111 mmol/L 105   CO2 22 - 32 mmol/L 25   Calcium 8.9 - 10.3 mg/dL 9.2   Total Protein 6.5 - 8.1 g/dL 7.2   Total Bilirubin 0.3 - 1.2 mg/dL 0.7   Alkaline Phos 38 - 126 U/L 58   AST 15 - 41 U/L 23   ALT 0 - 44 U/L 25       Latest Ref Rng & Units 12/12/2022   12:47 PM  CBC  WBC 4.0 - 10.5 K/uL 5.5   Hemoglobin 13.0 - 17.0 g/dL 21.3   Hematocrit 08.6 - 52.0 % 40.2   Platelets 150 - 400 K/uL 125      Assessment and plan- Patient is a 62 y.o. 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  Keith Trujillo s/p 12 cycles of adjuvant FOLFOX chemotherapy.  Keith Trujillo is here for routine surveillance visit  CT chest abdomen pelvis with contrast from July 2024 did not show any evidence of recurrent disease.  Keith Trujillo is now 3 years out of colon cancer and remains in remission.  Keith Trujillo would be due for repeat colonoscopy next year.  Clinically Keith Trujillo is doing well with no signs and symptoms of recurrence based on today's exam.  I will see him back in 6 months with CBC with differential CMP and CEA   Visit Diagnosis 1. Encounter for follow-up surveillance of colon cancer      Dr. Owens Shark, MD, MPH [facility] [phone] 12/12/2022 8:48 PM

## 2022-12-13 LAB — CEA: CEA: 1.7 ng/mL (ref 0.0–4.7)

## 2023-02-07 ENCOUNTER — Other Ambulatory Visit: Payer: Self-pay | Admitting: Nurse Practitioner

## 2023-02-07 DIAGNOSIS — N528 Other male erectile dysfunction: Secondary | ICD-10-CM

## 2023-02-07 NOTE — Telephone Encounter (Signed)
Please review

## 2023-04-09 DIAGNOSIS — H359 Unspecified retinal disorder: Secondary | ICD-10-CM | POA: Diagnosis not present

## 2023-04-09 DIAGNOSIS — H3322 Serous retinal detachment, left eye: Secondary | ICD-10-CM | POA: Diagnosis not present

## 2023-04-30 ENCOUNTER — Other Ambulatory Visit: Payer: Self-pay | Admitting: Nurse Practitioner

## 2023-04-30 DIAGNOSIS — N528 Other male erectile dysfunction: Secondary | ICD-10-CM

## 2023-04-30 NOTE — Telephone Encounter (Signed)
 Next appt 3/25

## 2023-05-11 DIAGNOSIS — H3322 Serous retinal detachment, left eye: Secondary | ICD-10-CM | POA: Diagnosis not present

## 2023-06-11 ENCOUNTER — Encounter: Payer: Self-pay | Admitting: Nurse Practitioner

## 2023-06-11 ENCOUNTER — Other Ambulatory Visit: Payer: Self-pay

## 2023-06-11 ENCOUNTER — Ambulatory Visit (INDEPENDENT_AMBULATORY_CARE_PROVIDER_SITE_OTHER): Payer: Self-pay | Admitting: Nurse Practitioner

## 2023-06-11 VITALS — BP 134/70 | HR 68 | Temp 98.4°F | Resp 16 | Ht 76.0 in | Wt 236.4 lb

## 2023-06-11 DIAGNOSIS — Z125 Encounter for screening for malignant neoplasm of prostate: Secondary | ICD-10-CM

## 2023-06-11 DIAGNOSIS — E782 Mixed hyperlipidemia: Secondary | ICD-10-CM | POA: Diagnosis not present

## 2023-06-11 DIAGNOSIS — N528 Other male erectile dysfunction: Secondary | ICD-10-CM | POA: Diagnosis not present

## 2023-06-11 DIAGNOSIS — I483 Typical atrial flutter: Secondary | ICD-10-CM

## 2023-06-11 DIAGNOSIS — Z0001 Encounter for general adult medical examination with abnormal findings: Secondary | ICD-10-CM

## 2023-06-11 DIAGNOSIS — Z08 Encounter for follow-up examination after completed treatment for malignant neoplasm: Secondary | ICD-10-CM

## 2023-06-11 MED ORDER — TADALAFIL 5 MG PO TABS
5.0000 mg | ORAL_TABLET | Freq: Every day | ORAL | 3 refills | Status: AC
Start: 2023-06-11 — End: ?

## 2023-06-11 NOTE — Progress Notes (Signed)
 [facility] [address] [place], [place] [address]  Internal MEDICINE  Office Visit Note  Patient Name: Keith Trujillo  [id number]  [id number]  Date of Service: 06/11/2023  Chief Complaint  Patient presents with   Annual Exam    HPI Keith Trujillo presents for an annual well visit and physical exam.  Well-appearing 63 y.o. male with atrial flutter Routine CRC screening: colonoscopy due in August this year  Labs:  routine labs due now  New or worsening pain: none  Other concerns: none     Current Medication: Outpatient Encounter Medications as of 06/11/2023  Medication Sig   diltiazem (CARDIZEM) 30 MG tablet Take 1 tablet (30 mg total) by mouth 3 (three) times daily as needed (palpitations/arrhythmia).   loratadine (CLARITIN) 10 MG tablet Take 10 mg by mouth daily.   Multiple Vitamin (MULTIVITAMIN) tablet Take 1 tablet by mouth daily. Centrum   Multiple Vitamins-Minerals (PRESERVISION AREDS 2 PO) Take by mouth 2 (two) times daily.   [DISCONTINUED] tadalafil (CIALIS) 5 MG tablet TAKE (1) TABLET BY MOUTH EVERY DAY   tadalafil (CIALIS) 5 MG tablet Take 1 tablet (5 mg total) by mouth daily.   No facility-administered encounter medications on file as of 06/11/2023.    Surgical History: Past Surgical History:  Procedure Laterality Date   CATARACT EXTRACTION W/PHACO Left 02/08/2022   Procedure: CATARACT EXTRACTION PHACO AND INTRAOCULAR LENS PLACEMENT (IOC) LEFT  9.40  01:15.0;  Surgeon: Lockie Mola, MD;  Location: [facility];  Service: Ophthalmology;  Laterality: Left;   COLONOSCOPY WITH PROPOFOL N/A 01/02/2019   Procedure: COLONOSCOPY WITH BIOPSY;  Surgeon: Midge Minium, MD;  Location: [facility];  Service: Endoscopy;  Laterality: N/A;  Clip x2 at Biopsy Site   COLONOSCOPY WITH PROPOFOL N/A 10/29/2020   Procedure: COLONOSCOPY WITH PROPOFOL;  Surgeon: Midge Minium, MD;  Location: [facility];  Service: Endoscopy;  Laterality: N/A;    COLOSTOMY Right 02/06/2019   Procedure: COLOSTOMY;  Surgeon: Leafy Ro, MD;  Location: [facility];  Service: General;  Laterality: Right;   excision of skin lesion     FLEXIBLE SIGMOIDOSCOPY N/A 10/23/2019   Procedure: FLEXIBLE SIGMOIDOSCOPY WITH BIOPSY and DILATION;  Surgeon: Midge Minium, MD;  Location: [facility];  Service: Endoscopy;  Laterality: N/A;  needs port accessed   ILEOSTOMY CLOSURE N/A 01/13/2020   Procedure: ILEOSTOMY TAKEDOWN;  Surgeon: Leafy Ro, MD;  Location: [facility];  Service: General;  Laterality: N/A;   LAPAROSCOPIC SIGMOID COLECTOMY N/A 02/06/2019   Procedure: LAPAROSCOPIC SIGMOID COLECTOMY converted to open procedure;  Surgeon: Leafy Ro, MD;  Location: [facility];  Service: General;  Laterality: N/A;   POLYPECTOMY N/A 10/23/2019   Procedure: POLYPECTOMY;  Surgeon: Midge Minium, MD;  Location: [facility];  Service: Endoscopy;  Laterality: N/A;   PORT-A-CATH REMOVAL N/A 01/13/2020   Procedure: REMOVAL PORT-A-CATH;  Surgeon: Leafy Ro, MD;  Location: [facility];  Service: General;  Laterality: N/A;   PORTACATH PLACEMENT Right 03/06/2019   Procedure: INSERTION PORT-A-CATH;  Surgeon: Leafy Ro, MD;  Location: [facility];  Service: General;  Laterality: Right;   TONSILLECTOMY AND ADENOIDECTOMY  1969    Medical History: Past Medical History:  Diagnosis Date   Abdominal pain    Allergy    Atrial flutter (HCC)    Cancer (HCC)    Cellulitis    Colon cancer (HCC)    Dyspnea    Liver spot    Loose stools    Weight loss  Family History: Family History  Problem Relation Age of Onset   Heart failure Mother    Lung cancer Father     Social History   Socioeconomic History   Marital status: Married    Spouse name: Not on file   Number of children: Not on file   Years of education: Not on file   Highest education level: Not on file  Occupational History   Not on file  Tobacco Use   Smoking status: Former    Types: Cigars    Smokeless tobacco: Former    Quit date: 1980  Vaping Use   Vaping status: Never Used  Substance and Sexual Activity   Alcohol use: Yes    Comment: occ.   Drug use: Never   Sexual activity: Yes  Other Topics Concern   Not on file  Social History Narrative   Not on file   Social Drivers of Health   Financial Resource Strain: Not on file  Food Insecurity: Not on file  Transportation Needs: Not on file  Physical Activity: Not on file  Stress: Not on file  Social Connections: Not on file  Intimate Partner Violence: Not on file      Review of Systems  Constitutional:  Negative for activity change, appetite change, chills, fatigue, fever and unexpected weight change.  HENT: Negative.  Negative for congestion, ear pain, rhinorrhea, sore throat and trouble swallowing.   Eyes: Negative.   Respiratory: Negative.  Negative for cough, chest tightness, shortness of breath and wheezing.   Cardiovascular: Negative.  Negative for chest pain and palpitations.  Gastrointestinal: Negative.  Negative for abdominal pain, blood in stool, constipation, diarrhea, nausea and vomiting.  Endocrine: Negative.   Genitourinary: Negative.  Negative for difficulty urinating, dysuria, frequency, hematuria and urgency.  Musculoskeletal: Negative.  Negative for arthralgias, back pain, joint swelling, myalgias and neck pain.  Skin: Negative.  Negative for rash and wound.  Allergic/Immunologic: Negative.  Negative for immunocompromised state.  Neurological: Negative.  Negative for dizziness, seizures, numbness and headaches.  Hematological: Negative.   Psychiatric/Behavioral: Negative.  Negative for behavioral problems, self-injury and suicidal ideas. The patient is not nervous/anxious.     Vital Signs: BP 134/70   Pulse 68   Temp 98.4 F (36.9 C)   Resp 16   Ht 6\' 4"  (1.93 m)   Wt 236 lb 6.4 oz (107.2 kg)   SpO2 99%   BMI 28.78 kg/m    Physical Exam Vitals reviewed.  Constitutional:       General: He is awake. He is not in acute distress.    Appearance: Normal appearance. He is well-developed, well-groomed and overweight. He is not ill-appearing or diaphoretic.  HENT:     Head: Normocephalic and atraumatic.     Right Ear: Tympanic membrane, ear canal and external ear normal. There is no impacted cerumen.     Left Ear: Tympanic membrane, ear canal and external ear normal. There is no impacted cerumen.     Nose: Nose normal. No congestion or rhinorrhea.     Mouth/Throat:     Mouth: Mucous membranes are moist.     Pharynx: Oropharynx is clear. No oropharyngeal exudate or posterior oropharyngeal erythema.  Eyes:     General: Lids are normal. Vision grossly intact. Gaze aligned appropriately. No scleral icterus.       Right eye: No discharge.        Left eye: No discharge.     Extraocular Movements: Extraocular movements intact.  Conjunctiva/sclera: Conjunctivae normal.     Pupils: Pupils are equal, round, and reactive to light.  Neck:     Thyroid: No thyromegaly.     Vascular: No JVD.     Trachea: Trachea and phonation normal. No tracheal deviation.  Cardiovascular:     Rate and Rhythm: Normal rate and regular rhythm.     Pulses: Normal pulses.     Heart sounds: Normal heart sounds, S1 normal and S2 normal. No murmur heard.    No friction rub. No gallop.  Pulmonary:     Effort: Pulmonary effort is normal. No accessory muscle usage or respiratory distress.     Breath sounds: Normal breath sounds and air entry. No stridor. No wheezing or rales.  Chest:     Chest wall: No tenderness.  Abdominal:     General: Bowel sounds are normal. There is no distension.     Palpations: Abdomen is soft. There is no shifting dullness, fluid wave, mass or pulsatile mass.     Tenderness: There is no abdominal tenderness. There is no guarding or rebound.  Musculoskeletal:        General: No tenderness or deformity. Normal range of motion.     Cervical back: Normal range of motion and  neck supple.     Right lower leg: No edema.     Left lower leg: No edema.  Lymphadenopathy:     Cervical: No cervical adenopathy.  Skin:    General: Skin is warm and dry.     Capillary Refill: Capillary refill takes less than 2 seconds.     Coloration: Skin is not pale.     Findings: No erythema or rash.  Neurological:     Mental Status: He is alert and oriented to person, place, and time.     Cranial Nerves: No cranial nerve deficit.     Motor: No abnormal muscle tone.     Coordination: Coordination normal.     Gait: Gait normal.     Deep Tendon Reflexes: Reflexes are normal and symmetric.  Psychiatric:        Mood and Affect: Mood and affect normal.        Behavior: Behavior normal. Behavior is cooperative.        Thought Content: Thought content normal.        Judgment: Judgment normal.        Assessment/Plan: 1. Encounter for routine adult health examination with abnormal findings (Primary) Age-appropriate preventive screenings and vaccinations discussed, annual physical exam completed. Routine labs for health maintenance ordered, see below. PHM updated.    2. Typical atrial flutter (HCC) No issues since last year. Sees cardiology annually   3. Mixed hyperlipidemia Routine lab ordered  - Lipid Profile  4. Other male erectile dysfunction Continue tadalafil as prescribed . - tadalafil (CIALIS) 5 MG tablet; Take 1 tablet (5 mg total) by mouth daily.  Dispense: 90 tablet; Refill: 3  5. Screening for prostate cancer Routine lab ordered  - PSA Total (Reflex To Free)      General Counseling: Reginald verbalizes understanding of the findings of todays visit and agrees with plan of treatment. I have discussed any further diagnostic evaluation that may be needed or ordered today. We also reviewed his medications today. he has been encouraged to call the office with any questions or concerns that should arise related to todays visit.    Orders Placed This Encounter   Procedures   Lipid Profile   PSA Total (Reflex To Free)  Meds ordered this encounter  Medications   tadalafil (CIALIS) 5 MG tablet    Sig: Take 1 tablet (5 mg total) by mouth daily.    Dispense:  90 tablet    Refill:  3    Please fill as a 90 day supply thanks    Return in about 1 year (around 06/10/2024) for CPE, Videl Nobrega PCP and otherwise as needed..   Total time spent:30 Minutes Time spent includes review of chart, medications, test results, and follow up plan with the patient.   [place] Controlled Substance Database was reviewed by me.  This patient was seen by Sallyanne Kuster, FNP-C in collaboration with Dr. Beverely Risen as a part of collaborative care agreement.  Cova Knieriem R. Tedd Sias, MSN, FNP-C Internal medicine

## 2023-06-12 ENCOUNTER — Inpatient Hospital Stay (HOSPITAL_BASED_OUTPATIENT_CLINIC_OR_DEPARTMENT_OTHER): Payer: Self-pay | Admitting: Oncology

## 2023-06-12 ENCOUNTER — Encounter: Payer: Self-pay | Admitting: Oncology

## 2023-06-12 ENCOUNTER — Inpatient Hospital Stay: Payer: Self-pay | Attending: Oncology

## 2023-06-12 VITALS — BP 134/77 | HR 68 | Temp 97.8°F | Resp 19 | Ht 76.0 in | Wt 239.9 lb

## 2023-06-12 DIAGNOSIS — Z85038 Personal history of other malignant neoplasm of large intestine: Secondary | ICD-10-CM

## 2023-06-12 DIAGNOSIS — R970 Elevated carcinoembryonic antigen [CEA]: Secondary | ICD-10-CM | POA: Insufficient documentation

## 2023-06-12 DIAGNOSIS — Z08 Encounter for follow-up examination after completed treatment for malignant neoplasm: Secondary | ICD-10-CM | POA: Diagnosis not present

## 2023-06-12 DIAGNOSIS — Z87891 Personal history of nicotine dependence: Secondary | ICD-10-CM | POA: Diagnosis not present

## 2023-06-12 DIAGNOSIS — Z79899 Other long term (current) drug therapy: Secondary | ICD-10-CM | POA: Diagnosis not present

## 2023-06-12 DIAGNOSIS — Z9221 Personal history of antineoplastic chemotherapy: Secondary | ICD-10-CM | POA: Insufficient documentation

## 2023-06-12 LAB — CBC WITH DIFFERENTIAL (CANCER CENTER ONLY)
Abs Immature Granulocytes: 0.03 10*3/uL (ref 0.00–0.07)
Basophils Absolute: 0 10*3/uL (ref 0.0–0.1)
Basophils Relative: 1 %
Eosinophils Absolute: 0.1 10*3/uL (ref 0.0–0.5)
Eosinophils Relative: 1 %
HCT: 39.6 % (ref 39.0–52.0)
Hemoglobin: 12.7 g/dL — ABNORMAL LOW (ref 13.0–17.0)
Immature Granulocytes: 1 %
Lymphocytes Relative: 16 %
Lymphs Abs: 1.1 10*3/uL (ref 0.7–4.0)
MCH: 31.9 pg (ref 26.0–34.0)
MCHC: 32.1 g/dL (ref 30.0–36.0)
MCV: 99.5 fL (ref 80.0–100.0)
Monocytes Absolute: 0.3 10*3/uL (ref 0.1–1.0)
Monocytes Relative: 5 %
Neutro Abs: 4.9 10*3/uL (ref 1.7–7.7)
Neutrophils Relative %: 76 %
Platelet Count: 129 10*3/uL — ABNORMAL LOW (ref 150–400)
RBC: 3.98 MIL/uL — ABNORMAL LOW (ref 4.22–5.81)
RDW: 12.8 % (ref 11.5–15.5)
WBC Count: 6.4 10*3/uL (ref 4.0–10.5)
nRBC: 0 % (ref 0.0–0.2)

## 2023-06-12 LAB — CMP (CANCER CENTER ONLY)
ALT: 18 U/L (ref 0–44)
AST: 17 U/L (ref 15–41)
Albumin: 4.4 g/dL (ref 3.5–5.0)
Alkaline Phosphatase: 61 U/L (ref 38–126)
Anion gap: 7 (ref 5–15)
BUN: 24 mg/dL — ABNORMAL HIGH (ref 8–23)
CO2: 26 mmol/L (ref 22–32)
Calcium: 9.1 mg/dL (ref 8.9–10.3)
Chloride: 101 mmol/L (ref 98–111)
Creatinine: 0.93 mg/dL (ref 0.61–1.24)
GFR, Estimated: >60 mL/min
Glucose, Bld: 111 mg/dL — ABNORMAL HIGH (ref 70–99)
Potassium: 4.1 mmol/L (ref 3.5–5.1)
Sodium: 134 mmol/L — ABNORMAL LOW (ref 135–145)
Total Bilirubin: 0.6 mg/dL (ref 0.0–1.2)
Total Protein: 6.9 g/dL (ref 6.5–8.1)

## 2023-06-12 NOTE — Progress Notes (Signed)
 Hematology/Oncology Consult note [facility]  [phone] [phone]  Patient Care Team: Sallyanne Kuster, NP as PCP - General (Nurse Practitioner) Iran Ouch, MD as PCP - Cardiology (Cardiology) Benita Gutter, RN as Oncology Nurse Navigator Pabon, Merri Ray, MD as Consulting Physician (General Surgery) Creig Hines, MD as Consulting Physician (Oncology)   Name of the patient: Keith Trujillo  [id number]  1961/01/15   Date of visit: 06/12/23  Diagnosis- stage IIIb colon adenocarcinoma currently in remission   Chief complaint/ Reason for visit-routine follow-up of colon cancer  Heme/Onc history: patient is a 63 year old male who was seen by Dr. Servando Snare from GI for symptoms of diarrhea as well as abdominal pain.He underwent a colonoscopy on 01/02/2019 which showed an ulcerated partially obstructing large mass in the sigmoid colon which measured 10 cm in length biopsy showed adenocarcinoma moderately differentiated.  Baseline CEA elevated at 71.7.   CT abdomen pelvis with contrast showed irregular marked circumferential wall thickening in the sigmoid colon near the rectosigmoid junction.  The lesion longitudinally extends for approximately 5 to 6 cm.  4.1 x 2.8 cm ill-defined irregular paracolonic soft tissue mass may represent direct tumor extension or contiguous markedly enlarged lymph node.  Abnormal lymph nodes also seen in the perirectal and presacral space concerning for metastatic disease.  1.5 cm ill-defined hypoattenuating lesion in the medial segment of the left liver which may be focal fatty deposition but cannot be definitively characterized.  MRI did not show any evidence of liver metastases.  CT chest was negative for metastatic disease.   Patient underwent a low anterior resection of the sigmoid mass.  Final pathology showed 8 cm invasive colorectal adenocarcinoma grade 2.  Tumor invades through the muscularis propria into  pericolorectal tissue.  All margins are uninvolvedLymphovascular invasion and perineural invasion present.  Lymph nodes 1+ out of 20 PT3PN1A.  MSI stable   Patient completed 12 cycles of adjuvant FOLFOX chemotherapy on 09/16/2019  Interval history-patient is doing well overall and denies any changes in his appetite.  He has gained weight over the last 6 months.  Denies any changes in his bowel habits.  Denies any blood in stools  ECOG PS- 0 Pain scale- 0   Review of systems- Review of Systems  Constitutional:  Negative for chills, fever, malaise/fatigue and weight loss.  HENT:  Negative for congestion, ear discharge and nosebleeds.   Eyes:  Negative for blurred vision.  Respiratory:  Negative for cough, hemoptysis, sputum production, shortness of breath and wheezing.   Cardiovascular:  Negative for chest pain, palpitations, orthopnea and claudication.  Gastrointestinal:  Negative for abdominal pain, blood in stool, constipation, diarrhea, heartburn, melena, nausea and vomiting.  Genitourinary:  Negative for dysuria, flank pain, frequency, hematuria and urgency.  Musculoskeletal:  Negative for back pain, joint pain and myalgias.  Skin:  Negative for rash.  Neurological:  Negative for dizziness, tingling, focal weakness, seizures, weakness and headaches.  Endo/Heme/Allergies:  Does not bruise/bleed easily.  Psychiatric/Behavioral:  Negative for depression and suicidal ideas. The patient does not have insomnia.       Allergies  Allergen Reactions   Sulfa Antibiotics Rash     Past Medical History:  Diagnosis Date   Abdominal pain    Allergy    Atrial flutter (HCC)    Cancer (HCC)    Cellulitis    Colon cancer (HCC)    Dyspnea    Liver spot    Loose stools    Weight  loss      Past Surgical History:  Procedure Laterality Date   CATARACT EXTRACTION W/PHACO Left 02/08/2022   Procedure: CATARACT EXTRACTION PHACO AND INTRAOCULAR LENS PLACEMENT (IOC) LEFT  9.40  01:15.0;   Surgeon: Lockie Mola, MD;  Location: [facility];  Service: Ophthalmology;  Laterality: Left;   COLONOSCOPY WITH PROPOFOL N/A 01/02/2019   Procedure: COLONOSCOPY WITH BIOPSY;  Surgeon: Midge Minium, MD;  Location: [facility];  Service: Endoscopy;  Laterality: N/A;  Clip x2 at Biopsy Site   COLONOSCOPY WITH PROPOFOL N/A 10/29/2020   Procedure: COLONOSCOPY WITH PROPOFOL;  Surgeon: Midge Minium, MD;  Location: [facility];  Service: Endoscopy;  Laterality: N/A;   COLOSTOMY Right 02/06/2019   Procedure: COLOSTOMY;  Surgeon: Leafy Ro, MD;  Location: [facility];  Service: General;  Laterality: Right;   excision of skin lesion     FLEXIBLE SIGMOIDOSCOPY N/A 10/23/2019   Procedure: FLEXIBLE SIGMOIDOSCOPY WITH BIOPSY and DILATION;  Surgeon: Midge Minium, MD;  Location: [facility];  Service: Endoscopy;  Laterality: N/A;  needs port accessed   ILEOSTOMY CLOSURE N/A 01/13/2020   Procedure: ILEOSTOMY TAKEDOWN;  Surgeon: Leafy Ro, MD;  Location: [facility];  Service: General;  Laterality: N/A;   LAPAROSCOPIC SIGMOID COLECTOMY N/A 02/06/2019   Procedure: LAPAROSCOPIC SIGMOID COLECTOMY converted to open procedure;  Surgeon: Leafy Ro, MD;  Location: [facility];  Service: General;  Laterality: N/A;   POLYPECTOMY N/A 10/23/2019   Procedure: POLYPECTOMY;  Surgeon: Midge Minium, MD;  Location: [facility];  Service: Endoscopy;  Laterality: N/A;   PORT-A-CATH REMOVAL N/A 01/13/2020   Procedure: REMOVAL PORT-A-CATH;  Surgeon: Leafy Ro, MD;  Location: [facility];  Service: General;  Laterality: N/A;   PORTACATH PLACEMENT Right 03/06/2019   Procedure: INSERTION PORT-A-CATH;  Surgeon: Leafy Ro, MD;  Location: [facility];  Service: General;  Laterality: Right;   TONSILLECTOMY AND ADENOIDECTOMY  1969    Social History   Socioeconomic History   Marital status: Married    Spouse name: Not on file   Number of children: Not on file   Years of education:  Not on file   Highest education level: Not on file  Occupational History   Not on file  Tobacco Use   Smoking status: Former    Types: Cigars   Smokeless tobacco: Former    Quit date: 1980  Vaping Use   Vaping status: Never Used  Substance and Sexual Activity   Alcohol use: Yes    Comment: occ.   Drug use: Never   Sexual activity: Yes  Other Topics Concern   Not on file  Social History Narrative   Not on file   Social Drivers of Health   Financial Resource Strain: Not on file  Food Insecurity: Not on file  Transportation Needs: Not on file  Physical Activity: Not on file  Stress: Not on file  Social Connections: Not on file  Intimate Partner Violence: Not on file    Family History  Problem Relation Age of Onset   Heart failure Mother    Lung cancer Father      Current Outpatient Medications:    diltiazem (CARDIZEM) 30 MG tablet, Take 1 tablet (30 mg total) by mouth 3 (three) times daily as needed (palpitations/arrhythmia)., Disp: 60 tablet, Rfl: 1   loratadine (CLARITIN) 10 MG tablet, Take 10 mg by mouth daily., Disp: , Rfl:    Multiple Vitamin (MULTIVITAMIN) tablet, Take 1 tablet by mouth daily. Centrum,  Disp: , Rfl:    Multiple Vitamins-Minerals (PRESERVISION AREDS 2 PO), Take by mouth 2 (two) times daily., Disp: , Rfl:    tadalafil (CIALIS) 5 MG tablet, Take 1 tablet (5 mg total) by mouth daily., Disp: 90 tablet, Rfl: 3  Physical exam: There were no vitals filed for this visit. Physical Exam Cardiovascular:     Rate and Rhythm: Normal rate and regular rhythm.     Heart sounds: Normal heart sounds.  Pulmonary:     Effort: Pulmonary effort is normal.     Breath sounds: Normal breath sounds.  Abdominal:     General: Bowel sounds are normal.     Palpations: Abdomen is soft.  Skin:    General: Skin is warm and dry.  Neurological:     Mental Status: He is alert and oriented to person, place, and time.         Latest Ref Rng & Units 12/12/2022   12:47 PM   CMP  Glucose 70 - 99 mg/dL 956   BUN 8 - 23 mg/dL 20   Creatinine 2.13 - 1.24 mg/dL 0.86   Sodium 578 - 469 mmol/L 138   Potassium 3.5 - 5.1 mmol/L 3.8   Chloride 98 - 111 mmol/L 105   CO2 22 - 32 mmol/L 25   Calcium 8.9 - 10.3 mg/dL 9.2   Total Protein 6.5 - 8.1 g/dL 7.2   Total Bilirubin 0.3 - 1.2 mg/dL 0.7   Alkaline Phos 38 - 126 U/L 58   AST 15 - 41 U/L 23   ALT 0 - 44 U/L 25       Latest Ref Rng & Units 12/12/2022   12:47 PM  CBC  WBC 4.0 - 10.5 K/uL 5.5   Hemoglobin 13.0 - 17.0 g/dL 62.9   Hematocrit 52.8 - 52.0 % 40.2   Platelets 150 - 400 K/uL 125     Assessment and plan- Patient is a 63 y.o. 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  He s/p 12 cycles of adjuvant FOLFOX chemotherapy.  He is here for a routine surveillance visit for colon cancer  We are now nearing 4 years of surveillance from the time he completed adjuvant chemotherapy.We are getting yearly scans at this time.  I will plan to get CT chest abdomen and pelvis with contrast in July 2025.  I will see him back in 6 months with CBC with differential CMP and CEA.  Patient had a colonoscopy in August 2022 and was recommended repeat colonoscopy in 3 years which would be in August of this year.  I will get in touch with Dr. Servando Snare about it Visit Diagnosis 1. Encounter for follow-up surveillance of colon cancer      Dr. Owens Shark, MD, MPH [facility] [phone] 06/12/2023 1:13 PM

## 2023-06-13 LAB — CEA: CEA: 1.8 ng/mL (ref 0.0–4.7)

## 2023-06-14 ENCOUNTER — Encounter: Payer: Self-pay | Admitting: Nurse Practitioner

## 2023-06-18 DIAGNOSIS — Z125 Encounter for screening for malignant neoplasm of prostate: Secondary | ICD-10-CM | POA: Diagnosis not present

## 2023-06-18 DIAGNOSIS — E782 Mixed hyperlipidemia: Secondary | ICD-10-CM | POA: Diagnosis not present

## 2023-06-19 LAB — PSA TOTAL (REFLEX TO FREE): Prostate Specific Ag, Serum: 0.9 ng/mL (ref 0.0–4.0)

## 2023-06-19 LAB — LIPID PANEL
Chol/HDL Ratio: 2.8 ratio (ref 0.0–5.0)
Cholesterol, Total: 176 mg/dL (ref 100–199)
HDL: 62 mg/dL (ref >39)
LDL Chol Calc (NIH): 97 mg/dL (ref 0–99)
Triglycerides: 93 mg/dL (ref 0–149)
VLDL Cholesterol Cal: 17 mg/dL (ref 5–40)

## 2023-06-19 NOTE — Progress Notes (Signed)
 PSA and lipid panel were both normal. Will repeat labs next year

## 2023-06-21 ENCOUNTER — Telehealth: Payer: Self-pay

## 2023-06-21 NOTE — Telephone Encounter (Signed)
 Patient notified

## 2023-06-21 NOTE — Telephone Encounter (Signed)
 Left message for patient to give office a call back.

## 2023-06-21 NOTE — Telephone Encounter (Signed)
-----   Message from Sallyanne Kuster sent at 06/19/2023  9:37 PM EDT ----- PSA and lipid panel were both normal. Will repeat labs next year

## 2023-08-01 ENCOUNTER — Telehealth: Payer: Self-pay | Admitting: Oncology

## 2023-08-01 NOTE — Telephone Encounter (Signed)
 Pt has new insurance and stated he needs a referral from Dr.Rao to a place [place] that [place] insurance would cover. He now has [facility] [facility]. Pt contact phone number is correct. Confirmed with pt.   Pt stated he did not know the name of the place in [place]. He stated that Arlene Ben has told him earlier today.

## 2023-08-01 NOTE — Telephone Encounter (Signed)
 Please refer him to [facility] [place]. Once he gets that appt, cancel ours

## 2023-08-02 ENCOUNTER — Telehealth: Payer: Self-pay

## 2023-08-02 ENCOUNTER — Other Ambulatory Visit: Payer: Self-pay

## 2023-08-02 DIAGNOSIS — C187 Malignant neoplasm of sigmoid colon: Secondary | ICD-10-CM

## 2023-08-02 NOTE — Telephone Encounter (Signed)
 Per Dr. Randy Buttery "Please refer him to [facility] [place]. Once he gets that appt, cancel ours".  Called [facility] and faxed to their referral department; fax confirmation received.  Will follow up with next week to inquire if patient has been scheduled.

## 2023-08-16 NOTE — Telephone Encounter (Signed)
 Outbound call to [facility] to confirm patient has established an appointment with them; if so, to cancel his appointment with us .  Spoke to [facility] who confirmed he's scheduled Monday 09/03/23 @ 3PM.  Scheduling can you please cancel upcoming lab / MD appointment on 12/12/23 please.

## 2023-09-06 NOTE — Telephone Encounter (Signed)
 F/u on referral to [facility], pt was seen by Dr. Lorella Roles on 09/03/23.

## 2023-10-03 ENCOUNTER — Ambulatory Visit

## 2023-10-10 ENCOUNTER — Other Ambulatory Visit

## 2023-12-12 ENCOUNTER — Ambulatory Visit: Admitting: Oncology

## 2023-12-12 ENCOUNTER — Other Ambulatory Visit

## 2024-06-11 ENCOUNTER — Encounter: Payer: Self-pay | Admitting: Nurse Practitioner
